# Patient Record
Sex: Female | Born: 1965 | Race: White | Hispanic: No | Smoking: Former smoker
Health system: Southern US, Community
[De-identification: ages and names within clinical notes are randomized; demographics above are authoritative.]

## PROBLEM LIST (undated history)

## (undated) DIAGNOSIS — F32A Depression, unspecified: Secondary | ICD-10-CM

## (undated) DIAGNOSIS — M1990 Unspecified osteoarthritis, unspecified site: Secondary | ICD-10-CM

## (undated) DIAGNOSIS — T4145XA Adverse effect of unspecified anesthetic, initial encounter: Secondary | ICD-10-CM

## (undated) DIAGNOSIS — I10 Essential (primary) hypertension: Secondary | ICD-10-CM

## (undated) DIAGNOSIS — Q185 Microstomia: Secondary | ICD-10-CM

## (undated) DIAGNOSIS — F419 Anxiety disorder, unspecified: Secondary | ICD-10-CM

## (undated) DIAGNOSIS — R21 Rash and other nonspecific skin eruption: Secondary | ICD-10-CM

## (undated) DIAGNOSIS — J3489 Other specified disorders of nose and nasal sinuses: Secondary | ICD-10-CM

## (undated) DIAGNOSIS — F909 Attention-deficit hyperactivity disorder, unspecified type: Secondary | ICD-10-CM

## (undated) DIAGNOSIS — Z8679 Personal history of other diseases of the circulatory system: Secondary | ICD-10-CM

## (undated) DIAGNOSIS — G2581 Restless legs syndrome: Secondary | ICD-10-CM

## (undated) DIAGNOSIS — T8859XA Other complications of anesthesia, initial encounter: Secondary | ICD-10-CM

## (undated) DIAGNOSIS — J320 Chronic maxillary sinusitis: Secondary | ICD-10-CM

## (undated) DIAGNOSIS — J322 Chronic ethmoidal sinusitis: Secondary | ICD-10-CM

## (undated) DIAGNOSIS — H548 Legal blindness, as defined in USA: Secondary | ICD-10-CM

## (undated) DIAGNOSIS — K5900 Constipation, unspecified: Secondary | ICD-10-CM

## (undated) DIAGNOSIS — K219 Gastro-esophageal reflux disease without esophagitis: Secondary | ICD-10-CM

## (undated) DIAGNOSIS — J343 Hypertrophy of nasal turbinates: Secondary | ICD-10-CM

## (undated) DIAGNOSIS — J342 Deviated nasal septum: Secondary | ICD-10-CM

## (undated) DIAGNOSIS — F329 Major depressive disorder, single episode, unspecified: Secondary | ICD-10-CM

## (undated) HISTORY — PX: INTRAOCULAR LENS INSERTION: SHX110

## (undated) HISTORY — PX: ABLATION ON ENDOMETRIOSIS: SHX5787

## (undated) HISTORY — PX: CATARACT EXTRACTION, BILATERAL: SHX1313

## (undated) HISTORY — PX: PARS PLANA VITRECTOMY: SHX2166

## (undated) HISTORY — PX: EYE SURGERY: SHX253

## (undated) HISTORY — DX: Essential (primary) hypertension: I10

## (undated) HISTORY — PX: TUBAL LIGATION: SHX77

## (undated) HISTORY — DX: Anxiety disorder, unspecified: F41.9

## (undated) HISTORY — DX: Gastro-esophageal reflux disease without esophagitis: K21.9

## (undated) HISTORY — DX: Attention-deficit hyperactivity disorder, unspecified type: F90.9

## (undated) HISTORY — PX: MEMBRANE PEEL: SHX5967

## (undated) HISTORY — PX: PLANTAR FASCIA RELEASE: SHX2239

---

## 2002-07-06 ENCOUNTER — Encounter: Payer: Self-pay | Admitting: Family Medicine

## 2002-07-06 ENCOUNTER — Ambulatory Visit (HOSPITAL_COMMUNITY): Admission: RE | Admit: 2002-07-06 | Discharge: 2002-07-06 | Payer: Self-pay | Admitting: Family Medicine

## 2003-01-20 ENCOUNTER — Encounter (HOSPITAL_COMMUNITY): Admission: RE | Admit: 2003-01-20 | Discharge: 2003-02-19 | Payer: Self-pay | Admitting: Cardiology

## 2003-12-26 ENCOUNTER — Ambulatory Visit (HOSPITAL_COMMUNITY): Admission: RE | Admit: 2003-12-26 | Discharge: 2003-12-26 | Payer: Self-pay | Admitting: Family Medicine

## 2004-01-31 ENCOUNTER — Ambulatory Visit (HOSPITAL_COMMUNITY): Admission: RE | Admit: 2004-01-31 | Discharge: 2004-01-31 | Payer: Self-pay | Admitting: Orthopedic Surgery

## 2004-03-22 ENCOUNTER — Encounter (HOSPITAL_COMMUNITY): Admission: RE | Admit: 2004-03-22 | Discharge: 2004-04-21 | Payer: Self-pay | Admitting: Neurosurgery

## 2004-07-05 ENCOUNTER — Inpatient Hospital Stay (HOSPITAL_COMMUNITY): Admission: RE | Admit: 2004-07-05 | Discharge: 2004-07-09 | Payer: Self-pay | Admitting: Neurosurgery

## 2004-07-05 HISTORY — PX: LUMBAR LAMINECTOMY: SHX95

## 2004-07-05 HISTORY — PX: LUMBAR FUSION: SHX111

## 2004-10-31 ENCOUNTER — Encounter (HOSPITAL_COMMUNITY): Admission: RE | Admit: 2004-10-31 | Discharge: 2004-11-30 | Payer: Self-pay | Admitting: Neurosurgery

## 2006-06-25 ENCOUNTER — Ambulatory Visit (HOSPITAL_COMMUNITY): Admission: RE | Admit: 2006-06-25 | Discharge: 2006-06-25 | Payer: Self-pay | Admitting: Family Medicine

## 2006-11-21 ENCOUNTER — Ambulatory Visit (HOSPITAL_COMMUNITY): Admission: RE | Admit: 2006-11-21 | Discharge: 2006-11-21 | Payer: Self-pay | Admitting: Family Medicine

## 2006-11-23 ENCOUNTER — Emergency Department (HOSPITAL_COMMUNITY): Admission: EM | Admit: 2006-11-23 | Discharge: 2006-11-23 | Payer: Self-pay | Admitting: Emergency Medicine

## 2007-04-01 ENCOUNTER — Ambulatory Visit (HOSPITAL_COMMUNITY): Admission: RE | Admit: 2007-04-01 | Discharge: 2007-04-01 | Payer: Self-pay | Admitting: Family Medicine

## 2007-04-07 ENCOUNTER — Ambulatory Visit (HOSPITAL_COMMUNITY): Admission: RE | Admit: 2007-04-07 | Discharge: 2007-04-07 | Payer: Self-pay | Admitting: Family Medicine

## 2007-04-13 ENCOUNTER — Ambulatory Visit: Payer: Self-pay | Admitting: Orthopedic Surgery

## 2007-09-23 ENCOUNTER — Ambulatory Visit (HOSPITAL_COMMUNITY): Admission: RE | Admit: 2007-09-23 | Discharge: 2007-09-23 | Payer: Self-pay | Admitting: Family Medicine

## 2007-10-27 ENCOUNTER — Ambulatory Visit (HOSPITAL_COMMUNITY): Admission: RE | Admit: 2007-10-27 | Discharge: 2007-10-27 | Payer: Self-pay | Admitting: Family Medicine

## 2008-08-25 ENCOUNTER — Emergency Department (HOSPITAL_COMMUNITY): Admission: EM | Admit: 2008-08-25 | Discharge: 2008-08-25 | Payer: Self-pay | Admitting: Emergency Medicine

## 2008-08-31 ENCOUNTER — Ambulatory Visit (HOSPITAL_COMMUNITY): Admission: RE | Admit: 2008-08-31 | Discharge: 2008-08-31 | Payer: Self-pay | Admitting: Family Medicine

## 2008-10-03 ENCOUNTER — Ambulatory Visit: Payer: Self-pay | Admitting: Gynecology

## 2008-10-03 ENCOUNTER — Other Ambulatory Visit: Admission: RE | Admit: 2008-10-03 | Discharge: 2008-10-03 | Payer: Self-pay | Admitting: Gynecology

## 2008-10-03 ENCOUNTER — Encounter: Payer: Self-pay | Admitting: Gynecology

## 2008-10-10 ENCOUNTER — Ambulatory Visit: Payer: Self-pay | Admitting: Gynecology

## 2008-10-10 ENCOUNTER — Encounter: Payer: Self-pay | Admitting: Gynecology

## 2008-11-10 ENCOUNTER — Ambulatory Visit: Payer: Self-pay | Admitting: Gynecology

## 2008-11-11 ENCOUNTER — Ambulatory Visit: Payer: Self-pay | Admitting: Gynecology

## 2008-12-30 ENCOUNTER — Ambulatory Visit: Payer: Self-pay | Admitting: Gynecology

## 2009-12-14 ENCOUNTER — Ambulatory Visit (HOSPITAL_COMMUNITY): Admission: RE | Admit: 2009-12-14 | Discharge: 2009-12-14 | Payer: Self-pay | Admitting: Family Medicine

## 2011-01-16 ENCOUNTER — Emergency Department (HOSPITAL_COMMUNITY)
Admission: EM | Admit: 2011-01-16 | Discharge: 2011-01-16 | Disposition: A | Discharge status: Home / Self Care | Payer: BC Managed Care – PPO | Attending: Emergency Medicine | Admitting: Emergency Medicine

## 2011-01-16 DIAGNOSIS — H169 Unspecified keratitis: Secondary | ICD-10-CM | POA: Insufficient documentation

## 2011-01-16 DIAGNOSIS — I10 Essential (primary) hypertension: Secondary | ICD-10-CM | POA: Insufficient documentation

## 2011-01-16 DIAGNOSIS — H5710 Ocular pain, unspecified eye: Secondary | ICD-10-CM | POA: Insufficient documentation

## 2011-03-15 NOTE — H&P (Signed)
NAME:  Sara Barnett, Sara Barnett                       ACCOUNT NO.:  [id number]   MEDICAL RECORD NO.:  [id number]                   PATIENT TYPE:  INP   LOCATION:  3006                                 FACILITY:  [facility]   PHYSICIAN:  Clydene Fake, M.D.               DATE OF BIRTH:  18-Feb-1966   DATE OF ADMISSION:  07/05/2004  DATE OF DISCHARGE:                                HISTORY & PHYSICAL   CHIEF COMPLAINT:  Back and right leg pain.   HISTORY:  The patient is a 45 year old right handed woman who has been  having back and right leg pain off and on for sometime but over the last few  months has been constant and more severe with pain radiating down behind the  knee towards the calf.  No numbness or weakness.  Activity makes this worse.  It is a lot worse in the morning.  Sitting makes things worse.  X-rays and  MRI were done showing grade 1-2 spondylolisthesis at L5-S1 that does move  between flexion and extension with some central stenosis due to biforaminal  narrowing, worse on the right side.  The patient was admitted for  decompression and fusion of the lumbar spine.   PAST MEDICAL HISTORY:  Significant for hypertension, no previous operations.   ALLERGIES:  No known drug allergies.   MEDICATIONS:  Diovan p.r.n.   SOCIAL HISTORY:  She smokes a pack a day, quit a couple of months ago, but  has an occasional cigarette.  Alcohol use socially.   REVIEW OF SYMPTOMS:  Otherwise, negative.   FAMILY HISTORY:  Noncontributory.   PHYSICAL EXAMINATION:  HEENT:  Within normal limits.  NECK:  Supple.  LUNGS:  Clear.  HEART:  Regular rate and rhythm.  ABDOMEN:  Soft, nontender.  EXTREMITIES:  Intact.  BACK AND NEUROLOGICAL:  Negative straight leg raising.  Strength intact.  Sensation intact.  Gait normal.  Reflexes symmetric.   ASSESSMENT AND PLAN:  Patient with unstable spondylolisthesis with back and  leg pain, patient is brought in for decompression and fusion of L5-S1.                                           Clydene Fake, M.D.   JRH/MEDQ  D:  07/05/2004  T:  07/05/2004  Job:  [id number]

## 2011-03-15 NOTE — Op Note (Signed)
Sara Barnett, Sara Barnett                       ACCOUNT NO.:  [id number]   MEDICAL RECORD NO.:  [id number]                   PATIENT TYPE:  INP   LOCATION:  2873                                 FACILITY:  [facility]   PHYSICIAN:  Clydene Fake, M.D.               DATE OF BIRTH:  1966/04/27   DATE OF PROCEDURE:  07/05/2004  DATE OF DISCHARGE:                                 OPERATIVE REPORT   PREOPERATIVE DIAGNOSES:  Unstable spondylolisthesis at L5-S1 with lateral  recess stenosis and a pars defect.   POSTOPERATIVE DIAGNOSES:  Unstable spondylolisthesis at L5-S1 with lateral  recess stenosis and a pars defect.   PROCEDURE:  Gill decompressive laminectomy at L5-S1 with posterior lumbar  interbody fusion at L5-S1 with branding and interbody cages at L5-S1,  nonsegmented pedicle screw fixation with medium screws at L5-S1, posterior  lateral fusion at L5-S1, autograft at same incision, allograft, Symphony.   SURGEON:  Clydene Fake, M.D.   ASSISTANT:  Payton Doughty, M.D.   ANESTHESIA:  General endotracheal tube anesthesia.   ESTIMATED BLOOD LOSS:  Was 200 mL.   BLOOD REPLACED:  None.   DRAINS:  None.   COMPLICATIONS:  None.   INDICATIONS FOR PROCEDURE:  The patient is a 45 year old woman who has been  having low back and right leg pain.  Over the last few months it has been  more constant.  MRI and x-rays done showing unstable spondylolisthesis at L5-  S1 and lateral recess stenosis, worse on the right.  She is brought in for a  decompression and fusion.   DESCRIPTION OF PROCEDURE:  The patient is brought to the operating room and  general anesthesia was induced.  The patient was placed in the prone  position on the Wilson frame with all pressure points padded.  The patient  was prepped and draped in a sterile fashion.  The __________ was injected  with 20 mL of 1% Lidocaine with epinephrine.  An incision was then made in  the midline lower lumbar sacral spine.  The incision  was taken down to the  fascia.  Hemostasis was obtained with the Bovie cauterization.  The fascia  was incised over the L5 spinous process.  Immediately we could tell the  spinous process was loose to the lateral pars defect.  A subperiosteal  dissection was done bilaterally, at the S1, L5 and L4 spinous processes and  lamina, up to the facets and the L5 transverse process and lateral sacrum  were exposed.  A self-retaining retractor was placed and fluoroscopy was  used to confirm our positioning.  A laminectomy and fasciectomy and pars  resection were then performed and all the bone removed.  It was chopped up  into small pieces, after which it was cleaned and saved for later use in  fusion.  Again, bilateral fasciectomizes were performed and the bilateral  pars were decompressed.  There was significant spondylitic  change beneath  the pars compression of the L5 roots, and this was all removed.  We  decompressed all the way up to the L5 pedicle, following the nerve root out  to make sure the L5 was decompressed along with the central canal and the S1  roots.  Hemostasis in the epidural spaces was obtained with bipolar  cauterization.  The disk space was explored and the disk space was incised  with a #15 blade and a diskectomy performed with pituitary rongeurs.  We  distracted the interspace up to 9 mm.  This reduced some of the  spondylolisthesis, and we started preparing the interspace for an interbody  fusion.  The various scrapers and broaches were used to prepare the  interspace, removing the cartilagineus end plate, and used curets to curet  out the interspace.  Our  holding distractor __________ until we packed  autograft bone that was mixed with the Symphony system for the platelet-rich  plasma, and we packed that into the interbody space.  We then tapped a 9 x 9  Browning cage into position.  The distractor was removed from the  ipsilateral side and the interbody space packed with  bone and then another 9  x 9 Browning cage tapped into place there.  Both were countersunk a couple  of millimeters.  Again explored the nerve root.  We had a good decompression  and better alignment of the spine.  The transverse process of the L5 facet  and the lateral sacrum were then decorticated with a high-speed drill.  _________ using the transverse process at the facet junction, and visual  inspection of the pedicle both at L5 and S1.  The pedicle was decorticated  with the high-speed drill.  A pedicle probe was placed on the pedicle using  fluoroscopy as a guide, and probed the pedicle with a small ball probe, and  entered the bony circumference, tapped the pedicle again, and checked the  whole anterior bony circumference around it, and then placed a pedicle  screw.  This was done bilaterally at L5 and S1.  The 45 mm screws were used  at L5, 40 mm screws in the right-sided S1 and a 3.5 mm screw used at the  left-sided S1.  Grafts were placed in the screw heads.  Locking nuts were  placed and tightened down at L5 and there was noted some slight compression  over the interspace.  Tightened down the S1 screw bilaterally.  Apical  fluoroscopic images were obtained and found to be in good position of the  cages and pedicle screws and rods, and good improved alignment of the spine.  Irrigation was used.  The nerve roots was again explored.  Gelfoam was  placed over the nerve roots to make sure that no bone graft would fall on  them and compress them and then the rest of the autograft and the allograft  bone that was placed at the Symphony system was then packed into the  posterolateral gutters for a posterolateral fusion at L5-S1 bilaterally.  The retractors were removed.  The fascia was closed with over and over  Vicryl interrupted sutures.  The subcutaneous tissue was closed with #0 and  #2-0 and #3-0 Vicryl interrupted sutures.  The skin was closed with Benzoin and Steri-Strips.  A  dressing was placed.  The patient was placed back into  the supine position.  The patient was sent to the recovery room in stable condition.  Clydene Fake, M.D.    JRH/MEDQ  D:  07/05/2004  T:  07/05/2004  Job:  [id number]

## 2011-03-15 NOTE — Discharge Summary (Signed)
Sara Barnett, SOW NO.:  [id number]   MEDICAL RECORD NO.:  [id number]          PATIENT TYPE:  INP   LOCATION:  3006                         FACILITY:  [facility]   PHYSICIAN:  Clydene Fake, M.D.  DATE OF BIRTH:  1966-06-21   DATE OF ADMISSION:  07/05/2004  DATE OF DISCHARGE:  07/09/2004                                 DISCHARGE SUMMARY   DIAGNOSES:  Unstable spondylolisthesis L5-S1, recess stenosis, pars defect.   DISCHARGE DIAGNOSES:  Unstable spondylolisthesis L5-S1, recess stenosis,  pars defect.   PROCEDURE:  Gill decompression L5-S1 with posterior lumbar interbody fusion  L5-S1 with interbody Brantigan cages and nonsegmented screw fixation with  Expedient  system at L5-S1 with posterior lumbar interbody fusion L5-S1 with  autograft allograft and Symphony.   REASON FOR ADMISSION:  The patient is a 45 year old woman who starting  having back and right leg pain for some time, over the last few months with  more severe pain.  Subsequently MRI was done showing __________ L5-S1 that  does move between flexion and extension causing stenosis and bi-foraminal  narrowing.  Patient brought in for decompression and fusion.   HOSPITAL COURSE:  The patient was admitted on the day of surgery and  underwent procedure above without complications.  Postoperatively, the  patient was transferred to recovery room and then to the floor where she was  doing well and started ambulating.  Due to some spasm in her legs and due to  some nausousness the first day or so, but she has started to increase her  activity on September 10.  PCA was discontinued, and she was started on p.o.  pain medications, continued to increase ambulation and treated spasm with  frequent Valium.  As things improved and she was ambulating well, incisions  clean, dry, and intact, starting to eat better, not nauseous, ambulating  well, the apt was discharged home on July 09, 2004, in stable   condition.   DISCHARGE MEDICATIONS:  Same as before hospitalization plus Percocet,  Valium, and Flexeril p.r.n. pain.   FOLLOW UP:  In my office in three to four weeks.   ACTIVITY:  No strenuous activity, brace when up.   DIET:  As tolerated.       JRH/MEDQ  D:  07/26/2004  T:  07/26/2004  Job:  [id number]

## 2011-07-29 LAB — DIFFERENTIAL
Monocytes Relative: 5
Neutrophils Relative %: 67

## 2011-07-29 LAB — ACETAMINOPHEN LEVEL: Acetaminophen (Tylenol), Serum: <10 — ABNORMAL LOW

## 2011-07-29 LAB — URINALYSIS, ROUTINE W REFLEX MICROSCOPIC
Glucose, UA: NEGATIVE
Ketones, ur: NEGATIVE
Leukocytes, UA: NEGATIVE
Nitrite: NEGATIVE
Specific Gravity, Urine: 1.025
Urobilinogen, UA: 0.2
pH: 6

## 2011-07-29 LAB — CBC
Hemoglobin: 14.6
MCV: 89.7
WBC: 5.9

## 2011-07-29 LAB — RAPID URINE DRUG SCREEN, HOSP PERFORMED
Cocaine: NOT DETECTED
Opiates: NOT DETECTED
Tetrahydrocannabinol: NOT DETECTED

## 2011-07-29 LAB — BASIC METABOLIC PANEL
Chloride: 106
GFR calc Af Amer: >60
Glucose, Bld: 101 — ABNORMAL HIGH

## 2011-07-29 LAB — URINE MICROSCOPIC-ADD ON

## 2011-07-29 LAB — SALICYLATE LEVEL: Salicylate Lvl: <4

## 2011-08-13 DIAGNOSIS — Z8719 Personal history of other diseases of the digestive system: Secondary | ICD-10-CM | POA: Insufficient documentation

## 2011-08-13 DIAGNOSIS — Z8669 Personal history of other diseases of the nervous system and sense organs: Secondary | ICD-10-CM | POA: Insufficient documentation

## 2011-08-13 DIAGNOSIS — I10 Essential (primary) hypertension: Secondary | ICD-10-CM | POA: Insufficient documentation

## 2011-09-26 DIAGNOSIS — H4050X0 Glaucoma secondary to other eye disorders, unspecified eye, stage unspecified: Secondary | ICD-10-CM | POA: Insufficient documentation

## 2011-09-30 DIAGNOSIS — H35359 Cystoid macular degeneration, unspecified eye: Secondary | ICD-10-CM | POA: Insufficient documentation

## 2011-10-05 ENCOUNTER — Ambulatory Visit (HOSPITAL_COMMUNITY)
Admission: RE | Admit: 2011-10-05 | Discharge: 2011-10-05 | Disposition: A | Discharge status: Home / Self Care | Payer: BC Managed Care – PPO | Source: Ambulatory Visit | Attending: Family Medicine | Admitting: Family Medicine

## 2011-10-05 ENCOUNTER — Other Ambulatory Visit (HOSPITAL_COMMUNITY): Payer: Self-pay | Admitting: Family Medicine

## 2011-10-05 DIAGNOSIS — R05 Cough: Secondary | ICD-10-CM | POA: Insufficient documentation

## 2011-10-05 DIAGNOSIS — R0602 Shortness of breath: Secondary | ICD-10-CM

## 2011-10-05 DIAGNOSIS — R635 Abnormal weight gain: Secondary | ICD-10-CM | POA: Insufficient documentation

## 2011-10-05 DIAGNOSIS — R059 Cough, unspecified: Secondary | ICD-10-CM | POA: Insufficient documentation

## 2011-11-13 DIAGNOSIS — H31401 Unspecified choroidal detachment, right eye: Secondary | ICD-10-CM | POA: Insufficient documentation

## 2011-12-18 ENCOUNTER — Emergency Department (HOSPITAL_COMMUNITY): Payer: Managed Care, Other (non HMO)

## 2011-12-18 ENCOUNTER — Other Ambulatory Visit: Payer: Self-pay

## 2011-12-18 ENCOUNTER — Emergency Department (HOSPITAL_COMMUNITY)
Admission: EM | Admit: 2011-12-18 | Discharge: 2011-12-18 | Disposition: A | Discharge status: Home / Self Care | Payer: Managed Care, Other (non HMO) | Attending: Emergency Medicine | Admitting: Emergency Medicine

## 2011-12-18 ENCOUNTER — Encounter (HOSPITAL_COMMUNITY): Payer: Self-pay | Admitting: *Deleted

## 2011-12-18 DIAGNOSIS — H4311 Vitreous hemorrhage, right eye: Secondary | ICD-10-CM

## 2011-12-18 DIAGNOSIS — H4310 Vitreous hemorrhage, unspecified eye: Secondary | ICD-10-CM | POA: Insufficient documentation

## 2011-12-18 DIAGNOSIS — R6884 Jaw pain: Secondary | ICD-10-CM | POA: Insufficient documentation

## 2011-12-18 DIAGNOSIS — R110 Nausea: Secondary | ICD-10-CM | POA: Insufficient documentation

## 2011-12-18 DIAGNOSIS — I10 Essential (primary) hypertension: Secondary | ICD-10-CM | POA: Insufficient documentation

## 2011-12-18 DIAGNOSIS — R079 Chest pain, unspecified: Secondary | ICD-10-CM | POA: Insufficient documentation

## 2011-12-18 HISTORY — DX: Essential (primary) hypertension: I10

## 2011-12-18 LAB — CBC
HCT: 46.2 % — ABNORMAL HIGH (ref 36.0–46.0)
MCH: 31 pg (ref 26.0–34.0)
MCHC: 34.4 g/dL (ref 30.0–36.0)
MCV: 90.1 fL (ref 78.0–100.0)
RDW: 13.1 % (ref 11.5–15.5)
WBC: 17.2 10*3/uL — ABNORMAL HIGH (ref 4.0–10.5)

## 2011-12-18 LAB — BASIC METABOLIC PANEL
BUN: 13 mg/dL (ref 6–23)
Calcium: 10.2 mg/dL (ref 8.4–10.5)
Chloride: 99 mEq/L (ref 96–112)
Creatinine, Ser: 0.72 mg/dL (ref 0.50–1.10)
GFR calc Af Amer: >90 mL/min (ref >90)

## 2011-12-18 MED ORDER — MORPHINE SULFATE 2 MG/ML IJ SOLN
2.0000 mg | Freq: Once | INTRAMUSCULAR | Status: AC
  Administered 2011-12-18: 2 mg via INTRAVENOUS
  Filled 2011-12-18: qty 1

## 2011-12-18 MED ORDER — ONDANSETRON HCL 4 MG/2ML IJ SOLN
4.0000 mg | Freq: Once | INTRAMUSCULAR | Status: AC
  Administered 2011-12-18: 4 mg via INTRAVENOUS
  Filled 2011-12-18: qty 2

## 2011-12-18 MED ORDER — FENTANYL CITRATE 0.05 MG/ML IJ SOLN
50.0000 ug | Freq: Once | INTRAMUSCULAR | Status: AC
  Administered 2011-12-18: 100 ug via INTRAVENOUS
  Filled 2011-12-18: qty 2

## 2011-12-18 MED ORDER — HYDROMORPHONE HCL PF 1 MG/ML IJ SOLN
1.0000 mg | Freq: Once | INTRAMUSCULAR | Status: AC
  Administered 2011-12-18: 1 mg via INTRAVENOUS
  Filled 2011-12-18: qty 1

## 2011-12-18 MED ORDER — HYDROMORPHONE HCL 2 MG PO TABS: 2.0000 mg | ORAL_TABLET | ORAL | Status: AC | PRN

## 2011-12-18 NOTE — ED Notes (Signed)
Pt states CP began this morning, radiating to right jaw. Pain has "eased" but never went away. Pain currently rated at 2.

## 2011-12-18 NOTE — Discharge Instructions (Signed)
Take dilaudid pills as needed for next two days until surgery. Please discuss this with your surgeon and anesthesiologist in case they want to change this medication. Please STOP taking percocet while you are dilaudid.

## 2011-12-18 NOTE — ED Notes (Signed)
Denies Chest pain, only c/o right eye pain 10/10

## 2011-12-18 NOTE — ED Provider Notes (Signed)
History     CSN: [id number]  Arrival date & time 12/18/11  1337   First MD Initiated Contact with Patient 12/18/11 1424      Chief Complaint  Patient presents with  . Chest Pain    (Consider location/radiation/quality/duration/timing/severity/associated sxs/prior treatment) HPI  Patient is a 46 year old woman with recent vitreal hemorrhage in her right eye who presents with central chest pain that began about 10:30 am as well as right upper jaw pain when she was sitting in her recliner. She reports that she took a tums and the pain improved and then returned about 12:30 in the afternoon with much greater severity. She reports nausea at this time but denies shortness of breath or dizziness. She denies pain related to food intake since she had eaten about 2 hours prior to each episode.  She denies any recent exertion since she has been told to avoid all lifting because of her eye bleed. She has severe right eye pain and she is to be taken to surgery on Friday. She was supposed to go last week but was sick with an URI with productive cough, fever and sweats. Her cough has improved and her sputum has cleared since last week. She was taking prednisone and tramadol/percocet daily for her eye pain but she did not take these today due to nausea.  Past Medical History  Diagnosis Date  . Hypertension   . Detached retina     Past Surgical History  Procedure Date  . Eye surgery   . Back surgery   . Foot surgery   . Cesarean section     No family history on file.  History  Substance Use Topics  . Smoking status: Former Games developer  . Smokeless tobacco: Not on file  . Alcohol Use: No    OB History    Grav Para Term Preterm Abortions TAB SAB Ect Mult Living                  Review of Systems  Allergies  Doxycycline; Statins; and Other  Home Medications  No current outpatient prescriptions on file.  BP 143/108  Pulse 83  Temp(Src) 98.3 F (36.8 C) (Oral)  Resp 19  SpO2 93%   LMP 12/11/2011  Physical Exam  Cardiovascular:        Date: 12/18/2011  Rate: 82  Rhythm: normal sinus rhythm  QRS Axis: normal  Intervals: normal  ST/T Wave abnormalities: normal  Conduction Disutrbances: none  Narrative Interpretation:   Old EKG Reviewed: No significant changes noted      General: obese woman resting in bed with sunglasses on in darkened room HEENT: PERRL, EOMI, no scleral icterus, right eye notably injected, pain with movements of right eye. Gums and teeth are normal appearing with no tenderness to palpation. Cardiac: RRR, no rubs, murmurs or gallops Pulm: clear to auscultation bilaterally, moving normal volumes of air Abd: soft, nontender, nondistended, BS present Ext: warm and well perfused, no pedal edema Neuro: alert and oriented X3, cranial nerves II-XII grossly intact  ED Course  Procedures (including critical care time)  Labs Reviewed  CBC - Abnormal; Notable for the following:    WBC 17.2 (*)    RBC 5.13 (*)    Hemoglobin 15.9 (*)    HCT 46.2 (*)    All other components within normal limits  POCT I-STAT TROPONIN I  BASIC METABOLIC PANEL   Dg Chest Portable 1 View  12/18/2011  *RADIOLOGY REPORT*  Clinical Data: 47 year old female  with chest pain.  PORTABLE CHEST - 1 VIEW  Comparison: 10/05/2011 and earlier.  Findings: AP portable upright view at 1353 hours.  Slightly lower lung volumes. Normal cardiac size and mediastinal contours. Visualized tracheal air column is within normal limits.  Allowing for portable technique, the lungs are clear.  No pneumothorax.  IMPRESSION: No acute cardiopulmonary abnormality.  Original Report Authenticated By: Harley Hallmark, M.D.     No diagnosis found.    MDM  Patient seems to have ongoing eye pain related to vitreal hemorrhage. Will give her nausea medication and her home pain medications and plan to discharge to home with eye surgery on Friday. Troponin negative and normal EKG. White count elevated but may  be related to prednisone use and recent URI.        Margorie John, MD 12/18/11 [phone]

## 2011-12-19 NOTE — ED Provider Notes (Signed)
I saw and evaluated the patient, reviewed the resident's note and I agree with the findings and plan.   .Face to face Exam:  General:  Awake HEENT:  Atraumatic Resp:  Normal effort Abd:  Nondistended Neuro:No focal weakness Lymph: No adenopathy   Nelia Shi, MD 12/19/11 2306

## 2012-04-24 ENCOUNTER — Other Ambulatory Visit (HOSPITAL_COMMUNITY): Payer: Self-pay | Admitting: Family Medicine

## 2012-04-24 ENCOUNTER — Ambulatory Visit (HOSPITAL_COMMUNITY)
Admission: RE | Admit: 2012-04-24 | Discharge: 2012-04-24 | Disposition: A | Discharge status: Home / Self Care | Payer: Managed Care, Other (non HMO) | Source: Ambulatory Visit | Attending: Family Medicine | Admitting: Family Medicine

## 2012-04-24 DIAGNOSIS — R0602 Shortness of breath: Secondary | ICD-10-CM | POA: Insufficient documentation

## 2013-01-28 ENCOUNTER — Encounter (HOSPITAL_COMMUNITY): Payer: Self-pay | Admitting: *Deleted

## 2013-01-28 ENCOUNTER — Emergency Department (HOSPITAL_COMMUNITY)
Admission: EM | Admit: 2013-01-28 | Discharge: 2013-01-28 | Disposition: A | Discharge status: Home / Self Care | Payer: Managed Care, Other (non HMO) | Attending: Emergency Medicine | Admitting: Emergency Medicine

## 2013-01-28 DIAGNOSIS — R5381 Other malaise: Secondary | ICD-10-CM | POA: Insufficient documentation

## 2013-01-28 DIAGNOSIS — Z8669 Personal history of other diseases of the nervous system and sense organs: Secondary | ICD-10-CM | POA: Insufficient documentation

## 2013-01-28 DIAGNOSIS — R079 Chest pain, unspecified: Secondary | ICD-10-CM | POA: Insufficient documentation

## 2013-01-28 DIAGNOSIS — Z79899 Other long term (current) drug therapy: Secondary | ICD-10-CM | POA: Insufficient documentation

## 2013-01-28 DIAGNOSIS — I10 Essential (primary) hypertension: Secondary | ICD-10-CM | POA: Insufficient documentation

## 2013-01-28 DIAGNOSIS — J3489 Other specified disorders of nose and nasal sinuses: Secondary | ICD-10-CM | POA: Insufficient documentation

## 2013-01-28 DIAGNOSIS — K122 Cellulitis and abscess of mouth: Secondary | ICD-10-CM

## 2013-01-28 DIAGNOSIS — Z87891 Personal history of nicotine dependence: Secondary | ICD-10-CM | POA: Insufficient documentation

## 2013-01-28 MED ORDER — METHYLPREDNISOLONE SODIUM SUCC 125 MG IJ SOLR
125.0000 mg | Freq: Once | INTRAMUSCULAR | Status: AC
  Administered 2013-01-28: 125 mg via INTRAMUSCULAR
  Filled 2013-01-28: qty 2

## 2013-01-28 NOTE — ED Notes (Signed)
Pt feels she has swelling in throat, Seen by Dr Phillips Odor 4/2 for rash on rt leg.  Started on augmentin and mupirocin ointment.  Speaks in whisper at times , then seems to speak normally within a few seconds.  Alert,

## 2013-01-28 NOTE — ED Provider Notes (Signed)
History     CSN: [id number]  Arrival date & time 01/28/13  1245   First MD Initiated Contact with Patient 01/28/13 1306      Chief Complaint  Patient presents with  . Sore Throat    (Consider location/radiation/quality/duration/timing/severity/associated sxs/prior treatment) HPI Comments: Sara Barnett is a 47 y.o. Female oresenting with a swelling sensation in the back of her throat with a mild sensation of dry irritation without frank pain.  She does have frequent problems with sinus pain and pressure and has had clear nasal discharge with nasal congestion,  But denies post nasal drip. Her symptoms started when she woke yesterday.  She has had increased fatigue,  But denies fevers, chills, cough, shortness of breath or difficulty eating or swallowing. She was seen by her pcp yesterday, treated for a right lower extremity skin infection with augmentin and bactroban and this symptom is improved.  She has taken no medicines for her throat irritation.     The history is provided by the patient.    Past Medical History  Diagnosis Date  . Hypertension   . Detached retina     Past Surgical History  Procedure Laterality Date  . Eye surgery    . Back surgery    . Foot surgery    . Cesarean section    . Uterine ablation      History reviewed. No pertinent family history.  History  Substance Use Topics  . Smoking status: Former Games developer  . Smokeless tobacco: Not on file  . Alcohol Use: No    OB History   Grav Para Term Preterm Abortions TAB SAB Ect Mult Living                  Review of Systems  Constitutional: Positive for fatigue. Negative for fever and chills.  HENT: Positive for sore throat and rhinorrhea. Negative for congestion, drooling, mouth sores, trouble swallowing, neck pain, voice change, postnasal drip and sinus pressure.   Eyes: Negative.   Respiratory: Negative for cough, chest tightness, shortness of breath, wheezing and stridor.   Cardiovascular:  Negative for chest pain.  Gastrointestinal: Negative for nausea and abdominal pain.  Genitourinary: Negative.   Musculoskeletal: Negative for joint swelling and arthralgias.  Skin: Positive for color change. Negative for wound.  Neurological: Negative for dizziness, weakness, light-headedness, numbness and headaches.  Psychiatric/Behavioral: Negative.     Allergies  Doxycycline; Statins; and Other  Home Medications   Current Outpatient Rx  Name  Route  Sig  Dispense  Refill  . albuterol (PROVENTIL HFA;VENTOLIN HFA) 108 (90 BASE) MCG/ACT inhaler   Inhalation   Inhale 2 puffs into the lungs every 6 (six) hours as needed for shortness of breath.          Marland Kitchen amLODipine-benazepril (LOTREL) 10-40 MG per capsule   Oral   Take 1 capsule by mouth daily.         . beclomethasone (QVAR) 80 MCG/ACT inhaler   Inhalation   Inhale 1 puff into the lungs as needed (shortness of breath).          . Dexlansoprazole (DEXILANT) 30 MG capsule   Oral   Take 30 mg by mouth daily.         . DULoxetine (CYMBALTA) 60 MG capsule   Oral   Take 60 mg by mouth daily.         Marland Kitchen moxifloxacin (VIGAMOX) 0.5 % ophthalmic solution   Right Eye   Place 1 drop into  the right eye 2 (two) times daily.         . prednisoLONE acetate (PRED FORTE) 1 % ophthalmic suspension   Right Eye   Place 1 drop into the right eye 4 (four) times daily.           BP 126/95  Pulse 76  Temp(Src) 97.7 F (36.5 C) (Oral)  Resp 18  Ht 5\' 3"  (1.6 m)  Wt 179 lb (81.194 kg)  BMI 31.72 kg/m2  SpO2 98%  Physical Exam  Constitutional: She is oriented to person, place, and time. She appears well-developed and well-nourished.  HENT:  Head: Normocephalic and atraumatic.  Right Ear: Tympanic membrane and ear canal normal.  Left Ear: Tympanic membrane and ear canal normal.  Nose: Rhinorrhea present. No mucosal edema.  Mouth/Throat: Uvula is midline, oropharynx is clear and moist and mucous membranes are normal.  Edematous present. No oropharyngeal exudate, posterior oropharyngeal edema, posterior oropharyngeal erythema or tonsillar abscesses.  Tip of uvula is mildly edematous and erythematous.    Eyes: Conjunctivae are normal.  Cardiovascular: Normal rate and normal heart sounds.   Pulmonary/Chest: Effort normal. No respiratory distress. She has no wheezes. She has no rales.  Abdominal: Soft. There is no tenderness.  Musculoskeletal: Normal range of motion.  Neurological: She is alert and oriented to person, place, and time.  Skin: Skin is warm and dry. No rash noted. There is erythema.  Faint erythema at medial right ankle (patient showed me a picture of it from ytd, and greatly improved today).  Psychiatric: She has a normal mood and affect.    ED Course  Procedures (including critical care time)  Labs Reviewed  RAPID STREP SCREEN   No results found.   1. Uvulitis       MDM  Pt encouraged to continue taking her augmentin.  She was given an IM injection of solumedrol for symptomatic relief. Encouraged f/u with pcp for worsened sx.  Patients labs and/or radiological studies were viewed and considered during the medical decision making and disposition process.          Burgess Amor, PA-C 01/28/13 1501  Burgess Amor, PA-C 01/28/13 1502

## 2013-01-28 NOTE — ED Provider Notes (Signed)
Medical screening examination/treatment/procedure(s) were performed by non-physician practitioner and as supervising physician I was immediately available for consultation/collaboration.   Obdulia Steier, MD 01/28/13 1600 

## 2013-01-28 NOTE — ED Notes (Addendum)
Pain center of chest, feels hot then cold.  Seen here today and dx with uvulitis,  When asked about pain, she says "I'm just cold , cold , cold"  Says that after she was d/c , she  Had a "bad headache , vomiited and had chest pain"   Says her feet and hands have been swollen. And were itching.

## 2013-01-28 NOTE — ED Notes (Signed)
Pt called x 1 from waiting room to go back to room - no answer.

## 2013-02-04 ENCOUNTER — Ambulatory Visit (INDEPENDENT_AMBULATORY_CARE_PROVIDER_SITE_OTHER): Payer: Managed Care, Other (non HMO) | Admitting: Otolaryngology

## 2013-02-04 DIAGNOSIS — J310 Chronic rhinitis: Secondary | ICD-10-CM

## 2013-02-04 DIAGNOSIS — J342 Deviated nasal septum: Secondary | ICD-10-CM

## 2013-02-04 DIAGNOSIS — J343 Hypertrophy of nasal turbinates: Secondary | ICD-10-CM

## 2013-02-04 DIAGNOSIS — H93299 Other abnormal auditory perceptions, unspecified ear: Secondary | ICD-10-CM

## 2013-03-18 ENCOUNTER — Ambulatory Visit (INDEPENDENT_AMBULATORY_CARE_PROVIDER_SITE_OTHER): Payer: Managed Care, Other (non HMO) | Admitting: Otolaryngology

## 2013-03-18 DIAGNOSIS — J310 Chronic rhinitis: Secondary | ICD-10-CM

## 2013-03-18 DIAGNOSIS — H6980 Other specified disorders of Eustachian tube, unspecified ear: Secondary | ICD-10-CM

## 2013-04-21 DIAGNOSIS — Z889 Allergy status to unspecified drugs, medicaments and biological substances status: Secondary | ICD-10-CM | POA: Insufficient documentation

## 2013-11-22 ENCOUNTER — Ambulatory Visit (HOSPITAL_COMMUNITY)
Admission: RE | Admit: 2013-11-22 | Discharge: 2013-11-22 | Disposition: A | Discharge status: Home / Self Care | Payer: BC Managed Care – PPO | Source: Ambulatory Visit | Attending: Family Medicine | Admitting: Family Medicine

## 2013-11-22 ENCOUNTER — Other Ambulatory Visit (HOSPITAL_COMMUNITY): Payer: Self-pay | Admitting: Family Medicine

## 2013-11-22 DIAGNOSIS — S6980XA Other specified injuries of unspecified wrist, hand and finger(s), initial encounter: Secondary | ICD-10-CM | POA: Insufficient documentation

## 2013-11-22 DIAGNOSIS — M79609 Pain in unspecified limb: Secondary | ICD-10-CM

## 2013-11-22 DIAGNOSIS — X500XXA Overexertion from strenuous movement or load, initial encounter: Secondary | ICD-10-CM | POA: Insufficient documentation

## 2013-11-22 DIAGNOSIS — S6990XA Unspecified injury of unspecified wrist, hand and finger(s), initial encounter: Principal | ICD-10-CM | POA: Insufficient documentation

## 2013-11-23 ENCOUNTER — Other Ambulatory Visit (HOSPITAL_COMMUNITY): Payer: Self-pay | Admitting: Medical

## 2014-05-05 ENCOUNTER — Emergency Department (HOSPITAL_COMMUNITY)
Admission: EM | Admit: 2014-05-05 | Discharge: 2014-05-06 | Disposition: A | Discharge status: Home / Self Care | Payer: BC Managed Care – PPO | Attending: Emergency Medicine | Admitting: Emergency Medicine

## 2014-05-05 ENCOUNTER — Encounter (HOSPITAL_COMMUNITY): Payer: Self-pay | Admitting: Emergency Medicine

## 2014-05-05 DIAGNOSIS — F3289 Other specified depressive episodes: Secondary | ICD-10-CM | POA: Insufficient documentation

## 2014-05-05 DIAGNOSIS — Z87891 Personal history of nicotine dependence: Secondary | ICD-10-CM | POA: Insufficient documentation

## 2014-05-05 DIAGNOSIS — I10 Essential (primary) hypertension: Secondary | ICD-10-CM | POA: Insufficient documentation

## 2014-05-05 DIAGNOSIS — J45909 Unspecified asthma, uncomplicated: Secondary | ICD-10-CM | POA: Insufficient documentation

## 2014-05-05 DIAGNOSIS — F32A Depression, unspecified: Secondary | ICD-10-CM

## 2014-05-05 DIAGNOSIS — Z792 Long term (current) use of antibiotics: Secondary | ICD-10-CM | POA: Insufficient documentation

## 2014-05-05 DIAGNOSIS — Z79899 Other long term (current) drug therapy: Secondary | ICD-10-CM | POA: Insufficient documentation

## 2014-05-05 DIAGNOSIS — Z8669 Personal history of other diseases of the nervous system and sense organs: Secondary | ICD-10-CM | POA: Insufficient documentation

## 2014-05-05 DIAGNOSIS — F329 Major depressive disorder, single episode, unspecified: Secondary | ICD-10-CM | POA: Insufficient documentation

## 2014-05-05 DIAGNOSIS — R45851 Suicidal ideations: Secondary | ICD-10-CM | POA: Insufficient documentation

## 2014-05-05 HISTORY — DX: Major depressive disorder, single episode, unspecified: F32.9

## 2014-05-05 HISTORY — DX: Depression, unspecified: F32.A

## 2014-05-05 LAB — BASIC METABOLIC PANEL
ANION GAP: 13 (ref 5–15)
BUN: 12 mg/dL (ref 6–23)
CHLORIDE: 101 meq/L (ref 96–112)
CO2: 27 mEq/L (ref 19–32)
Calcium: 9.5 mg/dL (ref 8.4–10.5)
Creatinine, Ser: 0.68 mg/dL (ref 0.50–1.10)
Glucose, Bld: 94 mg/dL (ref 70–99)
POTASSIUM: 4.6 meq/L (ref 3.7–5.3)
SODIUM: 141 meq/L (ref 137–147)

## 2014-05-05 LAB — CBC WITH DIFFERENTIAL/PLATELET
BASOS PCT: 1 % (ref 0–1)
Basophils Absolute: 0.1 10*3/uL (ref 0.0–0.1)
EOS ABS: 0.3 10*3/uL (ref 0.0–0.7)
Eosinophils Relative: 4 % (ref 0–5)
HCT: 45 % (ref 36.0–46.0)
HEMOGLOBIN: 15.7 g/dL — AB (ref 12.0–15.0)
Lymphocytes Relative: 26 % (ref 12–46)
Lymphs Abs: 1.7 10*3/uL (ref 0.7–4.0)
MCH: 31 pg (ref 26.0–34.0)
MCHC: 34.9 g/dL (ref 30.0–36.0)
MCV: 88.8 fL (ref 78.0–100.0)
MONOS PCT: 5 % (ref 3–12)
Monocytes Absolute: 0.3 10*3/uL (ref 0.1–1.0)
NEUTROS ABS: 4.3 10*3/uL (ref 1.7–7.7)
NEUTROS PCT: 64 % (ref 43–77)
PLATELETS: 289 10*3/uL (ref 150–400)
RBC: 5.07 MIL/uL (ref 3.87–5.11)
RDW: 12.9 % (ref 11.5–15.5)
WBC: 6.7 10*3/uL (ref 4.0–10.5)

## 2014-05-05 LAB — RAPID URINE DRUG SCREEN, HOSP PERFORMED
Amphetamines: NOT DETECTED
Barbiturates: NOT DETECTED
Benzodiazepines: NOT DETECTED
COCAINE: NOT DETECTED
OPIATES: NOT DETECTED
Tetrahydrocannabinol: NOT DETECTED

## 2014-05-05 LAB — ETHANOL

## 2014-05-05 MED ORDER — TIOTROPIUM BROMIDE MONOHYDRATE 18 MCG IN CAPS
18.0000 ug | ORAL_CAPSULE | Freq: Every day | RESPIRATORY_TRACT | Status: DC
  Administered 2014-05-06: 18 ug via RESPIRATORY_TRACT
  Filled 2014-05-05 (×2): qty 5

## 2014-05-05 MED ORDER — TIMOLOL MALEATE 0.5 % OP SOLN
1.0000 [drp] | Freq: Two times a day (BID) | OPHTHALMIC | Status: DC
  Administered 2014-05-06: 1 [drp] via OPHTHALMIC
  Filled 2014-05-05 (×2): qty 5

## 2014-05-05 MED ORDER — PREDNISOLONE ACETATE 1 % OP SUSP
1.0000 [drp] | Freq: Four times a day (QID) | OPHTHALMIC | Status: DC
  Administered 2014-05-06: 1 [drp] via OPHTHALMIC
  Filled 2014-05-05 (×2): qty 1

## 2014-05-05 MED ORDER — AMLODIPINE BESYLATE 5 MG PO TABS
10.0000 mg | ORAL_TABLET | Freq: Every day | ORAL | Status: DC
  Administered 2014-05-06: 10 mg via ORAL
  Filled 2014-05-05: qty 2

## 2014-05-05 MED ORDER — PANTOPRAZOLE SODIUM 40 MG PO TBEC
40.0000 mg | DELAYED_RELEASE_TABLET | Freq: Every day | ORAL | Status: DC
  Administered 2014-05-06 (×2): 40 mg via ORAL
  Filled 2014-05-05 (×2): qty 1

## 2014-05-05 MED ORDER — DORZOLAMIDE HCL-TIMOLOL MAL 2-0.5 % OP SOLN: 1.0000 [drp] | Freq: Two times a day (BID) | OPHTHALMIC | Status: DC

## 2014-05-05 MED ORDER — BENAZEPRIL HCL 10 MG PO TABS
40.0000 mg | ORAL_TABLET | Freq: Every day | ORAL | Status: DC
  Administered 2014-05-06: 40 mg via ORAL
  Filled 2014-05-05 (×3): qty 4
  Filled 2014-05-05 (×2): qty 2

## 2014-05-05 MED ORDER — ALBUTEROL SULFATE HFA 108 (90 BASE) MCG/ACT IN AERS: 2.0000 | INHALATION_SPRAY | Freq: Four times a day (QID) | RESPIRATORY_TRACT | Status: DC | PRN

## 2014-05-05 MED ORDER — DULOXETINE HCL 60 MG PO CPEP
60.0000 mg | ORAL_CAPSULE | Freq: Every day | ORAL | Status: DC
  Administered 2014-05-06: 60 mg via ORAL
  Filled 2014-05-05 (×4): qty 1

## 2014-05-05 MED ORDER — AMLODIPINE BESY-BENAZEPRIL HCL 10-40 MG PO CAPS: 1.0000 | ORAL_CAPSULE | Freq: Every day | ORAL | Status: DC

## 2014-05-05 MED ORDER — DORZOLAMIDE HCL 2 % OP SOLN
1.0000 [drp] | Freq: Two times a day (BID) | OPHTHALMIC | Status: DC
  Filled 2014-05-05 (×2): qty 10

## 2014-05-05 NOTE — ED Provider Notes (Signed)
CSN: [id number]     Arrival date & time 05/05/14  1412 History  This chart was scribed for Maudry Diego, MD by Elby Beck, ED Scribe. This patient was seen in room [facility] and the patient's care was started at 2:53 PM.    Chief Complaint  Patient presents with  . Depression    Patient is a 48 y.o. female presenting with altered mental status. The history is provided by the patient. No language interpreter was used.  Altered Mental Status Presenting symptoms comment:  Depression and suicidal ideations without a specific plan Severity:  Moderate Most recent episode:  Today Episode history:  Unable to specify Duration:  1 day Timing:  Unable to specify Progression:  Unchanged Associated symptoms: no abdominal pain, no hallucinations, no headaches, no rash and no seizures     HPI Comments: Sara Barnett is a 48 y.o. female with a history of depression who presents to the Emergency Department complaining of depression with SI onset today. She states that she became depresses after she found out today how little her disability check was going to be. She states that she is on disability for reasons related to vision. She states that she has had associated suicidal ideations, but she denies having a specific plan for killing herself. She denies any prior history of suicide attempts. She denies any other symptoms.    Past Medical History  Diagnosis Date  . Hypertension   . Detached retina   . Vision loss, bilateral   . Depression   . Asthma    Past Surgical History  Procedure Laterality Date  . Eye surgery    . Back surgery    . Foot surgery    . Cesarean section    . Uterine ablation     History reviewed. No pertinent family history. History  Substance Use Topics  . Smoking status: Former Research scientist (life sciences)  . Smokeless tobacco: Not on file  . Alcohol Use: No   OB History   Grav Para Term Preterm Abortions TAB SAB Ect Mult Living                 Review of Systems   Constitutional: Negative for appetite change and fatigue.  HENT: Negative for congestion, ear discharge and sinus pressure.   Eyes: Negative for discharge.  Respiratory: Negative for cough.   Cardiovascular: Negative for chest pain.  Gastrointestinal: Negative for abdominal pain and diarrhea.  Genitourinary: Negative for frequency and hematuria.  Musculoskeletal: Negative for back pain.  Skin: Negative for rash.  Neurological: Negative for seizures and headaches.  Psychiatric/Behavioral: Negative for hallucinations.     Allergies  Doxycycline; Statins; and Other  Home Medications   Prior to Admission medications   Medication Sig Start Date End Date Taking? Authorizing Provider  albuterol (PROVENTIL HFA;VENTOLIN HFA) 108 (90 BASE) MCG/ACT inhaler Inhale 2 puffs into the lungs every 6 (six) hours as needed for shortness of breath.     Historical Provider, MD  amLODipine-benazepril (LOTREL) 10-40 MG per capsule Take 1 capsule by mouth daily.    Historical Provider, MD  beclomethasone (QVAR) 80 MCG/ACT inhaler Inhale 1 puff into the lungs as needed (shortness of breath).     Historical Provider, MD  Dexlansoprazole (DEXILANT) 30 MG capsule Take 30 mg by mouth daily.    Historical Provider, MD  DULoxetine (CYMBALTA) 60 MG capsule Take 60 mg by mouth daily.    Historical Provider, MD  moxifloxacin (VIGAMOX) 0.5 % ophthalmic solution Place 1 drop into  the right eye 2 (two) times daily.    Historical Provider, MD  prednisoLONE acetate (PRED FORTE) 1 % ophthalmic suspension Place 1 drop into the right eye 4 (four) times daily.    Historical Provider, MD   Triage Vitals: BP 141/108  Pulse 71  Temp(Src) 98.5 F (36.9 C) (Oral)  Resp 18  Ht 5\' 2"  (1.575 m)  Wt 200 lb (90.719 kg)  BMI 36.57 kg/m2  SpO2 99%  Physical Exam  Constitutional: She is oriented to person, place, and time. She appears well-developed.  HENT:  Head: Normocephalic.  Eyes: Conjunctivae and EOM are normal. No  scleral icterus.  Neck: Neck supple. No thyromegaly present.  Cardiovascular: Normal rate and regular rhythm.  Exam reveals no gallop and no friction rub.   No murmur heard. Pulmonary/Chest: No stridor. She has no wheezes. She has no rales. She exhibits no tenderness.  Abdominal: She exhibits no distension. There is no tenderness. There is no rebound.  Musculoskeletal: Normal range of motion. She exhibits no edema.  Lymphadenopathy:    She has no cervical adenopathy.  Neurological: She is oriented to person, place, and time. She exhibits normal muscle tone. Coordination normal.  Skin: No rash noted. No erythema.  Psychiatric: She has a normal mood and affect. Her behavior is normal.    ED Course  Procedures (including critical care time)  DIAGNOSTIC STUDIES: Oxygen Saturation is 99% on RA, normal by my interpretation.    COORDINATION OF CARE: 2:56 PM- Pt advised of plan for treatment and pt agrees.  Labs Review Labs Reviewed - No data to display  Imaging Review No results found.   EKG Interpretation None      MDM   Final diagnoses:  None    Depression The chart was scribed for me under my direct supervision.  I personally performed the history, physical, and medical decision making and all procedures in the evaluation of this patient.Maudry Diego, MD 05/05/14 2103

## 2014-05-05 NOTE — ED Notes (Signed)
Security here to wand pt.

## 2014-05-05 NOTE — ED Notes (Signed)
Pt alert, oriented and cooperative.  Denies being suicidal.  Pt states "I am upset and am dealing with a lot, but I'm not going to kill myself."

## 2014-05-05 NOTE — ED Notes (Signed)
Pt awaiting on Telepsych assessment.

## 2014-05-05 NOTE — ED Notes (Signed)
Belongings locked up in locker .

## 2014-05-05 NOTE — ED Notes (Signed)
Pt reports she became depressed today after finding out how much her disability check would be for each month. Pt stated "a person would be better off dead than not have money to live off of". Pt having SI but no plan or means to carry out. PMH of depression.

## 2014-05-06 MED ORDER — ZOLPIDEM TARTRATE 5 MG PO TABS
5.0000 mg | ORAL_TABLET | Freq: Every evening | ORAL | Status: DC | PRN
  Administered 2014-05-06: 5 mg via ORAL

## 2014-05-06 MED ORDER — ZOLPIDEM TARTRATE 5 MG PO TABS
ORAL_TABLET | ORAL | Status: AC
  Filled 2014-05-06: qty 1

## 2014-05-06 NOTE — BH Assessment (Addendum)
Writer discussed clinicals with Waylan Boga, NP and discharge home was recommended. She also recommended outpatient follow up with a therapist. Writer informed patient's nurse-Rebecca of patient's disposition. Also, discussed disposition with EDP-Dr. Lacinda Axon.   [place] at [facility] Address: [address] [place], [place] [address]  [phone] Appointment: Tuesday, May 24, 2014 @ 2:45pm [facility])

## 2014-05-06 NOTE — ED Provider Notes (Signed)
No suicidal or homicidal ideation. No psychosis. We'll discharge. Discussed with behavioral health  Nat Christen, MD 05/06/14 1030

## 2014-05-06 NOTE — BH Assessment (Signed)
Tele Assessment Note   Sara Barnett is an 48 y.o. female that presents to [facility] for a mental health evaluation. Patient sts that she loss her vision approx. 30 yrs ago. She is unable to work and has applied for disability recently. Sts that her disability was approved and when she called the employment security office to inquire about the amount she would receive each month she became upset. Sts they told her she would receive $1600/month. Patient asked the person that she was speaking to on the phone, "Has anyone ever committed suicide after finding out how much they would receive each month for disability". Patient was told by the person on the phone, "Actually mam that's a good amount of money". This made patient increasingly upset. The person she was speaking to call the police and told them what she was suicidal and made comments about ending her life. Patient then received a call from a police officer telling her that she must go to the ER for an evaluation or she would be forced under IVC. She became concerned about caring for her 2 children (76 and 36 yrs old). She also reports that her daughter was accepted to a special school and she worries that she will be unable to afford the tuition fees.  Patient is depressed, sad, and worried that she would be unable to financially support herself and her children. Patient also began to think about how her blindness has impacted her freedom to drive and enjoy other "normal pleasures".   During today's tele assessment patient denies that she is suicidal. She reports that she is able to contract for safety. She denies hx of suicide attempts, gestures, and self mutilating behaviors. She denies HI and AVH's. Patient denies drug use but does report drinking 1 glass of wine per day. She has no current outpatient mental health providers. She also has a no hx of inpatient hospitalizations.  Axis I: Depressive Disorder NOS Axis II: Deferred Axis III:  Past  Medical History  Diagnosis Date  . Hypertension   . Detached retina   . Vision loss, bilateral   . Depression   . Asthma    Axis IV: other psychosocial or environmental problems, problems related to social environment, problems with access to health care services and problems with primary support group Axis V: 31-40 impairment in reality testing  Past Medical History:  Past Medical History  Diagnosis Date  . Hypertension   . Detached retina   . Vision loss, bilateral   . Depression   . Asthma     Past Surgical History  Procedure Laterality Date  . Eye surgery    . Back surgery    . Foot surgery    . Cesarean section    . Uterine ablation      Family History: History reviewed. No pertinent family history.  Social History:  reports that she has quit smoking. She does not have any smokeless tobacco history on file. She reports that she does not drink alcohol or use illicit drugs.  Additional Social History:  Alcohol / Drug Use Pain Medications: SEE MAR Prescriptions: SEE MAR Over the Counter: SEE MAR History of alcohol / drug use?: Yes Substance #1 Name of Substance 1: Alcohol (Wine) 1 - Age of First Use: teens 1 - Amount (size/oz): 1 glass of wine 1 - Frequency: daily (@ night) 1 - Duration: on-going  1 - Last Use / Amount: "last night" 05/05/2014  CIWA: CIWA-Ar BP: 123/85 mmHg Pulse Rate:  66 COWS:    Allergies:  Allergies  Allergen Reactions  . Doxycycline Shortness Of Breath  . Statins Swelling and Other (See Comments)    ALL OVER BODY PAIN  . Other     "Some kind of steroid"    Home Medications:  (Not in a hospital admission)  OB/GYN Status:  No LMP recorded. Patient has had an ablation.  General Assessment Data Location of Assessment: [facility] Is this a Tele or Face-to-Face Assessment?: Tele Assessment Is this an Initial Assessment or a Re-assessment for this encounter?: Initial Assessment Living Arrangements: Other (Comment);Children (pt lives in  home with 2 children ) Can pt return to current living arrangement?: Yes Admission Status: Voluntary Is patient capable of signing voluntary admission?: Yes Transfer from: [facility] Referral Source: Self/Family/Friend     [place] Living Arrangements: Other (Comment);Children (pt lives in home with 2 children ) Name of Psychiatrist:  (No psychiatrist ) Name of Therapist:  (No therapist)  Education Status Is patient currently in school?: No  Risk to self Suicidal Ideation: No-Not Currently/Within Last 6 Months Suicidal Intent: No-Not Currently/Within Last 6 Months Is patient at risk for suicide?: No Suicidal Plan?: No Access to Means: Yes Specify Access to Suicidal Means:  (pt has access to sharp objects) What has been your use of drugs/alcohol within the last 12 months?:  (patient reports daily alcohol use (wine)) Previous Attempts/Gestures: No How many times?: 0 Other Self Harm Risks:  (none reported ) Triggers for Past Attempts: Other (Comment) (no previous attempts or gestures ) Intentional Self Injurious Behavior: None Family Suicide History: No Recent stressful life event(s): Other (Comment);Financial Problems (pt upset about the amount she will receive for her disabilit) Persecutory voices/beliefs?: No Depression: Yes Depression Symptoms: Feeling angry/irritable;Feeling worthless/self pity;Loss of interest in usual pleasures;Guilt;Fatigue;Tearfulness;Isolating;Insomnia;Despondent Substance abuse history and/or treatment for substance abuse?: No Suicide prevention information given to non-admitted patients: Not applicable  Risk to Others Homicidal Ideation: No Thoughts of Harm to Others: No Current Homicidal Intent: No Current Homicidal Plan: No Access to Homicidal Means: No Identified Victim:  (n/a) History of harm to others?: No Assessment of Violence: None Noted Violent Behavior Description:  (patient calm and cooperative) Does patient have  access to weapons?: No Criminal Charges Pending?: No Does patient have a court date: No  Psychosis Hallucinations: None noted Delusions: None noted  Mental Status Report Appear/Hygiene: Other (Comment);Unremarkable Eye Contact: Other (Comment) (pt is blind) Motor Activity: Freedom of movement Speech: Logical/coherent Level of Consciousness: Alert Mood: Depressed Affect: Appropriate to circumstance Anxiety Level: Severe Thought Processes: Coherent;Relevant Judgement: Unimpaired Orientation: Person;Place;Time;Situation Obsessive Compulsive Thoughts/Behaviors: None  Cognitive Functioning Concentration: Decreased Memory: Recent Intact;Remote Intact IQ: Average Insight: Good Impulse Control: Good Appetite: Fair Weight Loss:  (none reported ) Weight Gain:  (none reported ) Sleep: No Change Total Hours of Sleep:  (varies ) Vegetative Symptoms: None  ADLScreening [facility] Assessment Services) Patient's cognitive ability adequate to safely complete daily activities?: Yes Patient able to express need for assistance with ADLs?: Yes Independently performs ADLs?: Yes (appropriate for developmental age)  Prior Inpatient Therapy Prior Inpatient Therapy: No Prior Therapy Dates:  (n/a) Prior Therapy Facilty/Provider(s):  (n/a) Reason for Treatment:  (n/a)  Prior Outpatient Therapy Prior Outpatient Therapy: No Prior Therapy Dates:  (n/a) Prior Therapy Facilty/Provider(s):  (n/a) Reason for Treatment:  (n/a)  ADL Screening (condition at time of admission) Patient's cognitive ability adequate to safely complete daily activities?: Yes Is the patient deaf or have difficulty hearing?: No Does  the patient have difficulty seeing, even when wearing glasses/contacts?: No Does the patient have difficulty concentrating, remembering, or making decisions?: Yes Patient able to express need for assistance with ADLs?: Yes Does the patient have difficulty dressing or bathing?: No Independently  performs ADLs?: Yes (appropriate for developmental age) Does the patient have difficulty walking or climbing stairs?: No Weakness of Legs: None Weakness of Arms/Hands: None  Home Assistive Devices/Equipment Home Assistive Devices/Equipment: None    Abuse/Neglect Assessment (Assessment to be complete while patient is alone) Physical Abuse: Denies Verbal Abuse: Denies Sexual Abuse: Denies Exploitation of patient/patient's resources: Denies Self-Neglect: Denies Values / Beliefs Cultural Requests During Hospitalization: None Spiritual Requests During Hospitalization: None     Nutrition Screen- [facility] Patient's home diet: Regular  Additional Information 1:1 In Past 12 Months?: Yes CIRT Risk: No Elopement Risk: No Does patient have medical clearance?: Yes     Disposition:  Disposition Initial Assessment Completed for this Encounter: Yes Disposition of Patient: Other dispositions (Pending recommendations from a provider)  Waldon Merl [facility] 05/06/2014 8:42 AM

## 2014-05-06 NOTE — ED Notes (Signed)
Spoke with Marlou Porch who relayed that pt would be discharged and sent as outpatient to [facility]. Health. [place] transferred to Dr. Lacinda Axon for notification of plan.

## 2014-05-06 NOTE — Discharge Instructions (Signed)
Follow-up with community mental health resources °

## 2014-05-06 NOTE — ED Notes (Signed)
telepsych completed 

## 2014-05-24 ENCOUNTER — Ambulatory Visit (INDEPENDENT_AMBULATORY_CARE_PROVIDER_SITE_OTHER): Payer: BC Managed Care – PPO | Admitting: Psychiatry

## 2014-05-24 ENCOUNTER — Encounter (HOSPITAL_COMMUNITY): Payer: Self-pay | Admitting: Psychiatry

## 2014-05-24 DIAGNOSIS — F3289 Other specified depressive episodes: Secondary | ICD-10-CM

## 2014-05-24 DIAGNOSIS — F329 Major depressive disorder, single episode, unspecified: Secondary | ICD-10-CM
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
## 2014-05-24 NOTE — Patient Instructions (Signed)
Discussed orally 

## 2014-06-10 ENCOUNTER — Ambulatory Visit (INDEPENDENT_AMBULATORY_CARE_PROVIDER_SITE_OTHER): Payer: BC Managed Care – PPO | Admitting: Psychiatry

## 2014-06-10 DIAGNOSIS — F329 Major depressive disorder, single episode, unspecified: Secondary | ICD-10-CM

## 2014-06-10 DIAGNOSIS — F3289 Other specified depressive episodes: Secondary | ICD-10-CM

## 2014-06-13 NOTE — Patient Instructions (Signed)
Discussed orally 

## 2014-06-13 NOTE — Progress Notes (Signed)
   THERAPIST PROGRESS NOTE  Session Time:  Friday 06/10/2014 2:10 PM - 2:55 PM  Participation Level: Active  Behavioral Response: CasualAlertDepressed  Type of Therapy: Individual Therapy  Treatment Goals addressed: Establish therapeutic alliance, improve ability to manage stress  Interventions: Supportive  Summary: Sara Barnett is a 48 y.o. female who was referred for follow up treatment upon discharge from [facility] ER due to patient experiencing symptoms of depression. Per patient's report, she recently was informed via a telephone conversation with disability representative the amount of her monthly benefit as patient has been approved for disability due to being legally blind. During the conversation, patient asked the representative if anyone had ever committed suicide over this as her monthly benefit amount is significantly lower than the amount of money she was making when working at [employer] where she was employed for 10 years. She said he then asked her if she would rather die and she responded she would rather die than live like this per her report. She says sheriff and a DSS worker came to her house shortly after phone call and told her she could go to the hospital voluntarily to be assessed or or she could be forced to go. Patient reports being assessed via telepsychiatry and then being discharged with instructions to follow up with this practice. Patient states she was not thinking about killing herself but was overwhelmed and disappointed. 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 She has had multiple surgeries on eye but remains legally blind. She says she was fired from her job at [employer] 1 1/2 years ago as due to being unable to call in to work when she had her last surgery but patient thinks she was really fired due to problems with her eye and changed functioning at work. Patient reports grief  and loss issues related to loss of sight , loss of job, and her decreased functioning. She also reports financial stress as she is a single parent with two daughters, ages 79 and 21.  Patient reports increased anxiety, irritability,  sleep difficulty, and continued depressed mood since last session. She has become more withdrawn and states not wanting anyone to call or visit. She shares more information today regarding her relationship with her family. She reports positive relationship with daughters, her father, and her brother. However, she reports difficult relationship with mother who is very critical of patient and has a pattern of making discouraging remarks to patient per her report. Patient reports poor self-esteem    Suicidal/Homicidal: No  Therapist Response: Therapist works with patient to process feelings, identify ways to improve self-care, and practice relaxation techniques using diaphragmatic breathing. Therapist also discusses referral to psychiatrist Dr. Harrington Challenger for medication evaluation.  Plan: Return again in 2 weeks. Patient agrees to schedule appointment with psychiatrist Dr. Harrington Challenger for medication evaluation.  Diagnosis: Axis I: Depressive Disorder NOS    Axis II: No diagnosis    Dahna Hattabaugh, LCSW 06/13/2014

## 2014-07-01 ENCOUNTER — Ambulatory Visit (INDEPENDENT_AMBULATORY_CARE_PROVIDER_SITE_OTHER): Payer: BC Managed Care – PPO | Admitting: Psychiatry

## 2014-07-01 DIAGNOSIS — F3289 Other specified depressive episodes: Secondary | ICD-10-CM

## 2014-07-01 DIAGNOSIS — F329 Major depressive disorder, single episode, unspecified: Secondary | ICD-10-CM

## 2014-07-01 NOTE — Patient Instructions (Signed)
Discussed orally 

## 2014-07-01 NOTE — Progress Notes (Addendum)
   THERAPIST PROGRESS NOTE  Session Time:  Friday 07/01/2014 2:00 PM - 2:55 PM  Participation Level: Active  Behavioral Response: CasualAlertAnxious  Type of Therapy: Individual Therapy  Treatment Goals addressed: improve ability to manage stress and anxiety  Interventions: CBT and Supportive  Summary: Sara Barnett is a 48 y.o. female who was referred for follow up treatment upon discharge from [facility] ER due to patient experiencing symptoms of depression. Per patient's report, she recently was informed via a telephone conversation with disability representative the amount of her monthly benefit as patient has been approved for disability due to being legally blind. During the conversation, patient asked the representative if anyone had ever committed suicide over this as her monthly benefit amount is significantly lower than the amount of money she was making when working at [employer] where she was employed for 10 years. She said he then asked her if she would rather die and she responded she would rather die than live like this per her report. She says sheriff and a DSS worker came to her house shortly after phone call and told her she could go to the hospital voluntarily to be assessed or or she could be forced to go. Patient reports being assessed via telepsychiatry and then being discharged with instructions to follow up with this practice. Patient states she was not thinking about killing herself but was overwhelmed and disappointed. 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 She has had multiple surgeries on eye but remains legally blind. She says she was fired from her job at [employer] 1 1/2 years ago as due to being unable to call in to work when she had her last surgery but patient thinks she was really fired due to problems with her eye and changed functioning at work. Patient reports grief and loss issues  related to loss of sight , loss of job, and her decreased functioning. She also reports financial stress as she is a single parent with two daughters, ages 73 and 68.   Patient states having ups and downs since last session but mainly ups. She has increased involvement in activity and has been walking on a school track field several days . She also recently attended an event at the gym where she used to go and was happy to see her workout group. She plans to resume going on a regular basis.  She states feeling much better. She reports having one to 2 down days. Patient reports continued poor self-acceptance and negative thinking patterns often assuming the worst. Patient also reports periods of nervousness and expresses willingness to see psychiatrist for medication evaluation.   Suicidal/Homicidal: No  Therapist Response:  Therapist works with patient to process feelings, begin to discuss goals, reinforce patient's involvement in activity, explore thought patterns and effects on mood and behavior, explore ways to identify positive aspects of her life, discuss referral to psychiatrist for medication evauluation  Plan: Return again in 1-2 weeks. Patient agrees to schedule appointment to see psychiatrist.  Diagnosis: Axis I: Depressive Disorder NOS    Axis II: No diagnosis    Boyd Buffalo, LCSW 07/01/2014

## 2014-07-14 ENCOUNTER — Ambulatory Visit (INDEPENDENT_AMBULATORY_CARE_PROVIDER_SITE_OTHER): Payer: BC Managed Care – PPO | Admitting: Psychiatry

## 2014-07-14 DIAGNOSIS — F329 Major depressive disorder, single episode, unspecified: Secondary | ICD-10-CM

## 2014-07-14 DIAGNOSIS — F3289 Other specified depressive episodes: Secondary | ICD-10-CM

## 2014-07-14 NOTE — Progress Notes (Signed)
   THERAPIST PROGRESS NOTE  Session Time: Thursday 07/14/2014 9:05 AM - 10:05 AM  Participation Level: Active  Behavioral Response: CasualAlertAnxious and Depressed  Type of Therapy: Individual Therapy  Treatment Goals addressed:  Improve ability to manage stress and decrease anxiety and excessive worry      Increase self-acceptance      Improve ability to initiate social interaction and increase social involvement      Improve assertiveness skills and ability to set/maintain boundaries  Interventions: CBT and Supportive  Summary: Sara Barnett is a 48 y.o. female who was referred for follow up treatment upon discharge from [facility] ER due to patient experiencing symptoms of depression. Per patient's report, she recently was informed via a telephone conversation with disability representative the amount of her monthly benefit as patient has been approved for disability due to being legally blind. During the conversation, patient asked the representative if anyone had ever committed suicide over this as her monthly benefit amount is significantly lower than the amount of money she was making when working at [employer] where she was employed for 10 years. She said he then asked her if she would rather die and she responded she would rather die than live like this per her report. She says sheriff and a DSS worker came to her house shortly after phone call and told her she could go to the hospital voluntarily to be assessed or or she could be forced to go. Patient reports being assessed via telepsychiatry and then being discharged with instructions to follow up with this practice. Patient states she was not thinking about killing herself but was overwhelmed and disappointed. 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 She has had multiple surgeries on eye but remains legally blind. She says she was fired from her job at  [employer] 1 1/2 years ago as due to being unable to call in to work when she had her last surgery but patient thinks she was really fired due to problems with her eye and changed functioning at work. Patient reports grief and loss issues related to loss of sight , loss of job, and her decreased functioning. She also reports financial stress as she is a single parent with two daughters, ages 10 and 52.  Patient reports continued anxiety and sometimes becoming angry very easily. She expresses frustration with mother and other family members who often come to patient's home without calling or respecting her privacy. Patient admits difficulty being assertive especially in the relationship with her mother who seems to have negative comments about patient's parenting per patient's report. Patient shares more information regarding relationship with mother during childhood, patient's interaction with classmates, and the effects on patient in childhood as well as her current functioning. Patient expresses desire to become more sociable but reports trust issues. Patient has continued to exercise by walking. She also has been listening to classical music which has helped to improve mood.  Suicidal/Homicidal: No  Therapist Response: Therapist works with patient to develop treatment plan, process feelings, review relaxation techniques.  Plan: Return again in 1 weeks. Patient agrees to practice focused breathing and complete anxiety log and bring to next session.  Diagnosis: Axis I: Depressive Disorder Nos    Axis II: Deferred    BYNUM,PEGGY, LCSW 07/14/2014

## 2014-07-14 NOTE — Patient Instructions (Signed)
Discussed orally 

## 2014-07-20 ENCOUNTER — Encounter (HOSPITAL_COMMUNITY): Payer: Self-pay | Admitting: Psychiatry

## 2014-07-20 ENCOUNTER — Ambulatory Visit (INDEPENDENT_AMBULATORY_CARE_PROVIDER_SITE_OTHER): Payer: BC Managed Care – PPO | Admitting: Psychiatry

## 2014-07-20 VITALS — BP 133/97 | HR 96 | Ht 62.0 in | Wt 192.6 lb

## 2014-07-20 DIAGNOSIS — F321 Major depressive disorder, single episode, moderate: Secondary | ICD-10-CM

## 2014-07-20 DIAGNOSIS — F329 Major depressive disorder, single episode, unspecified: Secondary | ICD-10-CM | POA: Insufficient documentation

## 2014-07-20 DIAGNOSIS — F3289 Other specified depressive episodes: Secondary | ICD-10-CM

## 2014-07-20 DIAGNOSIS — F411 Generalized anxiety disorder: Secondary | ICD-10-CM

## 2014-07-20 MED ORDER — CLONAZEPAM 0.5 MG PO TABS: ORAL_TABLET | ORAL | Status: DC

## 2014-07-20 MED ORDER — DULOXETINE HCL 60 MG PO CPEP: 60.0000 mg | ORAL_CAPSULE | Freq: Every day | ORAL | Status: DC
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
## 2014-07-22 ENCOUNTER — Ambulatory Visit (INDEPENDENT_AMBULATORY_CARE_PROVIDER_SITE_OTHER): Payer: BC Managed Care – PPO | Admitting: Psychiatry

## 2014-07-22 DIAGNOSIS — F321 Major depressive disorder, single episode, moderate: Secondary | ICD-10-CM

## 2014-07-22 NOTE — Patient Instructions (Signed)
Discussed orally 

## 2014-07-22 NOTE — Progress Notes (Signed)
   THERAPIST PROGRESS NOTE  Session Time: Friday 07/22/2014 2:00 PM - 2:50 PM  Participation Level: Active  Behavioral Response: Well GroomedAlertAnxious/ less depressed  Type of Therapy: Individual Therapy  Treatment Goals addressed:  Improve ability to manage stress and decrease anxiety and excessive worry       Improve assertiveness skills and ability to set/maintain boundaries  Interventions: CBT and Supportive  Summary: Sara Barnett is a 48 y.o. female who presents was referred for follow up treatment upon discharge from [facility] ER due to patient experiencing symptoms of depression. Per patient's report, she recently was informed via a telephone conversation with disability representative the amount of her monthly benefit as patient has been approved for disability due to being legally blind. During the conversation, patient asked the representative if anyone had ever committed suicide over this as her monthly benefit amount is significantly lower than the amount of money she was making when working at [employer] where she was employed for 10 years. She said he then asked her if she would rather die and she responded she would rather die than live like this per her report. She says sheriff and a DSS worker came to her house shortly after phone call and told her she could go to the hospital voluntarily to be assessed or or she could be forced to go. Patient reports being assessed via telepsychiatry and then being discharged with instructions to follow up with this practice. Patient states she was not thinking about killing herself but was overwhelmed and disappointed. 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 She has had multiple surgeries on eye but remains legally blind. She says she was fired from her job at [employer] 1 1/2 years ago as due to being unable to call in to work when she had her last surgery but  patient thinks she was really fired due to problems with her eye and changed functioning at work. Patient reports grief and loss issues related to loss of sight , loss of job, and her decreased functioning. She also reports financial stress as she is a single parent with two daughters, ages 17 and 55.   Patient states doing pretty good since last session. She reports a couple of incidents of increased anxiety and anger. She continues to express frustration and resentment regarding mother's behavior including sharing patient's personal business. Patient continues to have difficulty being assertive with mother.      Suicidal/Homicidal: No  Therapist Response: Therapist works with patient to process feelings, identify triggers of anxiety/anger, identify thought pattern and effects on mood and behavior, discuss assertive behavior versus aggressive behavior, identify relaxation techniques.  Plan: Return again in 2 weeks.  Diagnosis: Axis I: Major Depression, single episode    Axis II: No diagnosis    BYNUM,PEGGY, LCSW 07/22/2014

## 2014-08-03 ENCOUNTER — Ambulatory Visit (INDEPENDENT_AMBULATORY_CARE_PROVIDER_SITE_OTHER): Payer: BC Managed Care – PPO | Admitting: Psychiatry

## 2014-08-03 DIAGNOSIS — F321 Major depressive disorder, single episode, moderate: Secondary | ICD-10-CM

## 2014-08-03 NOTE — Patient Instructions (Signed)
Discussed orally 

## 2014-08-03 NOTE — Progress Notes (Signed)
   THERAPIST PROGRESS NOTE  Session Time: Wednesday 08/03/2014 2:10 PM - 3:05 PM  Participation Level: Active  Behavioral Response: CasualAlertAngry and Anxious  Type of Therapy: Individual Therapy   Treatment Goals addressed: Improve assertiveness skills and ability to set/maintain boundaries  Interventions: CBT and Supportive  Summary: Sara Barnett is a 48 y.o. female who was referred for follow up treatment upon discharge from [facility] ER due to patient experiencing symptoms of depression. Per patient's report, she recently was informed via a telephone conversation with disability representative the amount of her monthly benefit as patient has been approved for disability due to being legally blind. During the conversation, patient asked the representative if anyone had ever committed suicide over this as her monthly benefit amount is significantly lower than the amount of money she was making when working at [employer] where she was employed for 10 years. She said he then asked her if she would rather die and she responded she would rather die than live like this per her report. She says sheriff and a DSS worker came to her house shortly after phone call and told her she could go to the hospital voluntarily to be assessed or or she could be forced to go. Patient reports being assessed via telepsychiatry and then being discharged with instructions to follow up with this practice. Patient states she was not thinking about killing herself but was overwhelmed and disappointed. 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 She has had multiple surgeries on eye but remains legally blind. She says she was fired from her job at [employer] 1 1/2 years ago as due to being unable to call in to work when she had her last surgery but patient thinks she was really fired due to problems with her eye and changed functioning at work.  Patient reports grief and loss issues related to loss of sight , loss of job, and her decreased functioning. She also reports financial stress as she is a single parent with two daughters, ages 55 and 79  Patient reports continued stress, frustration, and anxiety particularly related to interaction with her mother since last session. She reports having conflict with mother this morning and trying to be assertive. However, she admits continued difficulty with setting boundaries with mother. She fears she will feel guilty if something bad happens to mother if she tells mother no. Patient has thought pattern of not wanting to hurt anyone's feelings. She cites other relationships including those with friends where she has difficulty setting boundares.   Suicidal/Homicidal: No  Therapist Response: Therapist works with patient to process feelings, identify connection between thoughts, mood, and behavior, identify thoughts that block and thoughts that help effective assertion.  Plan: Return again in 2 weeks.  Diagnosis: Axis I: MDD, single episode    Axis II: No diagnosis    BYNUM,PEGGY, LCSW 08/03/2014

## 2014-08-08 ENCOUNTER — Telehealth (HOSPITAL_COMMUNITY): Payer: Self-pay | Admitting: *Deleted

## 2014-08-09 NOTE — Telephone Encounter (Signed)
noted 

## 2014-08-16 ENCOUNTER — Ambulatory Visit (HOSPITAL_COMMUNITY): Payer: Self-pay | Admitting: Psychiatry

## 2014-08-17 ENCOUNTER — Telehealth (HOSPITAL_COMMUNITY): Payer: Self-pay | Admitting: *Deleted

## 2014-08-17 ENCOUNTER — Ambulatory Visit (HOSPITAL_COMMUNITY): Payer: Self-pay | Admitting: Psychiatry

## 2014-08-31 ENCOUNTER — Ambulatory Visit (HOSPITAL_COMMUNITY): Payer: Self-pay | Admitting: Psychiatry

## 2014-12-02 ENCOUNTER — Other Ambulatory Visit (HOSPITAL_COMMUNITY): Payer: Self-pay | Admitting: Psychiatry

## 2014-12-21 ENCOUNTER — Telehealth (HOSPITAL_COMMUNITY): Payer: Self-pay | Admitting: *Deleted

## 2014-12-21 ENCOUNTER — Other Ambulatory Visit (HOSPITAL_COMMUNITY): Payer: Self-pay | Admitting: Psychiatry

## 2014-12-21 MED ORDER — DULOXETINE HCL 60 MG PO CPEP: 60.0000 mg | ORAL_CAPSULE | Freq: Every day | ORAL | Status: DC

## 2014-12-21 NOTE — Telephone Encounter (Signed)
30 day supply sent in. Tell her she will need to make it to appointment

## 2014-12-21 NOTE — Telephone Encounter (Signed)
Pt pharmacy requesting pt Cymbalta. Pt last appt was 07-20-14. Pt medication was last filled 07-20-14 with 30 tablets 2 refills. Called pt to make appt and she is scheduled to come in 12-23-14. Per pt, she will try to make it to this f/u appt.

## 2014-12-23 ENCOUNTER — Encounter (HOSPITAL_COMMUNITY): Payer: Self-pay | Admitting: Psychiatry

## 2014-12-23 ENCOUNTER — Ambulatory Visit (INDEPENDENT_AMBULATORY_CARE_PROVIDER_SITE_OTHER): Payer: BLUE CROSS/BLUE SHIELD | Admitting: Psychiatry

## 2014-12-23 VITALS — BP 128/76 | HR 84 | Ht 62.0 in | Wt 193.0 lb

## 2014-12-23 DIAGNOSIS — F411 Generalized anxiety disorder: Secondary | ICD-10-CM

## 2014-12-23 DIAGNOSIS — F321 Major depressive disorder, single episode, moderate: Secondary | ICD-10-CM

## 2014-12-23 MED ORDER — DULOXETINE HCL 60 MG PO CPEP: 60.0000 mg | ORAL_CAPSULE | Freq: Every day | ORAL | Status: DC

## 2014-12-23 MED ORDER — CLONAZEPAM 0.5 MG PO TABS: ORAL_TABLET | ORAL | Status: DC

## 2014-12-23 NOTE — Telephone Encounter (Signed)
Pt set up appt for f/u.
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
## 2015-01-12 ENCOUNTER — Ambulatory Visit (INDEPENDENT_AMBULATORY_CARE_PROVIDER_SITE_OTHER): Payer: BLUE CROSS/BLUE SHIELD | Admitting: Psychiatry

## 2015-01-12 ENCOUNTER — Telehealth (HOSPITAL_COMMUNITY): Payer: Self-pay | Admitting: *Deleted

## 2015-01-12 DIAGNOSIS — F321 Major depressive disorder, single episode, moderate: Secondary | ICD-10-CM | POA: Diagnosis not present

## 2015-01-12 NOTE — Patient Instructions (Signed)
Discussed orally 
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
## 2015-01-12 NOTE — Telephone Encounter (Signed)
Pt came into office to pick up her script that she left when she had an appointment wih Dr. Harrington Challenger. Pt D/L number is [id number]. Pt agreed with script.

## 2015-02-01 ENCOUNTER — Ambulatory Visit (INDEPENDENT_AMBULATORY_CARE_PROVIDER_SITE_OTHER): Payer: BLUE CROSS/BLUE SHIELD | Admitting: Psychiatry

## 2015-02-01 DIAGNOSIS — F321 Major depressive disorder, single episode, moderate: Secondary | ICD-10-CM | POA: Diagnosis not present

## 2015-02-01 NOTE — Progress Notes (Signed)
THERAPIST PROGRESS NOTE  Session Time: Wednesday 02/01/2015 2:10 PM - 3:05 PM  Participation Level: Active  Behavioral Response: CasualAlert/ Angry/Anxious  Type of Therapy: Individual Therapy   Treatment Goals addressed: Increase self-acceptance  Interventions: CBT and Supportive  Summary: Sara Barnett is a 49 y.o. female who was referred for follow up treatment upon discharge from [facility] ER due to patient experiencing symptoms of depression. Per patient's report, she recently was informed via a telephone conversation with disability representative the amount of her monthly benefit as patient has been approved for disability due to being legally blind. During the conversation, patient asked the representative if anyone had ever committed suicide over this as her monthly benefit amount is significantly lower than the amount of money she was making when working at [employer] where she was employed for 10 years. She said he then asked her if she would rather die and she responded she would rather die than live like this per her report. She says sheriff and a DSS worker came to her house shortly after phone call and told her she could go to the hospital voluntarily to be assessed or or she could be forced to go. Patient reports being assessed via telepsychiatry and then being discharged with instructions to follow up with this practice. Patient states she was not thinking about killing herself but was overwhelmed and disappointed. 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 She has had multiple surgeries on eye but remains legally blind. She says she was fired from her job at [employer] 1 1/2 years ago as due to being unable to call in to work when she had her last surgery but patient thinks she was really fired due to problems with her eye and changed functioning at work. Patient reports grief and loss issues  related to loss of sight , loss of job, and her decreased functioning. She also reports financial stress as she is a single parent with two daughters, ages 11 and 87  Patient  reports stress related to conversation with father just prior to her appointment today. Per patient's report, her father informed her that her mother is going to want to know where her father took patient for her appointment. Patient's mother is not aware patient is attending therapy. Patient fears father will tell mother about appointment and mother will share this information with other people. She also reports anxiety and stress related to recent contact from man she was dating. She expresses ambivalent feelings about the relationship as she values commitment but is not clear about his values. She has continued self care efforts regarding nutrition and exercise. Patient is pleased she has remained involved in activities such as doing activities with her children, attending church, and going out with her friends. However, she remains sad and frustrated regarding her vision. She has unresolved grief and loss issues regarding related to her changed functioning. She also is having adjustment issues related to aging as she is unable to things she did when she was younger. She states sometimes feeling as though she is running out of time as she has not done some of the things she thought she would have done by this time in her life.   Suicidal/Homicidal: No  Therapist Response: Therapist works with patient to process feelings, encourage patient to maintain self-care, began to identify grief and loss issues related to loss of vision and aging, discuss  boundary issues in the relationship with her mother and her friend  Plan: Return again in 2 weeks.  Diagnosis: Axis I: MDD, single episode    Axis II: No diagnosis    Lekesha Claw, LCSW 02/01/2015

## 2015-02-01 NOTE — Patient Instructions (Signed)
Discussed orally 

## 2015-02-15 ENCOUNTER — Ambulatory Visit (INDEPENDENT_AMBULATORY_CARE_PROVIDER_SITE_OTHER): Payer: BLUE CROSS/BLUE SHIELD | Admitting: Psychiatry

## 2015-02-15 DIAGNOSIS — F321 Major depressive disorder, single episode, moderate: Secondary | ICD-10-CM | POA: Diagnosis not present

## 2015-02-15 NOTE — Progress Notes (Signed)
      THERAPIST PROGRESS NOTE  Session Time: Wednesday 02/15/2015 2:15 PM - 3:10 PM  Participation Level: Active  Behavioral Response: CasualAlert/ depressed  Type of Therapy: Individual Therapy   Treatment Goals addressed: Increase self-acceptance  Interventions: CBT and Supportive  Summary: Sara Barnett is a 49 y.o. female who was referred for follow up treatment upon discharge from [facility] ER due to patient experiencing symptoms of depression. Per patient's report, she recently was informed via a telephone conversation with disability representative the amount of her monthly benefit as patient has been approved for disability due to being legally blind. During the conversation, patient asked the representative if anyone had ever committed suicide over this as her monthly benefit amount is significantly lower than the amount of money she was making when working at [employer] where she was employed for 10 years. She said he then asked her if she would rather die and she responded she would rather die than live like this per her report. She says sheriff and a DSS worker came to her house shortly after phone call and told her she could go to the hospital voluntarily to be assessed or or she could be forced to go. Patient reports being assessed via telepsychiatry and then being discharged with instructions to follow up with this practice. Patient states she was not thinking about killing herself but was overwhelmed and disappointed. 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 She has had multiple surgeries on eye but remains legally blind. She says she was fired from her job at [employer] 1 1/2 years ago as due to being unable to call in to work when she had her last surgery but patient thinks she was really fired due to problems with her eye and changed functioning at work. Patient reports grief and loss issues  related to loss of sight , loss of job, and her decreased functioning. She also reports financial stress as she is a single parent with two daughters, ages 82 and 43  Patient  reports increased fatigue and poor motivation in the last 3 days. She began to notice these symptoms after starting a new pain medication prescribed by her doctor. Patient plans to contact Dr. this afternoon. Patient reports stress related to financial issues. She is on a fixed income and reports difficulty covering expenses. She is particularly stressed today as she is unable to purchase a gift for her daughter whose birthday is next week. She also expresses sadness about being unable to provide her daughters with items and activities like she was able to provide weekend she worked before losing vision. She reports stress related to a recent incident involving her cousin and her mother. This triggered thoughts and memories of mother negatively comparing patient to her cousin.    Suicidal/Homicidal: No  Therapist Response: Therapist works with patient to process feelings, continued to discuss grief and loss issues related to loss of vision, began to identify patient's thought patterns and effects on mood and behavior, identify other ways in which patient could celebrate her daughter's birthday, began to develop treatment plan  Plan: Return again in 2 weeks.  Diagnosis: Axis I: MDD, single episode    Axis II: No diagnosis    Soha Thorup, LCSW 02/15/2015

## 2015-02-15 NOTE — Patient Instructions (Signed)
Discussed orally 

## 2015-03-03 ENCOUNTER — Ambulatory Visit (INDEPENDENT_AMBULATORY_CARE_PROVIDER_SITE_OTHER): Payer: BLUE CROSS/BLUE SHIELD | Admitting: Psychiatry

## 2015-03-03 DIAGNOSIS — F321 Major depressive disorder, single episode, moderate: Secondary | ICD-10-CM | POA: Diagnosis not present

## 2015-03-03 NOTE — Patient Instructions (Signed)
Discussed orally 

## 2015-03-03 NOTE — Progress Notes (Signed)
       THERAPIST PROGRESS NOTE  Session Time: Friday 03/03/2015 3:10 PM - 3:55 PM  Participation Level: Active  Behavioral Response: CasualAlert/ depressed  Type of Therapy: Individual Therapy   Treatment Goals addressed: Increase self-acceptance  Interventions: CBT and Supportive  Summary: Sara Barnett is a 49 y.o. female who was referred for follow up treatment upon discharge from [facility] due to patient experiencing symptoms of depression. Per patient's report, she recently was informed via a telephone conversation with disability representative the amount of her monthly benefit as patient has been approved for disability due to being legally blind. During the conversation, patient asked the representative if anyone had ever committed suicide over this as her monthly benefit amount is significantly lower than the amount of money she was making when working at [employer] where she was employed for 10 years. She said he then asked her if she would rather die and she responded she would rather die than live like this per her report. She says sheriff and a DSS worker came to her house shortly after phone call and told her she could go to the hospital voluntarily to be assessed or or she could be forced to go. Patient reports being assessed via telepsychiatry and then being discharged with instructions to follow up with this practice. Patient states she was not thinking about killing herself but was overwhelmed and disappointed. 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 She has had multiple surgeries on eye but remains legally blind. She says she was fired from her job at [employer] 1 1/2 years ago as due to being unable to call in to work when she had her last surgery but patient thinks she was really fired due to problems with her eye and changed functioning at work. Patient reports grief and loss issues related  to loss of sight , loss of job, and her decreased functioning. She also reports financial stress as she is a single parent with two daughters, ages 51 and 59  Patient has been less depressed since last session but reports being disappointed yesterday when doctors at [facility] informed her there is nothing else than can be done to repair her eye at this time. She reports accepting that her eye is tired but states she still has hope and faith that something may be done eventually to repair her eye. She expresses continued grief and loss issues related to vision loss and her changed functioning. She expresses frustration she can't drive and will not be able to assist daughters as they learn to drive. She reports her aunt by marriage died earlier this week. She expresses anxiety regarding attending family visitation as she is concerned she may not recognize family members she hadn't seen in a long time due to her vision impairment. She also reports not wanting to be around her mother during the visitation. She reports continued negative feelings about mother. She continues to set and maintain boundaries with mother.   Suicidal/Homicidal: No  Therapist Response: Therapist works with patient to process feelings, continued to discuss grief and loss issues related to loss of vision, identify alternative  ways she can show her support and respect to deceased aunt's familly  Plan: Return again in 2 weeks.  Diagnosis: Axis I: MDD, single episode    Axis II: No diagnosis    BYNUM,PEGGY, LCSW 03/03/2015

## 2015-03-23 ENCOUNTER — Encounter (HOSPITAL_COMMUNITY): Payer: Self-pay | Admitting: Psychiatry

## 2015-03-23 ENCOUNTER — Ambulatory Visit (INDEPENDENT_AMBULATORY_CARE_PROVIDER_SITE_OTHER): Payer: BLUE CROSS/BLUE SHIELD | Admitting: Psychiatry

## 2015-03-23 VITALS — BP 131/87 | HR 83 | Ht 62.0 in | Wt 192.2 lb

## 2015-03-23 DIAGNOSIS — F411 Generalized anxiety disorder: Secondary | ICD-10-CM

## 2015-03-23 DIAGNOSIS — F329 Major depressive disorder, single episode, unspecified: Secondary | ICD-10-CM | POA: Diagnosis not present

## 2015-03-23 DIAGNOSIS — F321 Major depressive disorder, single episode, moderate: Secondary | ICD-10-CM

## 2015-03-23 MED ORDER — CLONAZEPAM 0.5 MG PO TABS: ORAL_TABLET | ORAL | Status: DC

## 2015-03-23 MED ORDER — DULOXETINE HCL 60 MG PO CPEP: 60.0000 mg | ORAL_CAPSULE | Freq: Two times a day (BID) | ORAL | Status: DC
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
## 2015-03-29 ENCOUNTER — Ambulatory Visit (HOSPITAL_COMMUNITY): Payer: Self-pay | Admitting: Psychiatry

## 2015-04-20 ENCOUNTER — Telehealth (HOSPITAL_COMMUNITY): Payer: Self-pay | Admitting: *Deleted

## 2015-04-21 HISTORY — PX: PARS PLANA VITRECTOMY W/ SCLERAL BUCKLE: SHX2171

## 2015-04-24 ENCOUNTER — Telehealth (HOSPITAL_COMMUNITY): Payer: Self-pay | Admitting: *Deleted

## 2015-05-23 ENCOUNTER — Ambulatory Visit (HOSPITAL_COMMUNITY): Payer: Self-pay | Admitting: Psychiatry

## 2015-06-22 DIAGNOSIS — H15012 Anterior scleritis, left eye: Secondary | ICD-10-CM | POA: Insufficient documentation

## 2015-06-27 ENCOUNTER — Ambulatory Visit (HOSPITAL_COMMUNITY)
Admission: RE | Admit: 2015-06-27 | Discharge: 2015-06-27 | Disposition: A | Discharge status: Home / Self Care | Payer: BLUE CROSS/BLUE SHIELD | Source: Ambulatory Visit | Attending: Physician Assistant | Admitting: Physician Assistant

## 2015-06-27 ENCOUNTER — Other Ambulatory Visit (HOSPITAL_COMMUNITY): Payer: Self-pay | Admitting: Physician Assistant

## 2015-06-27 DIAGNOSIS — Z87891 Personal history of nicotine dependence: Secondary | ICD-10-CM | POA: Diagnosis not present

## 2015-06-27 DIAGNOSIS — I10 Essential (primary) hypertension: Secondary | ICD-10-CM | POA: Diagnosis not present

## 2015-06-27 DIAGNOSIS — H15092 Other scleritis, left eye: Secondary | ICD-10-CM

## 2015-06-27 DIAGNOSIS — J45909 Unspecified asthma, uncomplicated: Secondary | ICD-10-CM | POA: Diagnosis not present

## 2015-06-27 DIAGNOSIS — R062 Wheezing: Secondary | ICD-10-CM | POA: Insufficient documentation

## 2015-07-06 ENCOUNTER — Other Ambulatory Visit (HOSPITAL_COMMUNITY): Payer: Self-pay | Admitting: Psychiatry

## 2015-07-10 ENCOUNTER — Telehealth (HOSPITAL_COMMUNITY): Payer: Self-pay | Admitting: *Deleted

## 2015-07-10 NOTE — Telephone Encounter (Signed)
noted 

## 2015-07-10 NOTE — Telephone Encounter (Signed)
8:43 07-10-15, pt called stating that her PCP is filing her scripts that Dr. Harrington Challenger prescribes. Pt called stating she is no longer need Dr. Harrington Challenger assistance anymore

## 2015-07-12 ENCOUNTER — Telehealth (HOSPITAL_COMMUNITY): Payer: Self-pay | Admitting: *Deleted

## 2015-07-12 NOTE — Telephone Encounter (Signed)
Pt pharmacy [facility] in [place] sent office refills request for pt Cymbalta. Called pt pharmacy and spoke with Juanda Crumble the pharmacist and informed him that pt call office and informed this office that she will no longer be getting her Medications that Dr. Harrington Challenger fills from this office. Informed Juanda Crumble that pt would like for pt informed office that she will be giving it to her PCP to fill. Juanda Crumble showed understanding.

## 2015-08-02 ENCOUNTER — Ambulatory Visit (HOSPITAL_COMMUNITY): Payer: Self-pay | Admitting: Psychiatry

## 2015-08-25 ENCOUNTER — Other Ambulatory Visit (HOSPITAL_COMMUNITY): Payer: Self-pay | Admitting: Family Medicine

## 2015-08-25 ENCOUNTER — Ambulatory Visit (HOSPITAL_COMMUNITY)
Admission: RE | Admit: 2015-08-25 | Discharge: 2015-08-25 | Disposition: A | Discharge status: Home / Self Care | Payer: PPO | Source: Ambulatory Visit | Attending: Family Medicine | Admitting: Family Medicine

## 2015-08-25 DIAGNOSIS — M79641 Pain in right hand: Secondary | ICD-10-CM | POA: Insufficient documentation

## 2015-11-09 DIAGNOSIS — J320 Chronic maxillary sinusitis: Secondary | ICD-10-CM | POA: Diagnosis not present

## 2015-11-09 DIAGNOSIS — Z8669 Personal history of other diseases of the nervous system and sense organs: Secondary | ICD-10-CM | POA: Diagnosis not present

## 2015-11-09 DIAGNOSIS — H33021 Retinal detachment with multiple breaks, right eye: Secondary | ICD-10-CM | POA: Diagnosis not present

## 2015-11-09 DIAGNOSIS — H3521 Other non-diabetic proliferative retinopathy, right eye: Secondary | ICD-10-CM | POA: Diagnosis not present

## 2015-11-09 DIAGNOSIS — H44791 Retained (old) intraocular foreign body, nonmagnetic, in other or multiple sites, right eye: Secondary | ICD-10-CM | POA: Diagnosis not present

## 2015-12-13 ENCOUNTER — Other Ambulatory Visit: Payer: Self-pay | Admitting: Adult Health

## 2015-12-13 ENCOUNTER — Ambulatory Visit (INDEPENDENT_AMBULATORY_CARE_PROVIDER_SITE_OTHER): Payer: PPO | Admitting: Adult Health

## 2015-12-13 ENCOUNTER — Other Ambulatory Visit (HOSPITAL_COMMUNITY)
Admission: RE | Admit: 2015-12-13 | Discharge: 2015-12-13 | Disposition: A | Discharge status: Home / Self Care | Payer: PPO | Source: Ambulatory Visit | Attending: Adult Health | Admitting: Adult Health

## 2015-12-13 ENCOUNTER — Encounter: Payer: Self-pay | Admitting: Adult Health

## 2015-12-13 VITALS — BP 120/80 | HR 74 | Ht 61.5 in | Wt 190.5 lb

## 2015-12-13 DIAGNOSIS — Z1212 Encounter for screening for malignant neoplasm of rectum: Secondary | ICD-10-CM | POA: Diagnosis not present

## 2015-12-13 DIAGNOSIS — N644 Mastodynia: Secondary | ICD-10-CM | POA: Insufficient documentation

## 2015-12-13 DIAGNOSIS — N951 Menopausal and female climacteric states: Secondary | ICD-10-CM | POA: Diagnosis not present

## 2015-12-13 DIAGNOSIS — Z113 Encounter for screening for infections with a predominantly sexual mode of transmission: Secondary | ICD-10-CM | POA: Diagnosis not present

## 2015-12-13 DIAGNOSIS — Z1151 Encounter for screening for human papillomavirus (HPV): Secondary | ICD-10-CM | POA: Insufficient documentation

## 2015-12-13 DIAGNOSIS — Z1231 Encounter for screening mammogram for malignant neoplasm of breast: Secondary | ICD-10-CM

## 2015-12-13 DIAGNOSIS — Z124 Encounter for screening for malignant neoplasm of cervix: Secondary | ICD-10-CM | POA: Diagnosis not present

## 2015-12-13 DIAGNOSIS — R5383 Other fatigue: Secondary | ICD-10-CM | POA: Diagnosis not present

## 2015-12-13 DIAGNOSIS — Z01419 Encounter for gynecological examination (general) (routine) without abnormal findings: Secondary | ICD-10-CM

## 2015-12-13 DIAGNOSIS — R232 Flushing: Secondary | ICD-10-CM | POA: Insufficient documentation

## 2015-12-13 LAB — HEMOCCULT GUIAC POC 1CARD (OFFICE): Fecal Occult Blood, POC: NEGATIVE

## 2015-12-13 NOTE — Progress Notes (Addendum)
Patient ID: Sara Barnett, female   DOB: 06-10-1966, 50 y.o.   MRN: [id number] History of Present Illness: Nikko is a 50 year old white female, divorced, G2P2, in for a well woman gyn exam and pap.She has detached retinas with vision loss and is seen at [facility].She complains of fatigue and hot flashes and still has period after ablation, and is having bilateral breast tenderness, has not had mammogram in years.She is disabled due to vision loss. 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  PCP is [place].   Current Medications, Allergies, Past Medical History, Past Surgical History, Family History and Social History were reviewed in [employer] record.     Review of Systems: Patient denies any headaches, hearing loss, shortness of breath, chest pain, abdominal pain, problems with bowel movements, urination, or intercourse.(not had sex in 2 years) No joint pain or mood swings.See HPI for positives.    Physical Exam:BP 120/80 mmHg  Pulse 74  Ht 5' 1.5" (1.562 m)  Wt 190 lb 8 oz (86.41 kg)  BMI 35.42 kg/m2  LMP 12/03/2015 (Approximate) General:  Well developed, well nourished, no acute distress Skin:  Warm and dry Neck:  Midline trachea, normal thyroid, good ROM, no lymphadenopathy Lungs; Clear to auscultation bilaterally Breast:  No dominant palpable mass, retraction, or nipple discharge, has bilateral regular irregularities, and tenderness Cardiovascular: Regular rate and rhythm Abdomen:  Soft, non tender, no hepatosplenomegaly Pelvic:  External genitalia is normal in appearance, no lesions.  The vagina is normal in appearance. Urethra has no lesions or masses. The cervix is smooth, pap with HPV and GC/CHL performed.  Uterus is felt to be normal size, shape, and contour.  No adnexal masses, has bilateral tenderness noted  with exam, but she had none before palpation.Bladder is non tender, no masses felt. Rectal: Good sphincter tone, no polyps, or hemorrhoids felt.  Hemoccult negative. Extremities/musculoskeletal:  No swelling or varicosities noted, no clubbing or cyanosis Psych:  No mood changes, alert and cooperative,seems happy Discussed some complaints could be menopause related, will check labs, she says has had TSH checked and was normal. She wants Cymbalta decreased, told to discuss with PCP.  Impression:  Well woman gyn exam and pap  Breast tenderness Fatigue  Hot flashes   Plan: Check CBC,CMP,FSH, and B12 Get mammogram 2/20 at 4:30 at [facility] Physical in 1 year, pap in 3 if normal Follow up with PCP about meds  Review handout on menopause

## 2015-12-13 NOTE — Patient Instructions (Addendum)
Physical in 1 year, pap in 3 years if normal Mammogram now and yearly  2/20 at 4:30 pm at [facility]  Menopause Menopause is the normal time of life when menstrual periods stop completely. Menopause is complete when you have missed 12 consecutive menstrual periods. It usually occurs between the ages of 74 years and 77 years. Very rarely does a woman develop menopause before the age of 1 years. At menopause, your ovaries stop producing the female hormones estrogen and progesterone. This can cause undesirable symptoms and also affect your health. Sometimes the symptoms may occur 4-5 years before the menopause begins. There is no relationship between menopause and:  Oral contraceptives.  Number of children you had.  Race.  The age your menstrual periods started (menarche). Heavy smokers and very thin women may develop menopause earlier in life. CAUSES  The ovaries stop producing the female hormones estrogen and progesterone.  Other causes include:  Surgery to remove both ovaries.  The ovaries stop functioning for no known reason.  Tumors of the pituitary gland in the brain.  Medical disease that affects the ovaries and hormone production.  Radiation treatment to the abdomen or pelvis.  Chemotherapy that affects the ovaries. SYMPTOMS   Hot flashes.  Night sweats.  Decrease in sex drive.  Vaginal dryness and thinning of the vagina causing painful intercourse.  Dryness of the skin and developing wrinkles.  Headaches.  Tiredness.  Irritability.  Memory problems.  Weight gain.  Bladder infections.  Hair growth of the face and chest.  Infertility. More serious symptoms include:  Loss of bone (osteoporosis) causing breaks (fractures).  Depression.  Hardening and narrowing of the arteries (atherosclerosis) causing heart attacks and strokes. DIAGNOSIS   When the menstrual periods have stopped for 12 straight months.  Physical exam.  Hormone studies of the  blood. TREATMENT  There are many treatment choices and nearly as many questions about them. The decisions to treat or not to treat menopausal changes is an individual choice made with your health care provider. Your health care provider can discuss the treatments with you. Together, you can decide which treatment will work best for you. Your treatment choices may include:   Hormone therapy (estrogen and progesterone).  Non-hormonal medicines.  Treating the individual symptoms with medicine (for example antidepressants for depression).  Herbal medicines that may help specific symptoms.  Counseling by a psychiatrist or psychologist.  Group therapy.  Lifestyle changes including:  Eating healthy.  Regular exercise.  Limiting caffeine and alcohol.  Stress management and meditation.  No treatment. HOME CARE INSTRUCTIONS   Take the medicine your health care provider gives you as directed.  Get plenty of sleep and rest.  Exercise regularly.  Eat a diet that contains calcium (good for the bones) and soy products (acts like estrogen hormone).  Avoid alcoholic beverages.  Do not smoke.  If you have hot flashes, dress in layers.  Take supplements, calcium, and vitamin D to strengthen bones.  You can use over-the-counter lubricants or moisturizers for vaginal dryness.  Group therapy is sometimes very helpful.  Acupuncture may be helpful in some cases. SEEK MEDICAL CARE IF:   You are not sure you are in menopause.  You are having menopausal symptoms and need advice and treatment.  You are still having menstrual periods after age 40 years.  You have pain with intercourse.  Menopause is complete (no menstrual period for 12 months) and you develop vaginal bleeding.  You need a referral to a specialist (gynecologist, psychiatrist,  or psychologist) for treatment. SEEK IMMEDIATE MEDICAL CARE IF:   You have severe depression.  You have excessive vaginal bleeding.  You  fell and think you have a broken bone.  You have pain when you urinate.  You develop leg or chest pain.  You have a fast pounding heart beat (palpitations).  You have severe headaches.  You develop vision problems.  You feel a lump in your breast.  You have abdominal pain or severe indigestion.   This information is not intended to replace advice given to you by your health care provider. Make sure you discuss any questions you have with your health care provider.   Document Released: 01/04/2004 Document Revised: 06/16/2013 Document Reviewed: 05/13/2013 Elsevier Interactive Patient Education [employer].

## 2015-12-14 ENCOUNTER — Telehealth: Payer: Self-pay | Admitting: Adult Health

## 2015-12-14 LAB — CBC
HEMATOCRIT: 45.6 % (ref 34.0–46.6)
Hemoglobin: 15.4 g/dL (ref 11.1–15.9)
MCH: 30 pg (ref 26.6–33.0)
MCHC: 33.8 g/dL (ref 31.5–35.7)
MCV: 89 fL (ref 79–97)
Platelets: 325 10*3/uL (ref 150–379)
RBC: 5.14 x10E6/uL (ref 3.77–5.28)
RDW: 13.7 % (ref 12.3–15.4)
WBC: 5.1 10*3/uL (ref 3.4–10.8)

## 2015-12-14 LAB — COMPREHENSIVE METABOLIC PANEL
ALBUMIN: 4.3 g/dL (ref 3.5–5.5)
ALT: 26 IU/L (ref 0–32)
AST: 21 IU/L (ref 0–40)
Albumin/Globulin Ratio: 1.5 (ref 1.1–2.5)
Alkaline Phosphatase: 77 IU/L (ref 39–117)
BUN/Creatinine Ratio: 14 (ref 9–23)
BUN: 10 mg/dL (ref 6–24)
Bilirubin Total: 0.5 mg/dL (ref 0.0–1.2)
CO2: 24 mmol/L (ref 18–29)
CREATININE: 0.72 mg/dL (ref 0.57–1.00)
Calcium: 9.7 mg/dL (ref 8.7–10.2)
Chloride: 98 mmol/L (ref 96–106)
GFR, EST AFRICAN AMERICAN: 115 mL/min/{1.73_m2} (ref >59)
GFR, EST NON AFRICAN AMERICAN: 100 mL/min/{1.73_m2} (ref >59)
GLOBULIN, TOTAL: 2.8 g/dL (ref 1.5–4.5)
Glucose: 91 mg/dL (ref 65–99)
Potassium: 4.5 mmol/L (ref 3.5–5.2)
SODIUM: 138 mmol/L (ref 134–144)
Total Protein: 7.1 g/dL (ref 6.0–8.5)

## 2015-12-14 LAB — FOLLICLE STIMULATING HORMONE: FSH: 6.2 m[IU]/mL

## 2015-12-14 LAB — VITAMIN B12: VITAMIN B 12: 806 pg/mL (ref 211–946)

## 2015-12-14 NOTE — Telephone Encounter (Signed)
Pt aware of labs normal. 

## 2015-12-15 LAB — CYTOLOGY - PAP

## 2015-12-18 ENCOUNTER — Ambulatory Visit (HOSPITAL_COMMUNITY): Payer: Self-pay

## 2015-12-25 ENCOUNTER — Ambulatory Visit (HOSPITAL_COMMUNITY)
Admission: RE | Admit: 2015-12-25 | Discharge: 2015-12-25 | Disposition: A | Discharge status: Home / Self Care | Payer: PPO | Source: Ambulatory Visit | Attending: Adult Health | Admitting: Adult Health

## 2015-12-25 DIAGNOSIS — R928 Other abnormal and inconclusive findings on diagnostic imaging of breast: Secondary | ICD-10-CM | POA: Diagnosis not present

## 2015-12-25 DIAGNOSIS — Z1231 Encounter for screening mammogram for malignant neoplasm of breast: Secondary | ICD-10-CM

## 2015-12-29 DIAGNOSIS — F329 Major depressive disorder, single episode, unspecified: Secondary | ICD-10-CM | POA: Diagnosis not present

## 2015-12-29 DIAGNOSIS — I10 Essential (primary) hypertension: Secondary | ICD-10-CM | POA: Diagnosis not present

## 2015-12-29 DIAGNOSIS — Z6833 Body mass index (BMI) 33.0-33.9, adult: Secondary | ICD-10-CM | POA: Diagnosis not present

## 2015-12-29 DIAGNOSIS — Z1389 Encounter for screening for other disorder: Secondary | ICD-10-CM | POA: Diagnosis not present

## 2015-12-29 DIAGNOSIS — Z0001 Encounter for general adult medical examination with abnormal findings: Secondary | ICD-10-CM | POA: Diagnosis not present

## 2016-01-02 ENCOUNTER — Other Ambulatory Visit: Payer: Self-pay | Admitting: Adult Health

## 2016-01-02 DIAGNOSIS — R928 Other abnormal and inconclusive findings on diagnostic imaging of breast: Secondary | ICD-10-CM

## 2016-01-18 ENCOUNTER — Other Ambulatory Visit: Payer: Self-pay | Admitting: Adult Health

## 2016-01-18 DIAGNOSIS — R928 Other abnormal and inconclusive findings on diagnostic imaging of breast: Secondary | ICD-10-CM

## 2016-01-22 DIAGNOSIS — R0602 Shortness of breath: Secondary | ICD-10-CM | POA: Diagnosis not present

## 2016-01-22 DIAGNOSIS — Z6834 Body mass index (BMI) 34.0-34.9, adult: Secondary | ICD-10-CM | POA: Diagnosis not present

## 2016-01-22 DIAGNOSIS — J449 Chronic obstructive pulmonary disease, unspecified: Secondary | ICD-10-CM | POA: Diagnosis not present

## 2016-01-23 ENCOUNTER — Other Ambulatory Visit: Payer: Self-pay | Admitting: Adult Health

## 2016-01-23 ENCOUNTER — Ambulatory Visit (HOSPITAL_COMMUNITY)
Admission: RE | Admit: 2016-01-23 | Discharge: 2016-01-23 | Disposition: A | Discharge status: Home / Self Care | Payer: PPO | Source: Ambulatory Visit | Attending: Adult Health | Admitting: Adult Health

## 2016-01-23 DIAGNOSIS — N63 Unspecified lump in breast: Secondary | ICD-10-CM | POA: Insufficient documentation

## 2016-01-23 DIAGNOSIS — R928 Other abnormal and inconclusive findings on diagnostic imaging of breast: Secondary | ICD-10-CM | POA: Diagnosis not present

## 2016-01-30 ENCOUNTER — Ambulatory Visit (HOSPITAL_COMMUNITY)
Admission: RE | Admit: 2016-01-30 | Discharge: 2016-01-30 | Disposition: A | Discharge status: Home / Self Care | Payer: PPO | Source: Ambulatory Visit | Attending: Adult Health | Admitting: Adult Health

## 2016-01-30 ENCOUNTER — Ambulatory Visit (HOSPITAL_COMMUNITY): Payer: PPO

## 2016-01-30 ENCOUNTER — Inpatient Hospital Stay (HOSPITAL_COMMUNITY)
Admission: RE | Admit: 2016-01-30 | Discharge: 2016-01-30 | Disposition: A | Discharge status: Home / Self Care | Payer: Self-pay | Source: Ambulatory Visit | Attending: Adult Health | Admitting: Adult Health

## 2016-01-30 ENCOUNTER — Other Ambulatory Visit: Payer: Self-pay | Admitting: Adult Health

## 2016-01-30 DIAGNOSIS — D241 Benign neoplasm of right breast: Secondary | ICD-10-CM | POA: Insufficient documentation

## 2016-01-30 DIAGNOSIS — N6310 Unspecified lump in the right breast, unspecified quadrant: Secondary | ICD-10-CM

## 2016-01-30 DIAGNOSIS — N63 Unspecified lump in breast: Secondary | ICD-10-CM | POA: Diagnosis not present

## 2016-01-30 DIAGNOSIS — R928 Other abnormal and inconclusive findings on diagnostic imaging of breast: Secondary | ICD-10-CM

## 2016-01-30 MED ORDER — LIDOCAINE-EPINEPHRINE (PF) 1 %-1:200000 IJ SOLN
INTRAMUSCULAR | Status: AC
  Filled 2016-01-30: qty 10

## 2016-01-30 MED ORDER — LIDOCAINE HCL (PF) 1 % IJ SOLN
INTRAMUSCULAR | Status: AC
  Filled 2016-01-30: qty 10

## 2016-01-30 NOTE — Discharge Instructions (Signed)
Breast Biopsy, Care After °These instructions give you information on caring for yourself after your procedure. Your doctor may also give you more specific instructions. Call your doctor if you have any problems or questions after your procedure. °HOME CARE °· Only take medicine as told by your doctor. °· Do not take aspirin. °· Keep your sutures (stitches) dry when bathing. °· Protect the biopsy area. Do not let the area get bumped. °· Avoid activities that could pull the biopsy site open until your doctor approves. This includes: °¨ Stretching. °¨ Reaching. °¨ Exercise. °¨ Sports. °¨ Lifting more than 3lb. °· Continue your normal diet. °· Wear a good support bra for as long as told by your doctor. °· Change any bandages (dressings) as told by your doctor. °· Do not drink alcohol while taking pain medicine. °· Keep all doctor visits as told. Ask when your test results will be ready. Make sure you get your test results. °GET HELP RIGHT AWAY IF:  °· You have a fever. °· You have more bleeding (more than a small spot) from the biopsy site. °· You have trouble breathing. °· You have yellowish-white fluid (pus) coming from the biopsy site. °· You have redness, puffiness (swelling), or more pain in the biopsy site. °· You have a bad smell coming from the biopsy site. °· Your biopsy site opens after sutures, staples, or sticky strips have been removed. °· You have a rash. °· You need stronger medicine. °MAKE SURE YOU: °· Understand these instructions. °· Will watch your condition. °· Will get help right away if you are not doing well or get worse. °  °This information is not intended to replace advice given to you by your health care provider. Make sure you discuss any questions you have with your health care provider. °  °Document Released: 08/10/2009 Document Revised: 11/04/2014 Document Reviewed: 11/24/2011 °Elsevier Interactive Patient Education ©2016 Elsevier Inc. ° °

## 2016-02-08 DIAGNOSIS — Z8669 Personal history of other diseases of the nervous system and sense organs: Secondary | ICD-10-CM | POA: Diagnosis not present

## 2016-02-08 DIAGNOSIS — H33021 Retinal detachment with multiple breaks, right eye: Secondary | ICD-10-CM | POA: Diagnosis not present

## 2016-04-26 DIAGNOSIS — F329 Major depressive disorder, single episode, unspecified: Secondary | ICD-10-CM | POA: Diagnosis not present

## 2016-04-26 DIAGNOSIS — Z1389 Encounter for screening for other disorder: Secondary | ICD-10-CM | POA: Diagnosis not present

## 2016-04-26 DIAGNOSIS — J449 Chronic obstructive pulmonary disease, unspecified: Secondary | ICD-10-CM | POA: Diagnosis not present

## 2016-04-26 DIAGNOSIS — Z6834 Body mass index (BMI) 34.0-34.9, adult: Secondary | ICD-10-CM | POA: Diagnosis not present

## 2016-04-26 DIAGNOSIS — J4520 Mild intermittent asthma, uncomplicated: Secondary | ICD-10-CM | POA: Diagnosis not present

## 2016-05-09 DIAGNOSIS — Z8669 Personal history of other diseases of the nervous system and sense organs: Secondary | ICD-10-CM | POA: Diagnosis not present

## 2016-05-09 DIAGNOSIS — H3521 Other non-diabetic proliferative retinopathy, right eye: Secondary | ICD-10-CM | POA: Diagnosis not present

## 2016-05-09 DIAGNOSIS — H33021 Retinal detachment with multiple breaks, right eye: Secondary | ICD-10-CM | POA: Diagnosis not present

## 2016-05-17 DIAGNOSIS — E669 Obesity, unspecified: Secondary | ICD-10-CM | POA: Diagnosis not present

## 2016-05-17 DIAGNOSIS — Z8669 Personal history of other diseases of the nervous system and sense organs: Secondary | ICD-10-CM | POA: Diagnosis not present

## 2016-05-17 DIAGNOSIS — Z87891 Personal history of nicotine dependence: Secondary | ICD-10-CM | POA: Diagnosis not present

## 2016-05-17 DIAGNOSIS — Z79899 Other long term (current) drug therapy: Secondary | ICD-10-CM | POA: Diagnosis not present

## 2016-05-17 DIAGNOSIS — H2141 Pupillary membranes, right eye: Secondary | ICD-10-CM | POA: Diagnosis not present

## 2016-05-17 DIAGNOSIS — Z5181 Encounter for therapeutic drug level monitoring: Secondary | ICD-10-CM | POA: Diagnosis not present

## 2016-05-17 DIAGNOSIS — H548 Legal blindness, as defined in USA: Secondary | ICD-10-CM | POA: Diagnosis not present

## 2016-05-17 DIAGNOSIS — T85398A Other mechanical complication of other ocular prosthetic devices, implants and grafts, initial encounter: Secondary | ICD-10-CM | POA: Diagnosis not present

## 2016-05-17 DIAGNOSIS — I10 Essential (primary) hypertension: Secondary | ICD-10-CM | POA: Diagnosis not present

## 2016-05-17 DIAGNOSIS — F329 Major depressive disorder, single episode, unspecified: Secondary | ICD-10-CM | POA: Diagnosis not present

## 2016-05-17 DIAGNOSIS — H3321 Serous retinal detachment, right eye: Secondary | ICD-10-CM | POA: Diagnosis not present

## 2016-08-06 DIAGNOSIS — K219 Gastro-esophageal reflux disease without esophagitis: Secondary | ICD-10-CM | POA: Diagnosis not present

## 2016-08-06 DIAGNOSIS — Z6833 Body mass index (BMI) 33.0-33.9, adult: Secondary | ICD-10-CM | POA: Diagnosis not present

## 2016-08-06 DIAGNOSIS — I10 Essential (primary) hypertension: Secondary | ICD-10-CM | POA: Diagnosis not present

## 2016-08-06 DIAGNOSIS — F331 Major depressive disorder, recurrent, moderate: Secondary | ICD-10-CM | POA: Diagnosis not present

## 2016-08-06 DIAGNOSIS — F419 Anxiety disorder, unspecified: Secondary | ICD-10-CM | POA: Diagnosis not present

## 2016-08-06 DIAGNOSIS — R635 Abnormal weight gain: Secondary | ICD-10-CM | POA: Diagnosis not present

## 2016-08-15 DIAGNOSIS — Z8669 Personal history of other diseases of the nervous system and sense organs: Secondary | ICD-10-CM | POA: Diagnosis not present

## 2016-08-26 DIAGNOSIS — Z0000 Encounter for general adult medical examination without abnormal findings: Secondary | ICD-10-CM | POA: Diagnosis not present

## 2016-08-26 DIAGNOSIS — I10 Essential (primary) hypertension: Secondary | ICD-10-CM | POA: Diagnosis not present

## 2016-08-28 DIAGNOSIS — K219 Gastro-esophageal reflux disease without esophagitis: Secondary | ICD-10-CM | POA: Diagnosis not present

## 2016-08-28 DIAGNOSIS — R635 Abnormal weight gain: Secondary | ICD-10-CM | POA: Diagnosis not present

## 2016-08-28 DIAGNOSIS — R7301 Impaired fasting glucose: Secondary | ICD-10-CM | POA: Diagnosis not present

## 2016-08-28 DIAGNOSIS — E782 Mixed hyperlipidemia: Secondary | ICD-10-CM | POA: Diagnosis not present

## 2016-08-28 DIAGNOSIS — F419 Anxiety disorder, unspecified: Secondary | ICD-10-CM | POA: Diagnosis not present

## 2016-08-28 DIAGNOSIS — Z0001 Encounter for general adult medical examination with abnormal findings: Secondary | ICD-10-CM | POA: Diagnosis not present

## 2016-08-28 DIAGNOSIS — I10 Essential (primary) hypertension: Secondary | ICD-10-CM | POA: Diagnosis not present

## 2016-08-28 DIAGNOSIS — F331 Major depressive disorder, recurrent, moderate: Secondary | ICD-10-CM | POA: Diagnosis not present

## 2016-09-13 DIAGNOSIS — L281 Prurigo nodularis: Secondary | ICD-10-CM | POA: Diagnosis not present

## 2016-09-13 DIAGNOSIS — L2089 Other atopic dermatitis: Secondary | ICD-10-CM | POA: Diagnosis not present

## 2016-09-13 DIAGNOSIS — Z419 Encounter for procedure for purposes other than remedying health state, unspecified: Secondary | ICD-10-CM | POA: Diagnosis not present

## 2016-09-13 DIAGNOSIS — D1801 Hemangioma of skin and subcutaneous tissue: Secondary | ICD-10-CM | POA: Diagnosis not present

## 2016-09-13 DIAGNOSIS — L814 Other melanin hyperpigmentation: Secondary | ICD-10-CM | POA: Diagnosis not present

## 2016-10-02 DIAGNOSIS — Z6832 Body mass index (BMI) 32.0-32.9, adult: Secondary | ICD-10-CM | POA: Diagnosis not present

## 2016-10-02 DIAGNOSIS — R635 Abnormal weight gain: Secondary | ICD-10-CM | POA: Diagnosis not present

## 2016-11-07 DIAGNOSIS — Z8669 Personal history of other diseases of the nervous system and sense organs: Secondary | ICD-10-CM | POA: Diagnosis not present

## 2016-11-07 DIAGNOSIS — H33021 Retinal detachment with multiple breaks, right eye: Secondary | ICD-10-CM | POA: Diagnosis not present

## 2016-11-07 DIAGNOSIS — H44431 Hypotony of eye due to other ocular disorders, right eye: Secondary | ICD-10-CM | POA: Diagnosis not present

## 2016-12-09 DIAGNOSIS — M170 Bilateral primary osteoarthritis of knee: Secondary | ICD-10-CM | POA: Diagnosis not present

## 2016-12-25 DIAGNOSIS — M791 Myalgia: Secondary | ICD-10-CM | POA: Diagnosis not present

## 2016-12-25 DIAGNOSIS — M545 Low back pain: Secondary | ICD-10-CM | POA: Diagnosis not present

## 2016-12-25 DIAGNOSIS — M7062 Trochanteric bursitis, left hip: Secondary | ICD-10-CM | POA: Diagnosis not present

## 2016-12-25 DIAGNOSIS — M47816 Spondylosis without myelopathy or radiculopathy, lumbar region: Secondary | ICD-10-CM | POA: Diagnosis not present

## 2016-12-30 DIAGNOSIS — M1712 Unilateral primary osteoarthritis, left knee: Secondary | ICD-10-CM | POA: Diagnosis not present

## 2017-01-01 DIAGNOSIS — Z6831 Body mass index (BMI) 31.0-31.9, adult: Secondary | ICD-10-CM | POA: Diagnosis not present

## 2017-01-01 DIAGNOSIS — K5900 Constipation, unspecified: Secondary | ICD-10-CM | POA: Diagnosis not present

## 2017-01-01 DIAGNOSIS — Z713 Dietary counseling and surveillance: Secondary | ICD-10-CM | POA: Diagnosis not present

## 2017-01-01 DIAGNOSIS — R635 Abnormal weight gain: Secondary | ICD-10-CM | POA: Diagnosis not present

## 2017-01-06 ENCOUNTER — Encounter: Payer: Self-pay | Admitting: Internal Medicine

## 2017-01-07 DIAGNOSIS — M25562 Pain in left knee: Secondary | ICD-10-CM | POA: Diagnosis not present

## 2017-01-07 DIAGNOSIS — M1712 Unilateral primary osteoarthritis, left knee: Secondary | ICD-10-CM | POA: Diagnosis not present

## 2017-01-07 DIAGNOSIS — G8929 Other chronic pain: Secondary | ICD-10-CM | POA: Diagnosis not present

## 2017-01-14 DIAGNOSIS — M1712 Unilateral primary osteoarthritis, left knee: Secondary | ICD-10-CM | POA: Diagnosis not present

## 2017-01-29 ENCOUNTER — Ambulatory Visit (INDEPENDENT_AMBULATORY_CARE_PROVIDER_SITE_OTHER): Payer: PPO | Admitting: Gastroenterology

## 2017-01-29 ENCOUNTER — Encounter: Payer: Self-pay | Admitting: Gastroenterology

## 2017-01-29 DIAGNOSIS — K5900 Constipation, unspecified: Secondary | ICD-10-CM | POA: Diagnosis not present

## 2017-01-29 NOTE — Progress Notes (Signed)
cc'ed to pcp °

## 2017-01-29 NOTE — Progress Notes (Signed)
Primary Care Physician:  Wende Neighbors, MD Primary Gastroenterologist:  Dr. Gala Romney   Chief Complaint  Patient presents with  . Constipation  . Hemorrhoids    HPI:   Sara Barnett is a 51 y.o. female presenting today at the request of her Pablo Lawrence, NP, secondary to constipation. Attached labs from October 2017 reviewed, with normal LFTs Hgb 15.3. I do not see a TSH attached.   Has been dealing with constipation her entire adult life. States multiple family members suffer from constipation. No prior colonoscopy. No rectal bleeding. Purposeful weight loss with phentermine. Prior regimens include: Miralax, fiber. Constipation is to the point that she will try a cleanse and have no output. Magnesium citrate did nothing for her. Will have to use enemas at times. Feels like she doesn't have stimulation to go sometimes. Feels bloated, gassy. Will look like she is pregnant. Feels like her rectum gets swollen at times with hemorrhoids but no bleeding. Declining rectal exam. No pain today.   Past Medical History:  Diagnosis Date  . Asthma   . Breast tenderness 12/13/2015  . Depression   . Detached retina    right and left eye at different times.   . Fatigue 12/13/2015  . GERD (gastroesophageal reflux disease)   . Hot flashes 12/13/2015  . Hypertension   . Vision loss, bilateral     Past Surgical History:  Procedure Laterality Date  . BACK SURGERY    . cataract surgery     at [age] year old, present at birth  . CESAREAN SECTION    . EYE SURGERY    . FOOT SURGERY    . INTRAOCULAR LENS INSERTION Bilateral   . multiple right eye surgeries    . PLANTAR FASCIA RELEASE    . SPINAL FUSION    . TUBAL LIGATION    . uterine ablation      Current Outpatient Prescriptions  Medication Sig Dispense Refill  . amLODipine-benazepril (LOTREL) 10-40 MG per capsule Take 1 capsule by mouth daily.    . Cholecalciferol (VITAMIN D PO) Take 1 tablet by mouth daily.    . DULoxetine (CYMBALTA) 60 MG  capsule Take 1 capsule (60 mg total) by mouth 2 (two) times daily. (Patient taking differently: Take 60 mg by mouth daily. ) 60 capsule 2  . fluocinonide cream (LIDEX) 1.10 % Apply 1 application topically daily.    Marland Kitchen FLUTICASONE PROPIONATE, NASAL, NA Place 2 sprays into the nose as needed.     . lansoprazole (PREVACID) 30 MG capsule Take 30 mg by mouth daily at 12 noon.    . methocarbamol (ROBAXIN) 500 MG tablet Take 500 mg by mouth 3 (three) times daily as needed.    . Multiple Vitamin (MULTIVITAMIN) tablet Take 1 tablet by mouth daily.    Marland Kitchen OVER THE COUNTER MEDICATION Vitamin A by mouth daily    . OVER THE COUNTER MEDICATION Lutein blue daily    . OVER THE COUNTER MEDICATION Vitamin for hair daily    . phentermine 37.5 MG capsule Take 37.5 mg by mouth daily.    . prednisoLONE acetate (PRED FORTE) 1 % ophthalmic suspension Place into the right eye daily. 3-6 drops in right eye daily    . Tiotropium Bromide Monohydrate (SPIRIVA HANDIHALER IN) Inhale into the lungs as needed.      No current facility-administered medications for this visit.     Allergies as of 01/29/2017 - Review Complete 01/29/2017  Allergen Reaction Noted  . Doxycycline Shortness  Of Breath 12/18/2011  . Methylprednisolone Anaphylaxis 12/13/2015  . Statins Swelling and Other (See Comments) 12/18/2011  . Hydrocodone-acetaminophen Itching 12/13/2015  . Other  12/18/2011    Family History  Problem Relation Age of Onset  . Depression Mother   . Cancer Father     prostate  . Hypertension Father   . Hearing loss Father   . Vision loss Daughter     cataracts  . Stroke Maternal Grandmother   . Other Paternal Grandmother     lung issues  . Vision loss Daughter     cataracts  . Colon cancer Neg Hx   . Colon polyps Neg Hx     Social History   Social History  . Marital status: Divorced    Spouse name: N/A  . Number of children: N/A  . Years of education: N/A   Occupational History  . Not on file.   Social  History Main Topics  . Smoking status: Former Smoker    Types: Cigarettes    Quit date: 05/24/2009  . Smokeless tobacco: Never Used  . Alcohol use Yes     Comment: glass of wine occ  . Drug use: No  . Sexual activity: Not Currently    Birth control/ protection: None, Surgical     Comment: tubal and ablation   Other Topics Concern  . Not on file   Social History Narrative  . No narrative on file    Review of Systems: Gen: see HPI  CV: Denies chest pain, heart palpitations, peripheral edema, syncope.  Resp: Denies shortness of breath at rest or with exertion. Denies wheezing or cough.  GI: see HPI  GU : Denies urinary burning, urinary frequency, urinary hesitancy MS: hip pain  Derm: Denies rash, itching, dry skin Psych: +depression Heme: Denies bruising, bleeding, and enlarged lymph nodes.  Physical Exam: BP 140/89   Pulse 92   Temp 97.5 F (36.4 C) (Oral)   Ht 5\' 3"  (1.6 m)   Wt 181 lb 3.2 oz (82.2 kg)   LMP 01/19/2017 (Approximate) Comment: irregular periods  BMI 32.10 kg/m  General:   Alert and oriented. Pleasant and cooperative. Well-nourished and well-developed.  Head:  Normocephalic and atraumatic. Eyes:  Without icterus, sclera clear and conjunctiva pink.  Ears:  Normal auditory acuity. Nose:  No deformity, discharge,  or lesions. Lungs:  Clear to auscultation bilaterally. No wheezes, rales, or rhonchi. No distress.  Heart:  S1, S2 present without murmurs appreciated.  Abdomen:  +BS, soft, non-tender and non-distended. No HSM noted. No guarding or rebound. No masses appreciated.  Rectal:  Declined Msk:  Symmetrical without gross deformities. Normal posture. Extremities:  Without edema. Neurologic:  Alert and  oriented x4;  grossly normal neurologically. Psych:  Alert and cooperative. Normal mood and affect.

## 2017-01-29 NOTE — Patient Instructions (Signed)
Start taking Linzess one capsule each morning, 30 minutes before breakfast. Let me know how this does in about a week!  I will see you in 6-8 weeks.

## 2017-01-29 NOTE — Assessment & Plan Note (Signed)
51 year old female with chronic constipation and no alarm features. No prior colonoscopy. Failed OTC laxative therapy, no prior prescriptive therapy. Will trial Linzess 290 mcg once daily. Samples provided. Obtain outside TSH level. Return in 6-8 weeks.

## 2017-02-06 DIAGNOSIS — Z8669 Personal history of other diseases of the nervous system and sense organs: Secondary | ICD-10-CM | POA: Diagnosis not present

## 2017-02-06 DIAGNOSIS — H33021 Retinal detachment with multiple breaks, right eye: Secondary | ICD-10-CM | POA: Diagnosis not present

## 2017-02-06 DIAGNOSIS — H3521 Other non-diabetic proliferative retinopathy, right eye: Secondary | ICD-10-CM | POA: Diagnosis not present

## 2017-02-06 DIAGNOSIS — H02051 Trichiasis without entropian right upper eyelid: Secondary | ICD-10-CM | POA: Diagnosis not present

## 2017-02-06 DIAGNOSIS — H44431 Hypotony of eye due to other ocular disorders, right eye: Secondary | ICD-10-CM | POA: Diagnosis not present

## 2017-02-10 ENCOUNTER — Telehealth: Payer: Self-pay | Admitting: Internal Medicine

## 2017-02-10 MED ORDER — LINACLOTIDE 290 MCG PO CAPS: 290.0000 ug | ORAL_CAPSULE | Freq: Every day | ORAL | 3 refills | Status: DC

## 2017-02-10 NOTE — Addendum Note (Signed)
Addended by: Annitta Needs on: 02/10/2017 04:56 PM   Modules accepted: Orders

## 2017-02-10 NOTE — Telephone Encounter (Signed)
Done

## 2017-02-10 NOTE — Telephone Encounter (Signed)
Routing to AB for rx.

## 2017-02-10 NOTE — Telephone Encounter (Signed)
Pt has tried her linzess samples and ran out yesterday. She needs a prescription called into [facility].

## 2017-03-06 ENCOUNTER — Telehealth: Payer: Self-pay

## 2017-03-06 NOTE — Telephone Encounter (Signed)
Pt called office. She has been taking Linzess 263mcg. She didn't take it today d/t it is making her feel bloated and gassy. Feels like she is going to have diarrhea at all times. When she goes to bathroom she has gas and small loose stool. She isn't having any solid stool. Stated the 212mcg is the only strength of Linzess she has taken. She saw a commercial on tv and felt like she should call the office. Routing to AB.

## 2017-03-07 NOTE — Telephone Encounter (Signed)
Let's give samples of Amitiza 24 mcg. Take this twice a day with food to avoid nausea.

## 2017-03-10 NOTE — Telephone Encounter (Signed)
LMOM to call.

## 2017-03-10 NOTE — Telephone Encounter (Signed)
#  12 samples at front for pick up.

## 2017-03-11 NOTE — Telephone Encounter (Signed)
PT is aware.

## 2017-03-14 DIAGNOSIS — R7301 Impaired fasting glucose: Secondary | ICD-10-CM | POA: Diagnosis not present

## 2017-03-14 DIAGNOSIS — R5383 Other fatigue: Secondary | ICD-10-CM | POA: Diagnosis not present

## 2017-03-14 DIAGNOSIS — E782 Mixed hyperlipidemia: Secondary | ICD-10-CM | POA: Diagnosis not present

## 2017-03-19 ENCOUNTER — Encounter: Payer: Self-pay | Admitting: Gastroenterology

## 2017-03-19 ENCOUNTER — Ambulatory Visit (INDEPENDENT_AMBULATORY_CARE_PROVIDER_SITE_OTHER): Payer: PPO | Admitting: Gastroenterology

## 2017-03-19 VITALS — BP 128/86 | HR 90 | Temp 96.5°F | Ht 63.0 in | Wt 177.6 lb

## 2017-03-19 DIAGNOSIS — K5900 Constipation, unspecified: Secondary | ICD-10-CM

## 2017-03-19 DIAGNOSIS — Z6831 Body mass index (BMI) 31.0-31.9, adult: Secondary | ICD-10-CM | POA: Diagnosis not present

## 2017-03-19 DIAGNOSIS — R7301 Impaired fasting glucose: Secondary | ICD-10-CM | POA: Diagnosis not present

## 2017-03-19 DIAGNOSIS — E6609 Other obesity due to excess calories: Secondary | ICD-10-CM | POA: Diagnosis not present

## 2017-03-19 DIAGNOSIS — K219 Gastro-esophageal reflux disease without esophagitis: Secondary | ICD-10-CM | POA: Diagnosis not present

## 2017-03-19 DIAGNOSIS — I10 Essential (primary) hypertension: Secondary | ICD-10-CM | POA: Diagnosis not present

## 2017-03-19 DIAGNOSIS — F5111 Primary hypersomnia: Secondary | ICD-10-CM | POA: Diagnosis not present

## 2017-03-19 DIAGNOSIS — F411 Generalized anxiety disorder: Secondary | ICD-10-CM | POA: Diagnosis not present

## 2017-03-19 DIAGNOSIS — E782 Mixed hyperlipidemia: Secondary | ICD-10-CM | POA: Diagnosis not present

## 2017-03-19 NOTE — Progress Notes (Signed)
Referring Provider: Celene Squibb, MD Primary Care Physician:  Celene Squibb, MD Primary GI: Dr. Gala Romney   Chief Complaint  Patient presents with  . Constipation    Linzess not helping  . Abdominal Pain    mid lower abd    HPI:   Sara Barnett is a 51 y.o. female presenting today with a history of constipation. Prescribed Linzess at last visit (290 mcg). However, this caused diarrhea and bloating. She was then recommended to trial Amitiza 24 mcg; however, this was not picked up as she wanted to wait until she was seen. Here for follow-up.   No rectal bleeding. Abdominal discomfort with constipation. Linzess worked at first, and she was excited, then it went to every other day. Then, she would go multiple days without having it. Has had to use suppository. Rectal bleeding. No TSH on file.   Past Medical History:  Diagnosis Date  . Asthma   . Breast tenderness 12/13/2015  . Depression   . Detached retina    right and left eye at different times.   . Fatigue 12/13/2015  . GERD (gastroesophageal reflux disease)   . Hot flashes 12/13/2015  . Hypertension   . Vision loss, bilateral     Past Surgical History:  Procedure Laterality Date  . BACK SURGERY    . cataract surgery     at [age] year old, present at birth  . CESAREAN SECTION    . EYE SURGERY    . FOOT SURGERY    . INTRAOCULAR LENS INSERTION Bilateral   . multiple right eye surgeries    . PLANTAR FASCIA RELEASE    . SPINAL FUSION    . TUBAL LIGATION    . uterine ablation      Current Outpatient Prescriptions  Medication Sig Dispense Refill  . amLODipine-benazepril (LOTREL) 10-40 MG per capsule Take 1 capsule by mouth daily.    . DULoxetine (CYMBALTA) 60 MG capsule Take 1 capsule (60 mg total) by mouth 2 (two) times daily. (Patient taking differently: Take 60 mg by mouth daily. ) 60 capsule 2  . fluocinonide cream (LIDEX) 4.13 % Apply 1 application topically daily. Doesn't use everyday    . FLUTICASONE PROPIONATE,  NASAL, NA Place 2 sprays into the nose as needed.     . lansoprazole (PREVACID) 30 MG capsule Take 30 mg by mouth daily at 12 noon.    . linaclotide (LINZESS) 290 MCG CAPS capsule Take 1 capsule (290 mcg total) by mouth daily before breakfast. 90 capsule 3  . methocarbamol (ROBAXIN) 500 MG tablet Take 500 mg by mouth 3 (three) times daily as needed.    Marland Kitchen OVER THE COUNTER MEDICATION Vitamin for hair daily    . phentermine 37.5 MG capsule Take 37.5 mg by mouth daily.    . prednisoLONE acetate (PRED FORTE) 1 % ophthalmic suspension Place into the right eye daily. 3-6 drops in right eye daily    . Tiotropium Bromide Monohydrate (SPIRIVA HANDIHALER IN) Inhale into the lungs as needed.     . Cholecalciferol (VITAMIN D PO) Take 1 tablet by mouth daily.    . Multiple Vitamin (MULTIVITAMIN) tablet Take 1 tablet by mouth daily.    Marland Kitchen OVER THE COUNTER MEDICATION Vitamin A by mouth daily    . OVER THE COUNTER MEDICATION Lutein blue daily     No current facility-administered medications for this visit.     Allergies as of 03/19/2017 - Review Complete 03/19/2017  Allergen Reaction  Noted  . Doxycycline Shortness Of Breath 12/18/2011  . Methylprednisolone Anaphylaxis 12/13/2015  . Statins Swelling and Other (See Comments) 12/18/2011  . Hydrocodone-acetaminophen Itching 12/13/2015  . Other  12/18/2011    Family History  Problem Relation Age of Onset  . Depression Mother   . Cancer Father        prostate  . Hypertension Father   . Hearing loss Father   . Vision loss Daughter        cataracts  . Stroke Maternal Grandmother   . Other Paternal Grandmother        lung issues  . Vision loss Daughter        cataracts  . Colon cancer Neg Hx   . Colon polyps Neg Hx     Social History   Social History  . Marital status: Divorced    Spouse name: N/A  . Number of children: N/A  . Years of education: N/A   Social History Main Topics  . Smoking status: Former Smoker    Types: Cigarettes    Quit  date: 05/24/2009  . Smokeless tobacco: Never Used  . Alcohol use Yes     Comment: glass of wine occ  . Drug use: No  . Sexual activity: Not Currently    Birth control/ protection: None, Surgical     Comment: tubal and ablation   Other Topics Concern  . None   Social History Narrative  . None    Review of Systems: As mentioned in HPI   Physical Exam: BP 128/86   Pulse 90   Temp (!) 96.5 F (35.8 C) (Oral)   Ht 5\' 3"  (1.6 m)   Wt 177 lb 9.6 oz (80.6 kg)   LMP 02/19/2017 (Approximate)   BMI 31.46 kg/m  General:   Alert and oriented. No distress noted. Pleasant and cooperative.  Head:  Normocephalic and atraumatic. Eyes:  Conjuctiva clear without scleral icterus. Abdomen:  +BS, soft, non-tender and non-distended. No rebound or guarding. No HSM or masses noted. Msk:  Symmetrical without gross deformities. Normal posture. Extremities:  Without edema. Neurologic:  Alert and  oriented x4;  grossly normal neurologically. Psych:  Alert and cooperative. Normal mood and affect.

## 2017-03-19 NOTE — Patient Instructions (Signed)
I am giving you a lab order to check your thyroid. Please take this to [facility] to see if this is already on file. If it is, we will need to get the results.   Please stop Linzess. Start taking the Amitiza one gelcap twice a day with food. Please let me know how it goes!  We will see you in 3 months.

## 2017-03-19 NOTE — Assessment & Plan Note (Signed)
51 year old female with less than ideal results to [place] 290. Appears to cause cramping and has lost effect, working initially but now without good results. Will trial Amitiza 24 mcg po BID. Have also ordered a TSH, as I do not see where this was done recently. Return in 3 months or sooner as needed.

## 2017-03-19 NOTE — Progress Notes (Signed)
cc'ed to pcp °

## 2017-03-20 DIAGNOSIS — Z23 Encounter for immunization: Secondary | ICD-10-CM | POA: Diagnosis not present

## 2017-03-20 DIAGNOSIS — S61402A Unspecified open wound of left hand, initial encounter: Secondary | ICD-10-CM | POA: Diagnosis not present

## 2017-03-24 DIAGNOSIS — W540XXA Bitten by dog, initial encounter: Secondary | ICD-10-CM | POA: Diagnosis not present

## 2017-03-24 DIAGNOSIS — S61412A Laceration without foreign body of left hand, initial encounter: Secondary | ICD-10-CM | POA: Diagnosis not present

## 2017-06-19 ENCOUNTER — Encounter: Payer: Self-pay | Admitting: Gastroenterology

## 2017-06-19 ENCOUNTER — Ambulatory Visit (INDEPENDENT_AMBULATORY_CARE_PROVIDER_SITE_OTHER): Payer: PPO | Admitting: Gastroenterology

## 2017-06-19 VITALS — BP 131/88 | HR 79 | Temp 98.0°F | Ht 63.0 in | Wt 174.0 lb

## 2017-06-19 DIAGNOSIS — K5900 Constipation, unspecified: Secondary | ICD-10-CM | POA: Diagnosis not present

## 2017-06-19 NOTE — Progress Notes (Signed)
cc'ed to pcp °

## 2017-06-19 NOTE — Assessment & Plan Note (Signed)
51 year old with chronic constipation, reportedly normal TSH, with Linzess 290 mcg daily that has worked the best for her (trialed Amitiza already). Add Miralax as needed on any given day if needed, as sometimes Linzess does not provide optimal results. No rectal bleeding. Return in Jan 2019 to discuss initial screening colonoscopy.

## 2017-06-19 NOTE — Progress Notes (Signed)
Referring Provider: Celene Squibb, MD Primary Care Physician:  Celene Squibb, MD Primary GI: Dr. Gala Romney   Chief Complaint  Patient presents with  . Constipation    HPI:   Sara Barnett is a 51 y.o. female presenting today with a history of constipation. Linzess 290 mcg without much improvement. Given Amitiza 24 mcg po BID to trial. TSH was ordered. I do not have these results.  Amitiza made her stomach feel hard. Linzess works some days and not others. Feels like she may not have stimulation sometimes to go. If takes with food, will have diarrhea. No rectal bleeding. As of note, in her last office visit it states rectal bleeding but this is not correct.   Past Medical History:  Diagnosis Date  . Asthma   . Breast tenderness 12/13/2015  . Depression   . Detached retina    right and left eye at different times.   . Fatigue 12/13/2015  . GERD (gastroesophageal reflux disease)   . Hot flashes 12/13/2015  . Hypertension   . Vision loss, bilateral     Past Surgical History:  Procedure Laterality Date  . BACK SURGERY    . cataract surgery     at [age] year old, present at birth  . CESAREAN SECTION    . EYE SURGERY    . FOOT SURGERY    . INTRAOCULAR LENS INSERTION Bilateral   . multiple right eye surgeries    . PLANTAR FASCIA RELEASE    . SPINAL FUSION    . TUBAL LIGATION    . uterine ablation      Current Outpatient Prescriptions  Medication Sig Dispense Refill  . amLODipine-benazepril (LOTREL) 10-40 MG per capsule Take 1 capsule by mouth daily.    . Cholecalciferol (VITAMIN D PO) Take 1 tablet by mouth daily.    . DULoxetine (CYMBALTA) 60 MG capsule Take 1 capsule (60 mg total) by mouth 2 (two) times daily. (Patient taking differently: Take 60 mg by mouth daily. ) 60 capsule 2  . fluocinonide cream (LIDEX) 3.55 % Apply 1 application topically daily. Doesn't use everyday    . FLUTICASONE PROPIONATE, NASAL, NA Place 2 sprays into the nose as needed.     . lansoprazole  (PREVACID) 30 MG capsule Take 30 mg by mouth daily at 12 noon.    . linaclotide (LINZESS) 290 MCG CAPS capsule Take 1 capsule (290 mcg total) by mouth daily before breakfast. 90 capsule 3  . methocarbamol (ROBAXIN) 500 MG tablet Take 500 mg by mouth 3 (three) times daily as needed.    . Multiple Vitamin (MULTIVITAMIN) tablet Take 1 tablet by mouth daily.    Marland Kitchen OVER THE COUNTER MEDICATION Vitamin A by mouth daily    . OVER THE COUNTER MEDICATION Lutein blue daily    . OVER THE COUNTER MEDICATION Vitamin for hair daily    . phentermine 37.5 MG capsule Take 37.5 mg by mouth daily.    . prednisoLONE acetate (PRED FORTE) 1 % ophthalmic suspension Place into the right eye daily. 3-6 drops in right eye daily    . Tiotropium Bromide Monohydrate (SPIRIVA HANDIHALER IN) Inhale into the lungs as needed.      No current facility-administered medications for this visit.     Allergies as of 06/19/2017 - Review Complete 06/19/2017  Allergen Reaction Noted  . Doxycycline Shortness Of Breath 12/18/2011  . Methylprednisolone Anaphylaxis 12/13/2015  . Statins Swelling and Other (See Comments) 12/18/2011  . Hydrocodone-acetaminophen Itching  12/13/2015  . Other  12/18/2011    Family History  Problem Relation Age of Onset  . Depression Mother   . Cancer Father        prostate  . Hypertension Father   . Hearing loss Father   . Vision loss Daughter        cataracts  . Stroke Maternal Grandmother   . Other Paternal Grandmother        lung issues  . Vision loss Daughter        cataracts  . Colon cancer Neg Hx   . Colon polyps Neg Hx     Social History   Social History  . Marital status: Divorced    Spouse name: N/A  . Number of children: N/A  . Years of education: N/A   Social History Main Topics  . Smoking status: Former Smoker    Types: Cigarettes    Quit date: 05/24/2009  . Smokeless tobacco: Never Used  . Alcohol use Yes     Comment: glass of wine occ  . Drug use: No  . Sexual  activity: Not Currently    Birth control/ protection: None, Surgical     Comment: tubal and ablation   Other Topics Concern  . None   Social History Narrative  . None    Review of Systems: As mentioned in HPI   Physical Exam: BP 131/88   Pulse 79   Temp 98 F (36.7 C) (Oral)   Ht 5\' 3"  (1.6 m)   Wt 174 lb (78.9 kg)   BMI 30.82 kg/m  General:   Alert and oriented. No distress noted. Pleasant and cooperative.  Head:  Normocephalic and atraumatic. Eyes:  Conjuctiva clear without scleral icterus. Mouth:  Oral mucosa pink and moist.  Abdomen:  +BS, soft, non-tender and non-distended. No rebound or guarding. No HSM or masses noted. Msk:  Symmetrical without gross deformities. Normal posture. Extremities:  Without edema. Neurologic:  Alert and  oriented x4 Psych:  Alert and cooperative. Normal mood and affect.

## 2017-06-19 NOTE — Patient Instructions (Signed)
Continue the Linzess once daily. Add Miralax as needed.  We will see you in January 2019.

## 2017-06-25 DIAGNOSIS — H02051 Trichiasis without entropian right upper eyelid: Secondary | ICD-10-CM | POA: Diagnosis not present

## 2017-07-01 DIAGNOSIS — Z6830 Body mass index (BMI) 30.0-30.9, adult: Secondary | ICD-10-CM | POA: Diagnosis not present

## 2017-07-01 DIAGNOSIS — G4710 Hypersomnia, unspecified: Secondary | ICD-10-CM | POA: Diagnosis not present

## 2017-07-01 DIAGNOSIS — F419 Anxiety disorder, unspecified: Secondary | ICD-10-CM | POA: Diagnosis not present

## 2017-07-01 DIAGNOSIS — F331 Major depressive disorder, recurrent, moderate: Secondary | ICD-10-CM | POA: Diagnosis not present

## 2017-07-01 DIAGNOSIS — K5900 Constipation, unspecified: Secondary | ICD-10-CM | POA: Diagnosis not present

## 2017-07-03 ENCOUNTER — Ambulatory Visit (INDEPENDENT_AMBULATORY_CARE_PROVIDER_SITE_OTHER): Payer: PPO | Admitting: Otolaryngology

## 2017-07-03 DIAGNOSIS — H6983 Other specified disorders of Eustachian tube, bilateral: Secondary | ICD-10-CM | POA: Diagnosis not present

## 2017-07-03 DIAGNOSIS — J343 Hypertrophy of nasal turbinates: Secondary | ICD-10-CM

## 2017-07-03 DIAGNOSIS — J342 Deviated nasal septum: Secondary | ICD-10-CM | POA: Diagnosis not present

## 2017-07-03 DIAGNOSIS — J310 Chronic rhinitis: Secondary | ICD-10-CM

## 2017-07-04 ENCOUNTER — Other Ambulatory Visit (INDEPENDENT_AMBULATORY_CARE_PROVIDER_SITE_OTHER): Payer: Self-pay | Admitting: Otolaryngology

## 2017-07-04 DIAGNOSIS — J0101 Acute recurrent maxillary sinusitis: Secondary | ICD-10-CM

## 2017-07-10 ENCOUNTER — Encounter: Payer: Self-pay | Admitting: Neurology

## 2017-07-10 ENCOUNTER — Institutional Professional Consult (permissible substitution): Payer: PPO | Admitting: Neurology

## 2017-07-10 ENCOUNTER — Ambulatory Visit (INDEPENDENT_AMBULATORY_CARE_PROVIDER_SITE_OTHER): Payer: PPO | Admitting: Neurology

## 2017-07-10 VITALS — BP 123/85 | HR 77 | Ht 62.0 in | Wt 172.0 lb

## 2017-07-10 DIAGNOSIS — R0683 Snoring: Secondary | ICD-10-CM

## 2017-07-10 DIAGNOSIS — R351 Nocturia: Secondary | ICD-10-CM | POA: Diagnosis not present

## 2017-07-10 DIAGNOSIS — E669 Obesity, unspecified: Secondary | ICD-10-CM

## 2017-07-10 DIAGNOSIS — G4719 Other hypersomnia: Secondary | ICD-10-CM

## 2017-07-10 NOTE — Patient Instructions (Signed)

## 2017-07-10 NOTE — Progress Notes (Signed)
Subjective:    Patient ID: Sara Barnett is a 51 y.o. female.  HPI     Star Age, MD, PhD [facility] [address], [address] [place], [place] [address]  Dear Dr. Nevada Crane,  I saw your patient, Sara Barnett, upon your kind request in my neurologic clinic today for initial consultation of her sleep disorder, in particular, concern for underlying obstructive sleep apnea. The patient is unaccompanied today. As you know, Sara Barnett is a 51 year old right-handed woman with an underlying medical history of hypertension, congenital cataracts, s/p surgeries as a child, lens implants in her 49s, detached retina, hyperlipidemia, impaired fasting glucose, anxiety, depression, asthma, reflux disease, Status post lower back surgery, and obesity, who reports snoring and excessive daytime somnolence. I reviewed your office note from 07/01/2017, which you kindly included. Her Epworth sleepiness score is 14 out of 24 today, fatigue score is 59 out of 63. She lives with her 2 daughters. She is on disability, quit smoking in 2009, drinks alcohol occasionally, caffeine in the form of energy drinks about 1 per day, coffee 2-3 cups, tea occasional.  She denies morning headaches, nocturia is about 1/night. She goes to bed between 10-11 PM and WT is around 7:30 PM. She does not watch TV in bed. She reports sleep talking and denies telltale Sx of RLS and no PLMs are reported.  She takes clonazepam, sometimes at night for sleep. She takes Robaxin prn.   Her Past Medical History Is Significant For: Past Medical History:  Diagnosis Date  . Anxiety   . Asthma   . Breast tenderness 12/13/2015  . Depression   . Detached retina    right and left eye at different times.   . Fatigue 12/13/2015  . GERD (gastroesophageal reflux disease)   . Hot flashes 12/13/2015  . Hypertension   . Mixed hyperlipidemia   . Vision loss, bilateral     Her Past Surgical History Is Significant  For: Past Surgical History:  Procedure Laterality Date  . BACK SURGERY    . cataract surgery     at [age] year old, present at birth  . CESAREAN SECTION    . EYE SURGERY    . FOOT SURGERY    . INTRAOCULAR LENS INSERTION Bilateral   . multiple right eye surgeries    . PLANTAR FASCIA RELEASE    . SPINAL FUSION    . TUBAL LIGATION    . uterine ablation      Her Family History Is Significant For: Family History  Problem Relation Age of Onset  . Depression Mother   . Cancer Father        prostate  . Hypertension Father   . Hearing loss Father   . Vision loss Daughter        cataracts  . Stroke Maternal Grandmother   . Other Paternal Grandmother        lung issues  . Vision loss Daughter        cataracts  . Colon cancer Neg Hx   . Colon polyps Neg Hx     Her Social History Is Significant For: Social History   Social History  . Marital status: Divorced    Spouse name: N/A  . Number of children: N/A  . Years of education: N/A   Social History Main Topics  . Smoking status: Former Smoker    Types: Cigarettes    Quit date: 05/24/2009  . Smokeless tobacco: Never Used  . Alcohol  use Yes     Comment: glass of wine occ  . Drug use: No  . Sexual activity: Not Currently    Birth control/ protection: None, Surgical     Comment: tubal and ablation   Other Topics Concern  . None   Social History Narrative  . None    Her Allergies Are:  Allergies  Allergen Reactions  . Doxycycline Shortness Of Breath  . Methylprednisolone Anaphylaxis    Had PEA arrest/anaphylaxis in operating room after getting methylpred and rocuronium.  Required phenylephrine drip immediately after the that last time she received methylpred.  She OR record from 05/13/2012.    . Statins Swelling and Other (See Comments)    ALL OVER BODY PAIN  . Hydrocodone-Acetaminophen Itching  . Other     "Some kind of steroid"  :   Her Current Medications Are:  Outpatient Encounter Prescriptions as of  07/10/2017  Medication Sig  . amLODipine-benazepril (LOTREL) 10-40 MG per capsule Take 1 capsule by mouth daily.  . clonazePAM (KLONOPIN) 0.5 MG tablet Take 0.5 mg by mouth 2 (two) times daily as needed for anxiety.  . DULoxetine (CYMBALTA) 60 MG capsule Take 60 mg by mouth daily.  Marland Kitchen FLUTICASONE PROPIONATE, NASAL, NA Place 2 sprays into the nose as needed.   . lansoprazole (PREVACID) 30 MG capsule Take 30 mg by mouth daily at 12 noon.  . linaclotide (LINZESS) 290 MCG CAPS capsule Take 1 capsule (290 mcg total) by mouth daily before breakfast.  . methocarbamol (ROBAXIN) 500 MG tablet Take 500 mg by mouth 3 (three) times daily as needed.  . Multiple Vitamin (MULTIVITAMIN) tablet Take 1 tablet by mouth daily.  Marland Kitchen OVER THE COUNTER MEDICATION Vitamin A by mouth daily  . OVER THE COUNTER MEDICATION Lutein blue daily  . OVER THE COUNTER MEDICATION Vitamin for hair daily  . phentermine 37.5 MG capsule Take 37.5 mg by mouth daily.  . prednisoLONE acetate (PRED FORTE) 1 % ophthalmic suspension Place into the right eye daily. 3-6 drops in right eye daily  . traMADol (ULTRAM) 50 MG tablet Take 50 mg by mouth every 6 (six) hours as needed.  . [DISCONTINUED] Cholecalciferol (VITAMIN D PO) Take 1 tablet by mouth daily.  . [DISCONTINUED] fluocinonide cream (LIDEX) 1.91 % Apply 1 application topically daily. Doesn't use everyday  . [DISCONTINUED] Tdap (ADACEL) 02-26-14.5 LF-MCG/0.5 injection Inject 0.5 mLs into the muscle once.  . [DISCONTINUED] Tiotropium Bromide Monohydrate (SPIRIVA HANDIHALER IN) Inhale into the lungs as needed.    No facility-administered encounter medications on file as of 07/10/2017.   :  Review of Systems:  Out of a complete 14 point review of systems, all are reviewed and negative with the exception of these symptoms as listed below: Review of Systems  Neurological:       Pt presents today to discuss her sleep. Pt has never had a sleep study but does endorse snoring. Pt experiences  involuntary movements before falling asleep.  Epworth Sleepiness Scale 0= would never doze 1= slight chance of dozing 2= moderate chance of dozing 3= high chance of dozing  Sitting and reading: 2 Watching TV: 2 Sitting inactive in a public place (ex. Theater or meeting): 1 As a passenger in a car for an hour without a break: 3 Lying down to rest in the afternoon: 3 Sitting and talking to someone: 1 Sitting quietly after lunch (no alcohol): 2 In a car, while stopped in traffic: 0 Total: 14     Objective:  Neurological  Exam  Physical Exam Physical Examination:   Vitals:   07/10/17 1500  BP: 123/85  Pulse: 77    General Examination: The patient is a very pleasant 51 y.o. female in no acute distress. She appears well-developed and well-nourished and well groomed.   HEENT: Normocephalic, atraumatic, irregular and unequal pupils, not clearly reactive.  Legally blind b/l. Extraocular tracking is good without limitation to gaze excursion or nystagmus noted. Normal smooth pursuit is noted. Hearing is grossly intact. Tympanic membranes are clear bilaterally. Face is symmetric with normal facial animation and normal facial sensation. Speech is clear with no dysarthria noted. There is no hypophonia. There is no lip, neck/head, jaw or voice tremor. Neck is supple with full range of passive and active motion. There are no carotid bruits on auscultation. Oropharynx exam reveals: moderate mouth dryness, adequate dental hygiene and moderate airway crowding, due to small airway, tonsils are in but small. Mallampati is class III. Tongue protrudes centrally and palate elevates symmetrically. Neck size is 14.25 inches. She has a Mild overbite.    Chest: Clear to auscultation without wheezing, rhonchi or crackles noted.  Heart: S1+S2+0, regular and normal without murmurs, rubs or gallops noted.   Abdomen: Soft, non-tender and non-distended with normal bowel sounds appreciated on  auscultation.  Extremities: There is no pitting edema in the distal lower extremities bilaterally. Pedal pulses are intact.  Skin: Warm and dry without trophic changes noted.  Musculoskeletal: exam reveals no obvious joint deformities, tenderness or joint swelling or erythema.   Neurologically:  Mental status: The patient is awake, alert and oriented in all 4 spheres. Her immediate and remote memory, attention, language skills and fund of knowledge are appropriate. There is no evidence of aphasia, agnosia, apraxia or anomia. Speech is clear with normal prosody and enunciation. Thought process is linear. Mood is normal and affect is normal.  Cranial nerves II - XII are as described above under HEENT exam. In addition: shoulder shrug is normal with equal shoulder height noted. Motor exam: Normal bulk, strength and tone is noted. There is no drift, tremor or rebound. Romberg is negative. Reflexes are 2+ throughout. Fine motor skills and coordination: intact with normal finger taps, normal hand movements, normal rapid alternating patting, normal foot taps and normal foot agility.  Cerebellar testing: No dysmetria or intention tremor on finger to nose testing. Heel to shin is unremarkable bilaterally. There is no truncal or gait ataxia.  Sensory exam: intact to light touch in the upper and lower extremities.  Gait, station and balance: She stands easily. No veering to one side is noted. No leaning to one side is noted. Posture is age-appropriate and stance is narrow based. Gait shows normal stride length and normal pace. No problems turning are noted. Tandem walk is unremarkable.   Assessment and Plan:  In summary, Sara Barnett is a very pleasant 51 y.o.-year old female with an underlying medical history of hypertension, congenital cataracts, s/p surgeries as a child, lens implants in her 59s, detached retina, hyperlipidemia, impaired fasting glucose, anxiety, depression, asthma, reflux disease,  Status post lower back surgery, and obesity, whose history and physical exam are concerning for concerning for obstructive sleep apnea (OSA). I had a long chat with the patient about my findings and the diagnosis of OSA, its prognosis and treatment options. We talked about medical treatments, surgical interventions and non-pharmacological approaches. I explained in particular the risks and ramifications of untreated moderate to severe OSA, especially with respect to developing cardiovascular disease down  the Road, including congestive heart failure, difficult to treat hypertension, cardiac arrhythmias, or stroke. Even type 2 diabetes has, in part, been linked to untreated OSA. Symptoms of untreated OSA include daytime sleepiness, memory problems, mood irritability and mood disorder such as depression and anxiety, lack of energy, as well as recurrent headaches, especially morning headaches. We talked about trying to maintain a healthy lifestyle in general, as well as the importance of weight control. I encouraged the patient to eat healthy, exercise daily and keep well hydrated, to keep a scheduled bedtime and wake time routine, to not skip any meals and eat healthy snacks in between meals. I advised the patient not to drive when feeling sleepy. I recommended the following at this time: sleep study with potential positive airway pressure titration. (We will score hypopneas at 4%).   I explained the sleep test procedure to the patient and also outlined possible surgical and non-surgical treatment options of OSA, including the use of a custom-made dental device (which would require a referral to a specialist dentist or oral surgeon), upper airway surgical options, such as pillar implants, radiofrequency surgery, tongue base surgery, and UPPP (which would involve a referral to an ENT surgeon). Rarely, jaw surgery such as mandibular advancement may be considered.  I also explained the CPAP treatment option to the  patient, who indicated that she would be willing to try CPAP if the need arises. I explained the importance of being compliant with PAP treatment, not only for insurance purposes but primarily to improve Her symptoms, and for the patient's long term health benefit, including to reduce Her cardiovascular risks. I answered all her questions today and the patient was in agreement. I would like to see her back after the sleep study is completed and encouraged her to call with any interim questions, concerns, problems or updates.   Thank you very much for allowing me to participate in the care of this nice patient. If I can be of any further assistance to you please do not hesitate to call me at [phone].  Sincerely,   Star Age, MD, PhD

## 2017-07-15 ENCOUNTER — Ambulatory Visit (HOSPITAL_COMMUNITY): Payer: PPO

## 2017-07-24 ENCOUNTER — Ambulatory Visit (INDEPENDENT_AMBULATORY_CARE_PROVIDER_SITE_OTHER): Payer: Self-pay | Admitting: Otolaryngology

## 2017-07-28 ENCOUNTER — Ambulatory Visit (HOSPITAL_COMMUNITY)
Admission: RE | Admit: 2017-07-28 | Discharge: 2017-07-28 | Disposition: A | Discharge status: Home / Self Care | Payer: PPO | Source: Ambulatory Visit | Attending: Otolaryngology | Admitting: Otolaryngology

## 2017-07-28 DIAGNOSIS — J329 Chronic sinusitis, unspecified: Secondary | ICD-10-CM | POA: Diagnosis not present

## 2017-07-28 DIAGNOSIS — J320 Chronic maxillary sinusitis: Secondary | ICD-10-CM

## 2017-07-28 DIAGNOSIS — J3489 Other specified disorders of nose and nasal sinuses: Secondary | ICD-10-CM

## 2017-07-28 DIAGNOSIS — J0101 Acute recurrent maxillary sinusitis: Secondary | ICD-10-CM | POA: Diagnosis not present

## 2017-07-28 DIAGNOSIS — J342 Deviated nasal septum: Secondary | ICD-10-CM

## 2017-07-28 DIAGNOSIS — J322 Chronic ethmoidal sinusitis: Secondary | ICD-10-CM

## 2017-07-28 HISTORY — DX: Chronic ethmoidal sinusitis: J32.2

## 2017-07-28 HISTORY — DX: Chronic maxillary sinusitis: J32.0

## 2017-07-28 HISTORY — DX: Other specified disorders of nose and nasal sinuses: J34.89

## 2017-07-28 HISTORY — DX: Deviated nasal septum: J34.2

## 2017-07-31 ENCOUNTER — Ambulatory Visit (INDEPENDENT_AMBULATORY_CARE_PROVIDER_SITE_OTHER): Payer: PPO | Admitting: Otolaryngology

## 2017-07-31 DIAGNOSIS — J320 Chronic maxillary sinusitis: Secondary | ICD-10-CM | POA: Diagnosis not present

## 2017-07-31 DIAGNOSIS — J343 Hypertrophy of nasal turbinates: Secondary | ICD-10-CM

## 2017-07-31 DIAGNOSIS — J342 Deviated nasal septum: Secondary | ICD-10-CM

## 2017-07-31 DIAGNOSIS — J322 Chronic ethmoidal sinusitis: Secondary | ICD-10-CM | POA: Diagnosis not present

## 2017-08-04 ENCOUNTER — Other Ambulatory Visit: Payer: Self-pay | Admitting: Neurology

## 2017-08-04 DIAGNOSIS — G4733 Obstructive sleep apnea (adult) (pediatric): Secondary | ICD-10-CM

## 2017-08-07 DIAGNOSIS — H33021 Retinal detachment with multiple breaks, right eye: Secondary | ICD-10-CM | POA: Diagnosis not present

## 2017-08-07 DIAGNOSIS — H3521 Other non-diabetic proliferative retinopathy, right eye: Secondary | ICD-10-CM | POA: Diagnosis not present

## 2017-08-07 DIAGNOSIS — Z8669 Personal history of other diseases of the nervous system and sense organs: Secondary | ICD-10-CM | POA: Diagnosis not present

## 2017-08-13 ENCOUNTER — Other Ambulatory Visit: Payer: Self-pay | Admitting: Otolaryngology

## 2017-08-18 ENCOUNTER — Encounter (HOSPITAL_BASED_OUTPATIENT_CLINIC_OR_DEPARTMENT_OTHER): Payer: Self-pay | Admitting: *Deleted

## 2017-08-18 NOTE — Pre-Procedure Instructions (Signed)
Surgery and anesthesia notes from 2014 surgery (anaphylactic event/cardiac arrest/PEA) reviewed by Dr. Seward Speck; pt. OK to come for surgery.

## 2017-08-25 ENCOUNTER — Ambulatory Visit (HOSPITAL_BASED_OUTPATIENT_CLINIC_OR_DEPARTMENT_OTHER): Payer: PPO | Admitting: Anesthesiology

## 2017-08-25 ENCOUNTER — Ambulatory Visit (HOSPITAL_BASED_OUTPATIENT_CLINIC_OR_DEPARTMENT_OTHER)
Admission: RE | Admit: 2017-08-25 | Discharge: 2017-08-25 | Disposition: A | Discharge status: Home / Self Care | Payer: PPO | Source: Ambulatory Visit | Attending: Otolaryngology | Admitting: Otolaryngology

## 2017-08-25 ENCOUNTER — Encounter (HOSPITAL_BASED_OUTPATIENT_CLINIC_OR_DEPARTMENT_OTHER)
Admission: RE | Disposition: A | Discharge status: Home / Self Care | Payer: Self-pay | Source: Ambulatory Visit | Attending: Otolaryngology

## 2017-08-25 ENCOUNTER — Encounter (HOSPITAL_BASED_OUTPATIENT_CLINIC_OR_DEPARTMENT_OTHER): Payer: Self-pay

## 2017-08-25 DIAGNOSIS — J342 Deviated nasal septum: Secondary | ICD-10-CM | POA: Insufficient documentation

## 2017-08-25 DIAGNOSIS — I739 Peripheral vascular disease, unspecified: Secondary | ICD-10-CM | POA: Diagnosis not present

## 2017-08-25 DIAGNOSIS — J329 Chronic sinusitis, unspecified: Secondary | ICD-10-CM | POA: Diagnosis not present

## 2017-08-25 DIAGNOSIS — Z87891 Personal history of nicotine dependence: Secondary | ICD-10-CM | POA: Diagnosis not present

## 2017-08-25 DIAGNOSIS — J322 Chronic ethmoidal sinusitis: Secondary | ICD-10-CM | POA: Diagnosis not present

## 2017-08-25 DIAGNOSIS — I10 Essential (primary) hypertension: Secondary | ICD-10-CM | POA: Insufficient documentation

## 2017-08-25 DIAGNOSIS — J343 Hypertrophy of nasal turbinates: Secondary | ICD-10-CM | POA: Diagnosis not present

## 2017-08-25 DIAGNOSIS — J320 Chronic maxillary sinusitis: Secondary | ICD-10-CM | POA: Insufficient documentation

## 2017-08-25 DIAGNOSIS — J338 Other polyp of sinus: Secondary | ICD-10-CM | POA: Diagnosis not present

## 2017-08-25 DIAGNOSIS — M1990 Unspecified osteoarthritis, unspecified site: Secondary | ICD-10-CM | POA: Insufficient documentation

## 2017-08-25 HISTORY — PX: SINUS ENDO W/FUSION: SHX777

## 2017-08-25 HISTORY — PX: MAXILLARY ANTROSTOMY: SHX2003

## 2017-08-25 HISTORY — DX: Personal history of other diseases of the circulatory system: Z86.79

## 2017-08-25 HISTORY — DX: Adverse effect of unspecified anesthetic, initial encounter: T41.45XA

## 2017-08-25 HISTORY — DX: Chronic ethmoidal sinusitis: J32.2

## 2017-08-25 HISTORY — DX: Microstomia: Q18.5

## 2017-08-25 HISTORY — PX: NASAL SEPTOPLASTY W/ TURBINOPLASTY: SHX2070

## 2017-08-25 HISTORY — DX: Other complications of anesthesia, initial encounter: T88.59XA

## 2017-08-25 HISTORY — PX: ETHMOIDECTOMY: SHX5197

## 2017-08-25 HISTORY — DX: Hypertrophy of nasal turbinates: J34.3

## 2017-08-25 HISTORY — DX: Constipation, unspecified: K59.00

## 2017-08-25 HISTORY — DX: Legal blindness, as defined in USA: H54.8

## 2017-08-25 HISTORY — PX: ENDOSCOPIC CONCHA BULLOSA RESECTION: SHX6395

## 2017-08-25 HISTORY — DX: Other specified disorders of nose and nasal sinuses: J34.89

## 2017-08-25 HISTORY — DX: Chronic maxillary sinusitis: J32.0

## 2017-08-25 HISTORY — DX: Rash and other nonspecific skin eruption: R21

## 2017-08-25 HISTORY — DX: Deviated nasal septum: J34.2

## 2017-08-25 HISTORY — DX: Unspecified osteoarthritis, unspecified site: M19.90

## 2017-08-25 SURGERY — SEPTOPLASTY, NOSE, WITH NASAL TURBINATE REDUCTION
Anesthesia: General | Site: Nose | Laterality: Bilateral

## 2017-08-25 MED ORDER — MEPERIDINE HCL 25 MG/ML IJ SOLN: 6.2500 mg | INTRAMUSCULAR | Status: DC | PRN

## 2017-08-25 MED ORDER — CEFAZOLIN SODIUM-DEXTROSE 2-3 GM-%(50ML) IV SOLR
INTRAVENOUS | Status: DC | PRN
  Administered 2017-08-25: 2 g via INTRAVENOUS

## 2017-08-25 MED ORDER — ONDANSETRON HCL 4 MG/2ML IJ SOLN
INTRAMUSCULAR | Status: AC
  Filled 2017-08-25: qty 2

## 2017-08-25 MED ORDER — OXYCODONE HCL 5 MG PO TABS
5.0000 mg | ORAL_TABLET | Freq: Once | ORAL | Status: AC
  Administered 2017-08-25: 5 mg via ORAL

## 2017-08-25 MED ORDER — HYDROMORPHONE HCL 1 MG/ML IJ SOLN
0.2500 mg | INTRAMUSCULAR | Status: DC | PRN
  Administered 2017-08-25: 0.5 mg via INTRAVENOUS

## 2017-08-25 MED ORDER — CLINDAMYCIN HCL 300 MG PO CAPS: 300.0000 mg | ORAL_CAPSULE | Freq: Three times a day (TID) | ORAL | 0 refills | Status: AC

## 2017-08-25 MED ORDER — OXYCODONE-ACETAMINOPHEN 5-325 MG PO TABS: 1.0000 | ORAL_TABLET | ORAL | 0 refills | Status: DC | PRN

## 2017-08-25 MED ORDER — OXYMETAZOLINE HCL 0.05 % NA SOLN
NASAL | Status: DC | PRN
  Administered 2017-08-25: 1 via TOPICAL

## 2017-08-25 MED ORDER — MIDAZOLAM HCL 2 MG/2ML IJ SOLN
1.0000 mg | INTRAMUSCULAR | Status: DC | PRN
  Administered 2017-08-25: 2 mg via INTRAVENOUS

## 2017-08-25 MED ORDER — LIDOCAINE-EPINEPHRINE 1 %-1:100000 IJ SOLN
INTRAMUSCULAR | Status: DC | PRN
  Administered 2017-08-25: 4.5 mL

## 2017-08-25 MED ORDER — CEFAZOLIN SODIUM-DEXTROSE 2-4 GM/100ML-% IV SOLN
INTRAVENOUS | Status: AC
  Filled 2017-08-25: qty 100

## 2017-08-25 MED ORDER — SCOPOLAMINE 1 MG/3DAYS TD PT72
1.0000 | MEDICATED_PATCH | Freq: Once | TRANSDERMAL | Status: AC | PRN
  Administered 2017-08-25: 1 via TRANSDERMAL

## 2017-08-25 MED ORDER — OXYCODONE HCL 5 MG PO TABS
ORAL_TABLET | ORAL | Status: AC
  Filled 2017-08-25: qty 1

## 2017-08-25 MED ORDER — MIDAZOLAM HCL 2 MG/2ML IJ SOLN
INTRAMUSCULAR | Status: AC
  Filled 2017-08-25: qty 2

## 2017-08-25 MED ORDER — PROMETHAZINE HCL 25 MG/ML IJ SOLN: 6.2500 mg | INTRAMUSCULAR | Status: DC | PRN

## 2017-08-25 MED ORDER — PHENYLEPHRINE 40 MCG/ML (10ML) SYRINGE FOR IV PUSH (FOR BLOOD PRESSURE SUPPORT)
PREFILLED_SYRINGE | INTRAVENOUS | Status: DC | PRN
  Administered 2017-08-25 (×3): 80 ug via INTRAVENOUS

## 2017-08-25 MED ORDER — LACTATED RINGERS IV SOLN
INTRAVENOUS | Status: DC
  Administered 2017-08-25 (×4): via INTRAVENOUS

## 2017-08-25 MED ORDER — SCOPOLAMINE 1 MG/3DAYS TD PT72
MEDICATED_PATCH | TRANSDERMAL | Status: AC
  Filled 2017-08-25: qty 1

## 2017-08-25 MED ORDER — PHENYLEPHRINE 40 MCG/ML (10ML) SYRINGE FOR IV PUSH (FOR BLOOD PRESSURE SUPPORT)
PREFILLED_SYRINGE | INTRAVENOUS | Status: AC
  Filled 2017-08-25: qty 10

## 2017-08-25 MED ORDER — SODIUM CHLORIDE 0.9 % IR SOLN
Status: DC | PRN
  Administered 2017-08-25: 400 mL

## 2017-08-25 MED ORDER — FENTANYL CITRATE (PF) 100 MCG/2ML IJ SOLN
INTRAMUSCULAR | Status: AC
  Filled 2017-08-25: qty 2

## 2017-08-25 MED ORDER — LIDOCAINE 2% (20 MG/ML) 5 ML SYRINGE
INTRAMUSCULAR | Status: AC
  Filled 2017-08-25: qty 5

## 2017-08-25 MED ORDER — MUPIROCIN 2 % EX OINT
TOPICAL_OINTMENT | CUTANEOUS | Status: DC | PRN
  Administered 2017-08-25: 1 via TOPICAL

## 2017-08-25 MED ORDER — ONDANSETRON HCL 4 MG/2ML IJ SOLN
INTRAMUSCULAR | Status: DC | PRN
  Administered 2017-08-25 (×2): 4 mg via INTRAVENOUS

## 2017-08-25 MED ORDER — SUCCINYLCHOLINE CHLORIDE 200 MG/10ML IV SOSY
PREFILLED_SYRINGE | INTRAVENOUS | Status: DC | PRN
  Administered 2017-08-25: 100 mg via INTRAVENOUS

## 2017-08-25 MED ORDER — DEXAMETHASONE SODIUM PHOSPHATE 10 MG/ML IJ SOLN
INTRAMUSCULAR | Status: AC
  Filled 2017-08-25: qty 1

## 2017-08-25 MED ORDER — SUCCINYLCHOLINE CHLORIDE 200 MG/10ML IV SOSY
PREFILLED_SYRINGE | INTRAVENOUS | Status: AC
  Filled 2017-08-25: qty 10

## 2017-08-25 MED ORDER — FENTANYL CITRATE (PF) 100 MCG/2ML IJ SOLN
50.0000 ug | INTRAMUSCULAR | Status: DC | PRN
  Administered 2017-08-25: 100 ug via INTRAVENOUS

## 2017-08-25 MED ORDER — PROPOFOL 10 MG/ML IV BOLUS
INTRAVENOUS | Status: DC | PRN
  Administered 2017-08-25: 180 mg via INTRAVENOUS

## 2017-08-25 MED ORDER — HYDROMORPHONE HCL 1 MG/ML IJ SOLN
INTRAMUSCULAR | Status: AC
  Filled 2017-08-25: qty 0.5

## 2017-08-25 SURGICAL SUPPLY — 67 items
ATTRACTOMAT 16X20 MAGNETIC DRP (DRAPES) IMPLANT
BLADE RAD40 ROTATE 4M 4 5PK (BLADE) IMPLANT
BLADE RAD40 ROTATE 4M 4MM 5PK (BLADE)
BLADE RAD60 ROTATE M4 4 5PK (BLADE) IMPLANT
BLADE RAD60 ROTATE M4 4MM 5PK (BLADE)
BLADE ROTATE RAD 12 4 M4 (BLADE) IMPLANT
BLADE ROTATE RAD 12 4MM M4 (BLADE)
BLADE ROTATE RAD 40 4 M4 (BLADE) IMPLANT
BLADE ROTATE RAD 40 4MM M4 (BLADE)
BLADE ROTATE TRICUT 4MX13CM M4 (BLADE) ×1
BLADE ROTATE TRICUT 4X13 M4 (BLADE) ×2 IMPLANT
BLADE TRICUT ROTATE M4 4 5PK (BLADE) IMPLANT
BLADE TRICUT ROTATE M4 4MM 5PK (BLADE)
BUR HS RAD FRONTAL 3 (BURR) IMPLANT
BUR HS RAD FRONTAL 3MM (BURR)
CANISTER SUC SOCK COL 7IN (MISCELLANEOUS) ×3 IMPLANT
CANISTER SUCT 1200ML W/VALVE (MISCELLANEOUS) ×3 IMPLANT
COAGULATOR SUCT 6 FR SWTCH (ELECTROSURGICAL)
COAGULATOR SUCT 8FR VV (MISCELLANEOUS) ×3 IMPLANT
COAGULATOR SUCT SWTCH 10FR 6 (ELECTROSURGICAL) IMPLANT
DECANTER SPIKE VIAL GLASS SM (MISCELLANEOUS) IMPLANT
DRSG NASAL KENNEDY LMNT 8CM (GAUZE/BANDAGES/DRESSINGS) IMPLANT
DRSG NASOPORE 8CM (GAUZE/BANDAGES/DRESSINGS) ×2 IMPLANT
DRSG TELFA 3X8 NADH (GAUZE/BANDAGES/DRESSINGS) IMPLANT
ELECT REM PT RETURN 9FT ADLT (ELECTROSURGICAL) ×3
ELECTRODE REM PT RTRN 9FT ADLT (ELECTROSURGICAL) ×1 IMPLANT
GLOVE BIO SURGEON STRL SZ7.5 (GLOVE) ×3 IMPLANT
GLOVE SURG SS PI 7.0 STRL IVOR (GLOVE) ×2 IMPLANT
GOWN STRL REUS W/ TWL LRG LVL3 (GOWN DISPOSABLE) ×2 IMPLANT
GOWN STRL REUS W/TWL LRG LVL3 (GOWN DISPOSABLE) ×6
HEMOSTAT SURGICEL 2X14 (HEMOSTASIS) IMPLANT
IV NS 1000ML (IV SOLUTION)
IV NS 1000ML BAXH (IV SOLUTION) IMPLANT
IV NS 500ML (IV SOLUTION) ×3
IV NS 500ML BAXH (IV SOLUTION) ×1 IMPLANT
IV SET EXT 30 76VOL 4 MALE LL (IV SETS) IMPLANT
NDL HYPO 25X1 1.5 SAFETY (NEEDLE) ×1 IMPLANT
NDL SPNL 25GX3.5 QUINCKE BL (NEEDLE) IMPLANT
NEEDLE HYPO 25X1 1.5 SAFETY (NEEDLE) ×3 IMPLANT
NEEDLE SPNL 25GX3.5 QUINCKE BL (NEEDLE) IMPLANT
NS IRRIG 1000ML POUR BTL (IV SOLUTION) ×3 IMPLANT
PACK BASIN DAY SURGERY FS (CUSTOM PROCEDURE TRAY) ×3 IMPLANT
PACK ENT DAY SURGERY (CUSTOM PROCEDURE TRAY) ×3 IMPLANT
PACKING NASAL EPIS 4X2.4 XEROG (MISCELLANEOUS) IMPLANT
PAD DRESSING TELFA 3X8 NADH (GAUZE/BANDAGES/DRESSINGS) IMPLANT
SLEEVE SCD COMPRESS KNEE MED (MISCELLANEOUS) ×2 IMPLANT
SOLUTION BUTLER CLEAR DIP (MISCELLANEOUS) ×3 IMPLANT
SPLINT NASAL AIRWAY SILICONE (MISCELLANEOUS) ×2 IMPLANT
SPONGE GAUZE 2X2 8PLY STER LF (GAUZE/BANDAGES/DRESSINGS) ×1
SPONGE GAUZE 2X2 8PLY STRL LF (GAUZE/BANDAGES/DRESSINGS) ×2 IMPLANT
SPONGE NEURO XRAY DETECT 1X3 (DISPOSABLE) ×3 IMPLANT
SUT CHROMIC 4 0 P 3 18 (SUTURE) ×3 IMPLANT
SUT ETHILON 3 0 PS 1 (SUTURE) IMPLANT
SUT PLAIN 4 0 ~~LOC~~ 1 (SUTURE) ×3 IMPLANT
SUT PROLENE 3 0 PS 2 (SUTURE) ×3 IMPLANT
SUT VIC AB 4-0 P-3 18XBRD (SUTURE) IMPLANT
SUT VIC AB 4-0 P3 18 (SUTURE)
SYR 3ML 23GX1 SAFETY (SYRINGE) IMPLANT
TOWEL OR 17X24 6PK STRL BLUE (TOWEL DISPOSABLE) ×3 IMPLANT
TRACKER ENT INSTRUMENT (MISCELLANEOUS) ×3 IMPLANT
TRACKER ENT PATIENT (MISCELLANEOUS) ×3 IMPLANT
TUBE CONNECTING 20'X1/4 (TUBING) ×1
TUBE CONNECTING 20X1/4 (TUBING) ×2 IMPLANT
TUBE SALEM SUMP 12R W/ARV (TUBING) IMPLANT
TUBE SALEM SUMP 16 FR W/ARV (TUBING) ×3 IMPLANT
TUBING STRAIGHTSHOT EPS 5PK (TUBING) IMPLANT
YANKAUER SUCT BULB TIP NO VENT (SUCTIONS) ×3 IMPLANT

## 2017-08-25 NOTE — Op Note (Signed)
DATE OF PROCEDURE: 08/25/2017  OPERATIVE REPORT   SURGEON: Leta Baptist, MD   PREOPERATIVE DIAGNOSES:  1. Severe nasal septal deviation.  2. Bilateral inferior turbinate hypertrophy.  3. Bilateral concha bullosa. 4. Bilateral chronic maxillary and ethmoid sinusitis.  POSTOPERATIVE DIAGNOSES:  1. Severe nasal septal deviation.  2. Bilateral inferior turbinate hypertrophy.  3. Bilateral concha bullosa. 4. Bilateral chronic maxillary and ethmoid sinusitis.  PROCEDURE PERFORMED:  1. Septoplasty.  2. Bilateral partial inferior turbinate resection.  3. Bilateral endoscopic concha bullosa resection. 4. Bilateral endoscopic total ethmoidectomy. 5. Bilateral endoscopic maxillary antrostomy with tissue removal. 6. FUSION stereotactic image guidance.  ANESTHESIA: General endotracheal tube anesthesia.   COMPLICATIONS: None.   ESTIMATED BLOOD LOSS: 150 mL.   INDICATION FOR PROCEDURE: Sara Barnett is a 51 y.o. female with a history of chronic nasal obstruction and recurrent rhinosinusitis. The patient was  treated with multiple courses of antibiotics, antihistamine, decongestant, steroid nasal spray, and systemic steroids. However, the patient continues to be symptomatic. On examination, the patient was noted to have bilateral severe inferior turbinate hypertrophy and significant nasal septal deviation, causing significant nasal obstruction. On her CT scan, the patient was noted to have mucosal thickening of her bilateral maxillary and ethmoid sinuses. She also has bilateral concha bullosa. Based on the above findings, the decision was made for the patient to undergo the above-stated procedures. The risks, benefits, alternatives, and details of the procedures were discussed with the patient. Questions were invited and answered. Informed consent was obtained.   DESCRIPTION OF PROCEDURE: The patient was taken to the operating room and placed supine on the operating table. General endotracheal tube  anesthesia was administered by the anesthesiologist. The patient was positioned, and prepped and draped in the standard fashion for nasal surgery. Pledgets soaked with Afrin were placed in both nasal cavities for decongestion. The pledgets were subsequently removed. The FUSION stereotactic image guidance marker was placed. The image guidance system was functional throughout the case.  1% lidocaine with 1:100,000 epinephrine was injected onto the nasal septum bilaterally. A hemitransfixion incision was made on the left side. The mucosal flap was carefully elevated on the left side. A cartilaginous incision was made 1 cm superior to the caudal margin of the nasal septum. Mucosal flap was also elevated on the right side in the similar fashion. It should be noted that due to the severe septal deviation, the deviated portion of the cartilaginous and bony septum had to be removed in piecemeal fashion. Once the deviated portions were removed, a straight midline septum was achieved. The septum was then quilted with 4-0 plain gut sutures. The hemitransfixion incision was closed with interrupted 4-0 chromic sutures.   The inferior one half of both hypertrophied inferior turbinate was crossclamped with a Kelly clamp. The inferior one half of each inferior turbinate was then resected with a pair of cross cutting scissors. Hemostasis was achieved with a suction cautery device.   Attention was focused on the left nasal cavity. Using a 0 scope, the left middle turbinate/ concha bullosa was noted. The inferior one third of the concha bullosa was resected using a combination of Tru-Cut forceps and microdebrider. The rest of the middle turbinate was carefully medialized. The uncinate process was resected with a freer elevator. The maxillary antrum was entered and enlarged using a combination of backbiter and microdebrider. Polypoid tissue was removed from the maxillary sinus. The bony partitions of the anterior and posterior  ethmoid cavities were taken down and removed. Polypoid tissue was removed  from the ethmoid sinuses using the microdebrider. The sinuses were copiously irrigated. Hemostasis was achieved using the nasopore packing.   The same procedure was repeated on the right side without exceptions. Similar findings were again noted. Doyle splints were applied to the nasal septum and sutured in place.   The care of the patient was turned over to the anesthesiologist. The patient was awakened from anesthesia without difficulty. The patient was extubated and transferred to the recovery room in good condition.   OPERATIVE FINDINGS: Severe nasal septal deviation, bilateral concha bullosa, and bilateral inferior turbinate hypertrophy. Bilateral chronic maxillary and ethmoid sinusitis, with polypoid mucosa  filling the sinus cavities.   SPECIMEN: Bilateral sinus contents.   FOLLOWUP CARE: The patient be discharged home once she is awake and alert. The patient will be placed on Percocet 1 tablets p.o. q.4 hours p.r.n. pain, and clindamycin 300 mg by mouth 3 times a day for 5 days. The patient will follow up in my office in approximately 1 week for splint removal.   Bralen Wiltgen Raynelle Bring, MD

## 2017-08-25 NOTE — Transfer of Care (Signed)
Immediate Anesthesia Transfer of Care Note  Patient: Sara Barnett  Procedure(s) Performed: NASAL SEPTOPLASTY WITH BILATERAL TURBINATE REDUCTION (Bilateral Nose) BILATERAL ENDOSCOPIC CONCHA BULLOSA RESECTION (Bilateral Nose) BILATERAL TOTAL ETHMOIDECTOMY (Bilateral Nose) BILATERAL MAXILLARY ANTROSTOMY WITH TISSUE REMOVAL (Bilateral Nose) ENDOSCOPIC SINUS SURGERY WITH FUSION NAVIGATION (Bilateral Nose)  Patient Location: PACU  Anesthesia Type:General  Level of Consciousness: awake, sedated and patient cooperative  Airway & Oxygen Therapy: Patient Spontanous Breathing and aerosol face mask  Post-op Assessment: Report given to RN and Post -op Vital signs reviewed and stable  Post vital signs: Reviewed and stable  Last Vitals:  Vitals:   08/25/17 0752 08/25/17 1130  BP: 121/77 (!) 132/98  Pulse: 65 (!) 101  Resp: 18 16  Temp: 36.5 C   SpO2: 97% 94%    Last Pain:  Vitals:   08/25/17 0752  TempSrc: Oral         Complications: No apparent anesthesia complications

## 2017-08-25 NOTE — H&P (Signed)
Cc: Recurrent sinusitis, nasal obstruction  HPI: The patient is a 51 year old female who returns today for her follow-up evaluation.  The patient has a history of frequent recurrent sinusitis.  She was last seen 1 month ago. At that time, she was noted to have nasal septal deviation and chronic rhinitis.  She was treated with Flonase nasal spray and Mucinex.  She also underwent a CT scan.  The CT scan showed mucosal thickening of her bilateral maxillary and ethmoid sinuses, worse on the left side.  The patient also has significant nasal septal deviation and bilateral inferior turbinate hypertrophy.  Bilateral concha bullosa was also noted on a CT scan.  The patient returns today complaining of persistent pressure and discomfort of her mid face and forehead.  She complains she has been symptomatic for more than 1 year.  She also reports increasing nasal obstruction, worse on the left side.   Exam: The nasal cavities were decongested and anesthetised with a combination of oxymetazoline and 4% lidocaine solution.  The flexible scope was inserted into the right nasal cavity.  Endoscopy of the inferior and middle meatus was performed.  Edematous and inflammed mucosa was noted.  NSD noted. No polyp, mass, or lesion was appreciated.  Olfactory cleft was clear.  Nasopharynx was clear.  Turbinates were hypertrophied but without mass.  Incomplete response to decongestion.  The procedure was repeated on the contralateral side with similar findings.  The patient tolerated the procedure well.  Instructions were given to avoid eating or drinking for 2 hours.    Assessment: 1.  Bilateral chronic rhinosinusitis, involving both maxillary and ethmoid sinuses.  The inflammation is worse on the left side.  2.  Leftward septal deviation with a large left septal spur.  3.  Bilateral inferior turbinate hypertrophy and bilateral concha bullosa.    Plan: 1.  The nasal endoscopy findings and the CT images are reviewed with the  patient.  2.  I would like to place the patient on Augmentin 875 mg p.o. b.i.d. for 14 days.   3.  Continue with Flonase nasal spray and Mucinex.   4.  The patient will return for re-evaluation in 3 weeks.  If she continues to be symptomatic, she may benefit from undergoing bilateral endoscopic sinus surgery, septoplasty, bilateral turbinate reduction, and bilateral concha bullosa resection.

## 2017-08-25 NOTE — Anesthesia Procedure Notes (Signed)
Procedure Name: Intubation Date/Time: 08/25/2017 9:34 AM Performed by: Lyndee Leo Pre-anesthesia Checklist: Patient identified, Emergency Drugs available, Suction available and Patient being monitored Patient Re-evaluated:Patient Re-evaluated prior to induction Oxygen Delivery Method: Circle system utilized Preoxygenation: Pre-oxygenation with 100% oxygen Induction Type: IV induction Ventilation: Mask ventilation without difficulty Laryngoscope Size: Miller and 2 Tube type: Oral Rae Tube size: 7.0 mm Number of attempts: 1 Airway Equipment and Method: Stylet and Oral airway Placement Confirmation: ETT inserted through vocal cords under direct vision,  positive ETCO2 and breath sounds checked- equal and bilateral Secured at: 19 cm Tube secured with: Tape Dental Injury: Teeth and Oropharynx as per pre-operative assessment

## 2017-08-25 NOTE — Discharge Instructions (Addendum)

## 2017-08-25 NOTE — Anesthesia Preprocedure Evaluation (Signed)
Anesthesia Evaluation  Patient identified by MRN, date of birth, ID band Patient awake    Reviewed: Allergy & Precautions, NPO status , Patient's Chart, lab work & pertinent test results  Airway Mallampati: II  TM Distance: >3 FB Neck ROM: Full    Dental no notable dental hx.    Pulmonary neg pulmonary ROS, former smoker,    Pulmonary exam normal breath sounds clear to auscultation       Cardiovascular hypertension, + Peripheral Vascular Disease  negative cardio ROS Normal cardiovascular exam Rhythm:Regular Rate:Normal     Neuro/Psych negative neurological ROS  negative psych ROS   GI/Hepatic negative GI ROS, Neg liver ROS,   Endo/Other  negative endocrine ROS  Renal/GU negative Renal ROS     Musculoskeletal  (+) Arthritis ,   Abdominal   Peds  Hematology negative hematology ROS (+)   Anesthesia Other Findings   Reproductive/Obstetrics negative OB ROS                             Anesthesia Physical Anesthesia Plan  ASA: II  Anesthesia Plan: General   Post-op Pain Management:    Induction: Intravenous  PONV Risk Score and Plan: 4 or greater and Ondansetron, Dexamethasone, Midazolam, Scopolamine patch - Pre-op and Propofol infusion  Airway Management Planned: Oral ETT  Additional Equipment:   Intra-op Plan:   Post-operative Plan: Extubation in OR  Informed Consent: I have reviewed the patients History and Physical, chart, labs and discussed the procedure including the risks, benefits and alternatives for the proposed anesthesia with the patient or authorized representative who has indicated his/her understanding and acceptance.   Dental advisory given  Plan Discussed with: CRNA  Anesthesia Plan Comments:         Anesthesia Quick Evaluation

## 2017-08-26 ENCOUNTER — Encounter (HOSPITAL_BASED_OUTPATIENT_CLINIC_OR_DEPARTMENT_OTHER): Payer: Self-pay | Admitting: Otolaryngology

## 2017-08-26 NOTE — Anesthesia Postprocedure Evaluation (Signed)
Anesthesia Post Note  Patient: Sara Barnett  Procedure(s) Performed: NASAL SEPTOPLASTY WITH BILATERAL TURBINATE REDUCTION (Bilateral Nose) BILATERAL ENDOSCOPIC CONCHA BULLOSA RESECTION (Bilateral Nose) BILATERAL TOTAL ETHMOIDECTOMY (Bilateral Nose) BILATERAL MAXILLARY ANTROSTOMY WITH TISSUE REMOVAL (Bilateral Nose) ENDOSCOPIC SINUS SURGERY WITH FUSION NAVIGATION (Bilateral Nose)     Patient location during evaluation: PACU Anesthesia Type: General Level of consciousness: sedated and patient cooperative Pain management: pain level controlled Vital Signs Assessment: post-procedure vital signs reviewed and stable Respiratory status: spontaneous breathing Cardiovascular status: stable Anesthetic complications: no    Last Vitals:  Vitals:   08/25/17 1230 08/25/17 1300  BP:  (!) 146/99  Pulse:  80  Resp:  16  Temp:  36.5 C  SpO2: 95% 95%    Last Pain:  Vitals:   08/25/17 1300  TempSrc:   PainSc: [place]

## 2017-08-28 ENCOUNTER — Ambulatory Visit (INDEPENDENT_AMBULATORY_CARE_PROVIDER_SITE_OTHER): Payer: PPO | Admitting: Otolaryngology

## 2017-08-28 DIAGNOSIS — J322 Chronic ethmoidal sinusitis: Secondary | ICD-10-CM

## 2017-08-28 DIAGNOSIS — J320 Chronic maxillary sinusitis: Secondary | ICD-10-CM

## 2017-09-04 DIAGNOSIS — I10 Essential (primary) hypertension: Secondary | ICD-10-CM | POA: Diagnosis not present

## 2017-09-04 DIAGNOSIS — E782 Mixed hyperlipidemia: Secondary | ICD-10-CM | POA: Diagnosis not present

## 2017-09-04 DIAGNOSIS — R7301 Impaired fasting glucose: Secondary | ICD-10-CM | POA: Diagnosis not present

## 2017-09-05 DIAGNOSIS — Z6830 Body mass index (BMI) 30.0-30.9, adult: Secondary | ICD-10-CM | POA: Diagnosis not present

## 2017-09-05 DIAGNOSIS — I10 Essential (primary) hypertension: Secondary | ICD-10-CM | POA: Diagnosis not present

## 2017-09-05 DIAGNOSIS — E785 Hyperlipidemia, unspecified: Secondary | ICD-10-CM | POA: Diagnosis not present

## 2017-09-05 DIAGNOSIS — Z23 Encounter for immunization: Secondary | ICD-10-CM | POA: Diagnosis not present

## 2017-09-05 DIAGNOSIS — Z8669 Personal history of other diseases of the nervous system and sense organs: Secondary | ICD-10-CM | POA: Diagnosis not present

## 2017-09-05 DIAGNOSIS — F411 Generalized anxiety disorder: Secondary | ICD-10-CM | POA: Diagnosis not present

## 2017-09-05 DIAGNOSIS — R7301 Impaired fasting glucose: Secondary | ICD-10-CM | POA: Diagnosis not present

## 2017-09-05 DIAGNOSIS — K219 Gastro-esophageal reflux disease without esophagitis: Secondary | ICD-10-CM | POA: Diagnosis not present

## 2017-09-05 DIAGNOSIS — Z0000 Encounter for general adult medical examination without abnormal findings: Secondary | ICD-10-CM | POA: Diagnosis not present

## 2017-09-09 DIAGNOSIS — H3320 Serous retinal detachment, unspecified eye: Secondary | ICD-10-CM | POA: Diagnosis not present

## 2017-09-09 DIAGNOSIS — R5381 Other malaise: Secondary | ICD-10-CM | POA: Diagnosis not present

## 2017-09-09 DIAGNOSIS — Z8669 Personal history of other diseases of the nervous system and sense organs: Secondary | ICD-10-CM | POA: Diagnosis not present

## 2017-09-09 DIAGNOSIS — H53002 Unspecified amblyopia, left eye: Secondary | ICD-10-CM | POA: Diagnosis not present

## 2017-09-09 DIAGNOSIS — H3521 Other non-diabetic proliferative retinopathy, right eye: Secondary | ICD-10-CM | POA: Diagnosis not present

## 2017-09-11 ENCOUNTER — Ambulatory Visit (INDEPENDENT_AMBULATORY_CARE_PROVIDER_SITE_OTHER): Payer: PPO | Admitting: Otolaryngology

## 2017-09-11 DIAGNOSIS — J320 Chronic maxillary sinusitis: Secondary | ICD-10-CM | POA: Diagnosis not present

## 2017-09-11 DIAGNOSIS — J322 Chronic ethmoidal sinusitis: Secondary | ICD-10-CM | POA: Diagnosis not present

## 2017-09-29 ENCOUNTER — Ambulatory Visit (INDEPENDENT_AMBULATORY_CARE_PROVIDER_SITE_OTHER): Payer: PPO | Admitting: Otolaryngology

## 2017-09-29 DIAGNOSIS — J322 Chronic ethmoidal sinusitis: Secondary | ICD-10-CM | POA: Diagnosis not present

## 2017-09-29 DIAGNOSIS — J320 Chronic maxillary sinusitis: Secondary | ICD-10-CM | POA: Diagnosis not present

## 2017-10-23 ENCOUNTER — Telehealth: Payer: Self-pay | Admitting: Internal Medicine

## 2017-10-23 NOTE — Telephone Encounter (Signed)
Lmom, waiting on a return call.  

## 2017-10-23 NOTE — Telephone Encounter (Signed)
Pt called, pt isn't able to pay for Linzess. She is paying over $100.00. Some sample were left up front for pt to pick up. Is there another medication that can be called in for pt. Pt had to add part B of medicare due to her disability. Pt also has a Pharmacist, community.

## 2017-10-23 NOTE — Telephone Encounter (Signed)
[place], AFTER INSURANCE LINZESS IS STILL EXPENSIVE.  IS THERE ANYTHING ELSE SHE CAN TRY

## 2017-10-24 NOTE — Telephone Encounter (Signed)
Does this need a prior authorization? She did not do well with Amitiza. Other than this, there are no other prescriptive agents that would not be expensive, so she would be back to Miralax. Can she use a copay card?

## 2017-10-24 NOTE — Telephone Encounter (Signed)
Pt just isn't able to afford the med. Pt copay card was offered to pt but she said she is now on medicare as her secondary insurance. If you have medicare, you can't use the copay cards.

## 2017-10-24 NOTE — Telephone Encounter (Signed)
Unfortunately, we are left with Miralax. Could take daily to BID as needed.

## 2017-10-29 DIAGNOSIS — Z6831 Body mass index (BMI) 31.0-31.9, adult: Secondary | ICD-10-CM | POA: Diagnosis not present

## 2017-10-29 DIAGNOSIS — J019 Acute sinusitis, unspecified: Secondary | ICD-10-CM | POA: Diagnosis not present

## 2017-10-29 NOTE — Telephone Encounter (Signed)
lmom for a return call.  

## 2017-10-29 NOTE — Telephone Encounter (Signed)
Pt returned called and Miralax was discussed bid if wanted. Pt said she may call her pharmacy to see if the medication Linzess may be more affordable since its the start of a new year. Pt knows if medication is still too expensive for her, Miralax can be taken bid daily.

## 2017-10-30 ENCOUNTER — Telehealth: Payer: Self-pay

## 2017-10-30 NOTE — Telephone Encounter (Signed)
Pt called to say her Linzess savings card had expired. Left a new Rx saving card for Linzess, pt needs to pick up and activate it.

## 2017-11-03 ENCOUNTER — Telehealth: Payer: Self-pay | Admitting: Internal Medicine

## 2017-11-03 NOTE — Telephone Encounter (Signed)
Spoke with pt, samples of Linzess 290 mcg were left up front for pt to pick up. Pts other option is Miralax per AB.

## 2017-11-03 NOTE — Telephone Encounter (Signed)
Pt called asking to speak with AM about the savings card for LInzess. She said that the pharmacy wouldn't honor it because she had medicare. Please call her back at [phone]

## 2017-11-17 ENCOUNTER — Ambulatory Visit: Payer: PPO | Admitting: Gastroenterology

## 2017-11-19 ENCOUNTER — Telehealth: Payer: Self-pay | Admitting: Neurology

## 2017-11-19 NOTE — Telephone Encounter (Signed)
I need a in lab order put in on this patient.

## 2017-11-27 DIAGNOSIS — Z23 Encounter for immunization: Secondary | ICD-10-CM | POA: Diagnosis not present

## 2017-11-27 DIAGNOSIS — R7301 Impaired fasting glucose: Secondary | ICD-10-CM | POA: Diagnosis not present

## 2017-11-27 DIAGNOSIS — I10 Essential (primary) hypertension: Secondary | ICD-10-CM | POA: Diagnosis not present

## 2017-11-27 DIAGNOSIS — K219 Gastro-esophageal reflux disease without esophagitis: Secondary | ICD-10-CM | POA: Diagnosis not present

## 2017-11-27 DIAGNOSIS — Z8669 Personal history of other diseases of the nervous system and sense organs: Secondary | ICD-10-CM | POA: Diagnosis not present

## 2017-11-27 DIAGNOSIS — J060 Acute laryngopharyngitis: Secondary | ICD-10-CM | POA: Diagnosis not present

## 2017-11-27 DIAGNOSIS — F411 Generalized anxiety disorder: Secondary | ICD-10-CM | POA: Diagnosis not present

## 2017-11-27 DIAGNOSIS — Z6830 Body mass index (BMI) 30.0-30.9, adult: Secondary | ICD-10-CM | POA: Diagnosis not present

## 2017-11-27 DIAGNOSIS — E785 Hyperlipidemia, unspecified: Secondary | ICD-10-CM | POA: Diagnosis not present

## 2017-11-27 DIAGNOSIS — Z0000 Encounter for general adult medical examination without abnormal findings: Secondary | ICD-10-CM | POA: Diagnosis not present

## 2017-11-27 DIAGNOSIS — J019 Acute sinusitis, unspecified: Secondary | ICD-10-CM | POA: Diagnosis not present

## 2017-11-27 DIAGNOSIS — Z6831 Body mass index (BMI) 31.0-31.9, adult: Secondary | ICD-10-CM | POA: Diagnosis not present

## 2017-12-01 ENCOUNTER — Ambulatory Visit (INDEPENDENT_AMBULATORY_CARE_PROVIDER_SITE_OTHER): Payer: PPO | Admitting: Neurology

## 2017-12-01 DIAGNOSIS — G4720 Circadian rhythm sleep disorder, unspecified type: Secondary | ICD-10-CM

## 2017-12-01 DIAGNOSIS — G4733 Obstructive sleep apnea (adult) (pediatric): Secondary | ICD-10-CM | POA: Diagnosis not present

## 2017-12-01 DIAGNOSIS — G4761 Periodic limb movement disorder: Secondary | ICD-10-CM

## 2017-12-01 DIAGNOSIS — R351 Nocturia: Secondary | ICD-10-CM

## 2017-12-01 DIAGNOSIS — R0683 Snoring: Secondary | ICD-10-CM

## 2017-12-01 DIAGNOSIS — E669 Obesity, unspecified: Secondary | ICD-10-CM

## 2017-12-01 DIAGNOSIS — G4719 Other hypersomnia: Secondary | ICD-10-CM

## 2017-12-04 ENCOUNTER — Telehealth: Payer: Self-pay

## 2017-12-04 NOTE — Procedures (Signed)
PATIENT'S NAME:  Sara Barnett, Sara Barnett DOB:      05/31/1966      MR#:    [id number]     DATE OF RECORDING: 12/01/2017 REFERRING M.D.:  Allyn Kenner, MD Study Performed:   Baseline Polysomnogram HISTORY: 52 year old woman with a history of hypertension, congenital cataracts, s/p surgeries as a child, lens implants in her 58s, detached retina, hyperlipidemia, impaired fasting glucose, anxiety, depression, asthma, reflux disease, Status post lower back surgery, and obesity, who reports snoring and excessive daytime somnolence. Her Epworth sleepiness score is 14 out of 24 today. The patient's weight 172 pounds with a height of 62 (inches), resulting in a BMI of 31.6 kg/m2. The patient's neck circumference measured 14.2 inches.  CURRENT MEDICATIONS: Amlodipine, Clonazepam, Duloxetine, Fluticasone, Lansoprazole, Linaclotide, Methocarbamol, Multi-Vitamin, Phentermine, Prednisolone and Tramadol   PROCEDURE:  This is a multichannel digital polysomnogram utilizing the Somnostar 11.2 system.  Electrodes and sensors were applied and monitored per AASM Specifications.   EEG, EOG, Chin and Limb EMG, were sampled at 200 Hz.  ECG, Snore and Nasal Pressure, Thermal Airflow, Respiratory Effort, CPAP Flow and Pressure, Oximetry was sampled at 50 Hz. Digital video and audio were recorded.      BASELINE STUDY  The patient took clonazepam prior to study start. Lights Out was at 21:15 and Lights On at 05:02. Total recording time (TRT) was 467.5 minutes, with a total sleep time (TST) of 284 minutes.   The patient's sleep latency was 138.5 minutes, which is markedly delayed. REM latency was 253.5 minutes, which is markedly delayed. The sleep efficiency was 60.7 %, which is reduced.     SLEEP ARCHITECTURE: WASO (Wake after sleep onset) was 54 minutes with overall mild sleep fragmentation noted and one longer period of wakefulness. There were 13.5 minutes in Stage N1, 259.5 minutes Stage N2, 0 minutes Stage N3 and 11 minutes in Stage  REM. The percentage of Stage N1 was 4.8%, Stage N2 was 91.4%, which is markedly increased, Stage N3 was absent and Stage R (REM sleep) was 3.9%, which is significantly reduced.  The arousals were noted as: 23 were spontaneous, 59 were associated with PLMs, 14 were associated with respiratory events.  Audio and video analysis did not show any abnormal or unusual movements, behaviors, phonations or vocalizations. The patient took 2 bathroom breaks. Mild intermittent snoring was noted. The EKG was in keeping with normal sinus rhythm (NSR).  RESPIRATORY ANALYSIS:  There were a total of 14 respiratory events:  0 obstructive apneas, 0 central apneas and 0 mixed apneas with a total of 0 apneas and an apnea index (AI) of 0 /hour. There were 14 hypopneas with a hypopnea index of 3. /hour. The patient also had 0 respiratory event related arousals (RERAs).      The total APNEA/HYPOPNEA INDEX (AHI) was 3./hour and the total RESPIRATORY DISTURBANCE INDEX was 3. /hour.  4 events occurred in REM sleep and 20 events in NREM. The REM AHI was 21.8 /hour, versus a non-REM AHI of 2.2. The patient spent 99.5 minutes of total sleep time in the supine position and 185 minutes in non-supine.. The supine AHI was 5.4 versus a non-supine AHI of 1.6.  OXYGEN SATURATION & C02:  The Wake baseline 02 saturation was 96%, with the lowest being 89%. Time spent below 89% saturation equaled 0 minutes.  PERIODIC LIMB MOVEMENTS: The patient had a total of 220 Periodic Limb Movements.  The Periodic Limb Movement (PLM) index was 46.5 and the PLM Arousal index was 12.5/hour.  Post-study, the patient indicated that sleep was worse than usual.   IMPRESSION:  1. Obstructive Sleep Apnea (OSA), positional, REM related 2. Periodic Limb Movement Disorder (PLMD) 3. Dysfunctions associated with sleep stages or arousal from sleep  RECOMMENDATIONS:  1. This study does not demonstrate any significant obstructive or central sleep disordered  breathing with a total AHI of less than 5/hour and O2 nadir of 89%, with the exception of supine OSA and REM related OSA. For this, treatment with CPAP is not warranted. Sleeping off the back and weight loss will likely help reduce her stage- and positional OSA. Of note, the reduced sleep efficiency and particularly the much reduced REM sleep during this study may underestimate her AHI and O2 nadir.  2. Moderate PLMs (periodic limb movements of sleep) were noted during this study with mild arousals; clinical correlation is recommended. Medication effect from the antidepressant medication should be considered.  3. This study shows sleep fragmentation and abnormal sleep stage percentages; these are nonspecific findings and per se do not signify an intrinsic sleep disorder or a cause for the patient's sleep-related symptoms. Causes include (but are not limited to) the first night effect of the sleep study, circadian rhythm disturbances, medication effect or an underlying mood disorder or medical problem.  4. The patient should be cautioned not to drive, work at heights, or operate dangerous or heavy equipment when tired or sleepy. Review and reiteration of good sleep hygiene measures should be pursued with any patient. 5. The patient will be seen in follow-up by Dr. Rexene Alberts at [facility] for discussion of the test results and further management strategies. The referring provider will be notified of the test results.  I certify that I have reviewed the entire raw data recording prior to the issuance of this report in accordance with the Standards of Accreditation of the American Academy of Sleep Medicine (AASM)   Star Age, MD, PhD Diplomat, American Board of Psychiatry and Neurology (Neurology and Sleep Medicine)

## 2017-12-04 NOTE — Progress Notes (Signed)
Patient referred by Dr. Nevada Crane, seen by me on 07/10/17, diagnostic PSG on 12/01/17.   Please call and notify the patient that the recent sleep study did not show any significant obstructive sleep apnea with a total AHI of less than 5/hour and O2 nadir of 89%, with the exception of supine OSA and REM related OSA. For this, treatment with CPAP is not warranted. Sleeping off the back and weight loss will likely help reduce her stage- and positional OSA. Of note, the reduced sleep efficiency and particularly the much reduced REM sleep during this study may underestimate her AHI and O2 nadir. Leg movements were noted and may contribute to sleep disruption, leg twitching can be associated with taking an antidepressant. Please inform patient that I would like to go over the details of the study during a follow up appointment. Arrange a followup appointment. Also, route or fax report to PCP and referring MD, if other than PCP.  Once you have spoken to patient, you can close this encounter.   Thanks,  Star Age, MD, PhD [facility])

## 2017-12-04 NOTE — Telephone Encounter (Addendum)
I called pt. I advised her that her sleep study did not show any significant osa with the exception of supine OSA and REM related OSA. CPAP treatment is not warranted. I recommended that pt avoid supine sleep and that she should pursue weight loss ad this will likely help reduce her osa. Since she had reduced sleep efficiency and reduced REM sleep during this study, the AHI and O2 nadir may be underestimated. Pt's leg movements may have disrupted her sleep. Dr. Rexene Alberts recommends that pt follow up to discuss thehy results of this study. Pt is agreeable to an appt on 12/15/17 at 2:00pm. Pt verbalized understanding of results. Pt had no questions at this time but was encouraged to call back if questions arise.  Pt is asking how much her bill for her sleep study will be and is asking for either our sleep lab or billing to call her.

## 2017-12-04 NOTE — Telephone Encounter (Signed)
-----   Message from Star Age, MD sent at 12/04/2017  7:56 AM EST ----- Patient referred by Dr. Nevada Crane, seen by me on 07/10/17, diagnostic PSG on 12/01/17.   Please call and notify the patient that the recent sleep study did not show any significant obstructive sleep apnea with a total AHI of less than 5/hour and O2 nadir of 89%, with the exception of supine OSA and REM related OSA. For this, treatment with CPAP is not warranted. Sleeping off the back and weight loss will likely help reduce her stage- and positional OSA. Of note, the reduced sleep efficiency and particularly the much reduced REM sleep during this study may underestimate her AHI and O2 nadir. Leg movements were noted and may contribute to sleep disruption, leg twitching can be associated with taking an antidepressant. Please inform patient that I would like to go over the details of the study during a follow up appointment. Arrange a followup appointment. Also, route or fax report to PCP and referring MD, if other than PCP.  Once you have spoken to patient, you can close this encounter.   Thanks,  Star Age, MD, PhD [facility])

## 2017-12-10 ENCOUNTER — Telehealth: Payer: Self-pay

## 2017-12-10 NOTE — Telephone Encounter (Signed)
Can we find out if this is a tier issue? We could try doing a 90 day supply, which may still be 160$ total but that's less per month. Otherwise, we are left with Miralax.

## 2017-12-10 NOTE — Telephone Encounter (Signed)
Spoke with pt and let her know I will check into a tier coverage. Pt notified was notified.

## 2017-12-10 NOTE — Telephone Encounter (Signed)
Pt called office. She is having trouble getting Linzess 264mcg with her insurance. It will cost her $160 for 1 month supply. She has a medicare plan so she's unable to use copay card. She called [facility], someone told her there is a generic for Linzess.   Routing to AB for advice.

## 2017-12-10 NOTE — Telephone Encounter (Signed)
Lmom, waiting on a return call.  

## 2017-12-11 NOTE — Telephone Encounter (Signed)
Pt called to give her pharmacy benefit number [phone],  [id number], [address] [place], [place], [place] [id number].   Spoke with pts insurance department to ask out filling a tier form. Rep explained that the last time pt tried to get the medication filled, she was in the donut hole, when the rep ran the script for Linzess on the phone, the cost was $45.00. Rep is sending a tier form to try to lower cost. Pt was made aware.

## 2017-12-15 ENCOUNTER — Encounter: Payer: Self-pay | Admitting: Neurology

## 2017-12-15 ENCOUNTER — Ambulatory Visit (INDEPENDENT_AMBULATORY_CARE_PROVIDER_SITE_OTHER): Payer: PPO | Admitting: Neurology

## 2017-12-15 VITALS — BP 126/85 | HR 85 | Ht 63.0 in | Wt 171.0 lb

## 2017-12-15 DIAGNOSIS — G2581 Restless legs syndrome: Secondary | ICD-10-CM | POA: Diagnosis not present

## 2017-12-15 DIAGNOSIS — G4761 Periodic limb movement disorder: Secondary | ICD-10-CM

## 2017-12-15 DIAGNOSIS — G479 Sleep disorder, unspecified: Secondary | ICD-10-CM | POA: Diagnosis not present

## 2017-12-15 MED ORDER — PRAMIPEXOLE DIHYDROCHLORIDE 0.125 MG PO TABS: ORAL_TABLET | ORAL | 5 refills | Status: DC

## 2017-12-15 NOTE — Progress Notes (Signed)
Subjective:    Patient ID: Sara Barnett is a 52 y.o. female.  HPI     Interim history:   Sara Barnett is a 52 year old right-handed woman with an underlying medical history of hypertension, congenital cataracts, s/p surgeries as a child, lens implants in her 65s, detached retina, hyperlipidemia, impaired fasting glucose, anxiety, depression, asthma, reflux disease, Status post lower back surgery, and obesity, who presents for follow-up consultation of her sleep disturbance, after recent sleep study testing. The patient is unaccompanied today. I first met her on 07/10/2017 at the request of her primary care physician, at which time she reported snoring and daytime somnolence. I asked her to return for a sleep study. She had a baseline sleep study on 12/01/2017. Sleep efficiency was reduced at 60.7% with delayed at 138.5 minutes and REM latency was also markedly delayed at 253.5 minutes. She had an increased wake after sleep onset with mild sleep fragmentation noted and one longer period of wakefulness. She had absent slow-wave sleep and markedly increased percentage of stage II sleep, significantly low percentage of REM sleep at 3.9%. Total AHI was 3 per hour, REM AHI was 21.8 per hour, supine AHI borderline at 5.4 per hour, average oxygen saturation 96%, nadir was 89%, she had intermittent mild snoring. PLM index was 46.5 per hour and PLM arousal index was 12.5 per hour.  Today, 12/15/2017: She reports no new issues, but sleep is difficult at times, feels sluggish in her thinking at times. She reports having coded during one of her eye surgeries. She used to be sharper she feels. She is worried about hereditary condition. She wonders if she is at risk for spinocerebellar ataxia. She is advised that the risk depends on the type of ataxia. She does endorse some restless leg symptoms. Her balance is not as good. She endorses waking up with a startle at times. She does not sleep very well at night and  does not wake up rested, she is tired during the day.  Previously:  07/10/2017: (She) reports snoring and excessive daytime somnolence. I reviewed your office note from 07/01/2017, which you kindly included. Her Epworth sleepiness score is 14 out of 24 today, fatigue score is 59 out of 63. She lives with her 2 daughters. She is on disability, quit smoking in 2009, drinks alcohol occasionally, caffeine in the form of energy drinks about 1 per day, coffee 2-3 cups, tea occasional.  She denies morning headaches, nocturia is about 1/night. She goes to bed between 10-11 PM and WT is around 7:30 PM. She does not watch TV in bed. She reports sleep talking and denies telltale Sx of RLS and no PLMs are reported.  She takes clonazepam, sometimes at night for sleep. She takes Robaxin prn.    The patient's allergies, current medications, family history, past medical history, past social history, past surgical history and problem list were reviewed and updated as appropriate.    Her Past Medical History Is Significant For: Past Medical History:  Diagnosis Date  . Abnormally small mouth   . Anxiety   . Arthritis    knees  . Chronic ethmoidal sinusitis 07/2017  . Chronic maxillary sinusitis 07/2017  . Complication of anesthesia    anaphylaxis after Methylprednisolone injection - cardiac arrest/PEA  . 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 Constipation   . Depression   . Deviated nasal septum 07/2017  . GERD (gastroesophageal reflux disease)   . History of cardiac murmur    states no known  problems, no cardiologist  . Hypertension    states under control with meds., has been on med. x 17 years  . Legally blind   . Nasal turbinate hypertrophy 07/2017  . Rash of back 08/18/2017    Her Past Surgical History Is Significant For: Past Surgical History:  Procedure Laterality Date  . ABLATION ON ENDOMETRIOSIS    . BUNIONECTOMY Right   . CATARACT EXTRACTION, BILATERAL     age 86 year  . CESAREAN  SECTION     x 2  . ENDOSCOPIC CONCHA BULLOSA RESECTION Bilateral 08/25/2017   Procedure: BILATERAL ENDOSCOPIC CONCHA BULLOSA RESECTION;  Surgeon: Leta Baptist, MD;  Location: [place];  Service: ENT;  Laterality: Bilateral;  . ETHMOIDECTOMY Bilateral 08/25/2017   Procedure: BILATERAL TOTAL ETHMOIDECTOMY;  Surgeon: Leta Baptist, MD;  Location: [place];  Service: ENT;  Laterality: Bilateral;  . EYE SURGERY    . INTRAOCULAR LENS INSERTION Bilateral    age 23s  . LUMBAR FUSION  07/05/2004  . LUMBAR LAMINECTOMY  07/05/2004   L5-S1  . MAXILLARY ANTROSTOMY Bilateral 08/25/2017   Procedure: BILATERAL MAXILLARY ANTROSTOMY WITH TISSUE REMOVAL;  Surgeon: Leta Baptist, MD;  Location: [place];  Service: ENT;  Laterality: Bilateral;  . MEMBRANE PEEL Right 11/06/2011; 08/06/2013  . NASAL SEPTOPLASTY W/ TURBINOPLASTY Bilateral 08/25/2017   Procedure: NASAL SEPTOPLASTY WITH BILATERAL TURBINATE REDUCTION;  Surgeon: Leta Baptist, MD;  Location: [place];  Service: ENT;  Laterality: Bilateral;  . PARS PLANA VITRECTOMY Right 03/22/2011; 08/21/2011; 12/20/2011; 05/13/2012; 11/20/2012; 05/12/2013; 08/06/2013; 05/17/2016  . PARS PLANA VITRECTOMY W/ SCLERAL BUCKLE Left 04/21/2015  . PLANTAR FASCIA RELEASE Right   . SINUS ENDO W/FUSION Bilateral 08/25/2017   Procedure: ENDOSCOPIC SINUS SURGERY WITH FUSION NAVIGATION;  Surgeon: Leta Baptist, MD;  Location: [place];  Service: ENT;  Laterality: Bilateral;  . TUBAL LIGATION      Her Family History Is Significant For: Family History  Problem Relation Age of Onset  . Depression Mother   . Cancer Father        prostate  . Hypertension Father   . Hearing loss Father   . Vision loss Daughter        cataracts  . Stroke Maternal Grandmother   . Other Paternal Grandmother        lung issues  . Vision loss Daughter        cataracts    Her Social History Is Significant For: Social History    Socioeconomic History  . Marital status: Divorced    Spouse name: None  . Number of children: None  . Years of education: None  . Highest education level: None  Social Needs  . Financial resource strain: None  . Food insecurity - worry: None  . Food insecurity - inability: None  . Transportation needs - medical: None  . Transportation needs - non-medical: None  Occupational History  . None  Tobacco Use  . Smoking status: Former Smoker    Last attempt to quit: 05/24/2009    Years since quitting: 8.5  . Smokeless tobacco: Never Used  Substance and Sexual Activity  . Alcohol use: Yes    Comment: occasionally  . Drug use: No  . Sexual activity: Not Currently    Birth control/protection: None, Surgical    Comment: tubal and ablation  Other Topics Concern  . None  Social History Narrative  . None    Her Allergies Are:  Allergies  Allergen Reactions  .  Methylprednisolone Anaphylaxis    CARDIAC ARREST/PEA   . Augmentin [Amoxicillin-Pot Clavulanate] Other (See Comments)    "FEELING HOT"; EXTREME SLEEPINESS  . Doxycycline Itching    THROAT SWELLING  . Hydrocodone-Acetaminophen Itching and Other (See Comments)    THROAT SWELLING  . Statins Other (See Comments)    JOINT PAIN  :   Her Current Medications Are:  Outpatient Encounter Medications as of 12/15/2017  Medication Sig  . amLODipine-benazepril (LOTREL) 10-40 MG per capsule Take 1 capsule by mouth daily.  . cholecalciferol (VITAMIN D) 1000 units tablet Take 2,000 Units by mouth daily.  . clonazePAM (KLONOPIN) 0.5 MG tablet Take 0.5 mg by mouth 2 (two) times daily as needed for anxiety.  . Cyanocobalamin (VITAMIN B 12 PO) Take by mouth.  . dextromethorphan-guaiFENesin (MUCINEX DM) 30-600 MG 12hr tablet Take 1 tablet by mouth 2 (two) times daily.  . DULoxetine (CYMBALTA) 60 MG capsule Take 60 mg by mouth daily.  . lansoprazole (PREVACID) 30 MG capsule Take 30 mg by mouth daily at 12 noon.  . linaclotide (LINZESS) 290  MCG CAPS capsule Take 1 capsule (290 mcg total) by mouth daily before breakfast.  . methocarbamol (ROBAXIN) 500 MG tablet Take 500 mg by mouth 3 (three) times daily as needed.  . Multiple Vitamin (MULTIVITAMIN) tablet Take 1 tablet by mouth daily.  . Multiple Vitamins-Minerals (HAIR SKIN NAILS PO) Take by mouth.  Marland Kitchen OVER THE COUNTER MEDICATION Take by mouth daily. HAIR LA VIE  . OVER THE COUNTER MEDICATION Take by mouth daily. HAIR GROW  . oxyCODONE-acetaminophen (ROXICET) 5-325 MG tablet Take 1 tablet by mouth every 4 (four) hours as needed for severe pain.  . polyethylene glycol (MIRALAX / GLYCOLAX) packet Take 17 g by mouth daily.  . vitamin A 25000 UNIT capsule Take 25,000 Units by mouth daily.  . [DISCONTINUED] phentermine 37.5 MG capsule Take 37.5 mg by mouth daily.   No facility-administered encounter medications on file as of 12/15/2017.   :  Review of Systems:  Out of a complete 14 point review of systems, all are reviewed and negative with the exception of these symptoms as listed below: Review of Systems  Neurological:       Patient here to discuss sleep study results.     Objective:  Neurological Exam  Physical Exam Physical Examination:   Vitals:   12/15/17 1402  BP: 126/85  Pulse: 85   General Examination: The patient is a very pleasant 52 y.o. female in no acute distress. She appears well-developed and well-nourished and well groomed.   HEENT: Normocephalic, atraumatic, irregular and unequal pupils, not clearly reactive. Legally blind b/l. Extraocular tracking is good. Hearing is grossly intact. Face is symmetric with normal facial animation and normal facial sensation. Speech is clear with no dysarthria noted. There is no hypophonia. There is no lip, neck/head, jaw or voice tremor. Neck is supple with full range of passive and active motion. Oropharynx exam reveals: moderate mouth dryness, adequate dental hygiene and moderate airway crowding. Tongue protrudes centrally  and palate elevates symmetrically.   Chest: Clear to auscultation without wheezing, rhonchi or crackles noted.  Heart: S1+S2+0, regular and normal without murmurs, rubs or gallops noted.   Abdomen: Soft, non-tender and non-distended with normal bowel sounds appreciated on auscultation.  Extremities: There is no pitting edema in the distal lower extremities bilaterally.   Skin: Warm and dry without trophic changes noted.  Musculoskeletal: exam reveals no obvious joint deformities, tenderness or joint swelling or erythema.  Neurologically:  Mental status: The patient is awake, alert and oriented in all 4 spheres. Her immediate and remote memory, attention, language skills and fund of knowledge are appropriate. There is no evidence of aphasia, agnosia, apraxia or anomia. Speech is clear with normal prosody and enunciation. Thought process is linear. Mood is normal and affect is normal.  Cranial nerves II - XII are as described above under HEENT exam. In addition: shoulder shrug is normal with equal shoulder height noted. Motor exam: Normal bulk, strength and tone is noted. There is no drift, tremor or rebound. Reflexes are + throughout. Fine motor skills and coordination: grossly intact.  Cerebellar testing: No dysmetria or intention tremor on finger to nose testing. Heel to shin is unremarkable bilaterally. There is no truncal or gait ataxia.  Sensory exam: intact to light touch in the upper and lower extremities.  Gait, station and balance: She stands easily. No veering to one side is noted. No leaning to one side is noted. Posture is age-appropriate and stance is narrow based. Gait shows normal stride length and normal pace. No problems turning are noted.    Assessment and Plan:  In summary, TREACY HOLCOMB is a very pleasant 52 year old female with an underlying medical history of hypertension, congenital cataracts, s/p surgeries as a child, lens implants in her 5s, detached  retina, hyperlipidemia, impaired fasting glucose, anxiety, depression, asthma, reflux disease, Status post lower back surgery, and obesity, who presents for follow-up consultation of her sleep disorder, after recent sleep study testing. She reports issues with sleep onset and sleep maintenance at night, she does not always have telltale symptoms of restless legs but does feel restless at night and does not wake up rested. Her sleep study from 12/01/2017 showed no significant obstructive sleep apnea, she had significant PLMS with and without arousals. Of note, she is on Cymbalta which can cause PLMS and also exacerbate restless leg symptoms. She is not sure if she has a history of iron deficiency. We will proceed with blood work today to rule out iron deficiency or frank anemia. I suggested she start on Mirapex generic with slow titration to see if she feels more rested and less restless at night. She is advised to look out for better sleep quality, but his sleep consolidation, less daytime somnolence as a result of treating her leg movements at night. I suggested a 3 month follow-up with one of our nurse practitioners, we will be in touch in the interim as to her blood test results. I answered all her questions today and she was in agreement.  I spent 25 minutes in total face-to-face time with the patient, more than 50% of which was spent in counseling and coordination of care, reviewing test results, reviewing medication and discussing or reviewing the diagnosis of PLMD, its prognosis and treatment options. Pertinent laboratory and imaging test results that were available during this visit with the patient were reviewed by me and considered in my medical decision making (see chart for details).

## 2017-12-15 NOTE — Patient Instructions (Signed)
Your sleep study did not show any significant obstructive sleep apnea. That's good news. Nevertheless, you did not sleep all that well either.  You had moderate leg twitching in sleep, which we call PLMs (period limb movements of sleep) which did disturb your sleep some.  You may have restless legs symptoms. Keep in mind restless legs syndrome (RLS) is associated with anemia and iron deficiency.   Please remember to try to maintain good sleep hygiene, which means: Keep a regular sleep and wake schedule, try not to exercise or have a meal within 2 hours of your bedtime, try to keep your bedroom conducive for sleep, that is, cool and dark, without light distractors such as an illuminated alarm clock, and refrain from watching TV right before sleep or in the middle of the night and do not keep the TV or radio on during the night. Also, try not to use or play on electronic devices at bedtime, such as your cell phone, tablet PC or laptop. If you like to read at bedtime on an electronic device, try to dim the background light as much as possible. Do not eat in the middle of the night.   Restless leg symptoms can be induced are aggravated by certain conditions including thyroid dysfunction and anemia or iron deficiency. Certain medications can induce leg twitching at night, which we call periodic leg movements and disrupt sleep and also exacerbate restless leg symptoms during wakefulness. These medications often include antidepressants.  We will do some blood work today and call you with the test results. I may ask you to start an over-the-counter iron supplement if your storage iron values are low. We will start you on Mirapex (generic name: pramipexole) 0.125 mg: Take 1 pill each night for 1 week, the 2 pills each night for 1 week, then 3 pills each night thereafter. Common side effects reported are: Sedation, sleepiness, nausea, vomiting, and rare side effects are confusion, hallucinations, swelling in legs, and  abnormal behaviors, including impulse control problems, which can manifest as excessive eating, obsessions with food or gambling, or hypersexuality.

## 2017-12-16 ENCOUNTER — Telehealth: Payer: Self-pay

## 2017-12-16 LAB — CBC WITH DIFFERENTIAL/PLATELET
BASOS ABS: 0 10*3/uL (ref 0.0–0.2)
Basos: 1 %
EOS (ABSOLUTE): 0.1 10*3/uL (ref 0.0–0.4)
EOS: 2 %
HEMOGLOBIN: 14 g/dL (ref 11.1–15.9)
Hematocrit: 41.9 % (ref 34.0–46.6)
Immature Grans (Abs): 0 10*3/uL (ref 0.0–0.1)
Immature Granulocytes: 0 %
LYMPHS ABS: 1.5 10*3/uL (ref 0.7–3.1)
Lymphs: 30 %
MCH: 29.3 pg (ref 26.6–33.0)
MCHC: 33.4 g/dL (ref 31.5–35.7)
MCV: 88 fL (ref 79–97)
MONOCYTES: 4 %
Monocytes Absolute: 0.2 10*3/uL (ref 0.1–0.9)
NEUTROS PCT: 63 %
Neutrophils Absolute: 3.1 10*3/uL (ref 1.4–7.0)
Platelets: 363 10*3/uL (ref 150–379)
RBC: 4.78 x10E6/uL (ref 3.77–5.28)
RDW: 14.2 % (ref 12.3–15.4)
WBC: 5 10*3/uL (ref 3.4–10.8)

## 2017-12-16 LAB — IRON AND TIBC
Iron Saturation: 14 % — ABNORMAL LOW (ref 15–55)
Iron: 48 ug/dL (ref 27–159)
Total Iron Binding Capacity: 338 ug/dL (ref 250–450)
UIBC: 290 ug/dL (ref 131–425)

## 2017-12-16 LAB — FERRITIN: FERRITIN: 17 ng/mL (ref 15–150)

## 2017-12-16 NOTE — Telephone Encounter (Signed)
-----   Message from Star Age, MD sent at 12/16/2017  8:15 AM EST ----- Please call patient and advise her that her storage iron which is called ferritin is indeed a little low and can be a contributor to restless leg symptoms and/or leg kicking in sleep. Therefore, I would like for the patient to start an over-the-counter iron supplement. She can go with ferrous sulfate or ferrous gluconate. Not everybody tolerates iron very well and it can cause constipation or diarrhea. It is better to take it with a little bit of orange juice because the vitamin C helps with absorption. She can follow the directions on the medication bottle or box. Even though She is not frankly anemic, low storage iron or ferritin can exacerbate RLS symptoms or leg movements in sleep. We will still proceed with the Mirapex too. Will check Iron again in about 3 months.   Star Age, MD, PhD [facility])

## 2017-12-16 NOTE — Progress Notes (Signed)
Please call patient and advise her that her storage iron which is called ferritin is indeed a little low and can be a contributor to restless leg symptoms and/or leg kicking in sleep. Therefore, I would like for the patient to start an over-the-counter iron supplement. She can go with ferrous sulfate or ferrous gluconate. Not everybody tolerates iron very well and it can cause constipation or diarrhea. It is better to take it with a little bit of orange juice because the vitamin C helps with absorption. She can follow the directions on the medication bottle or box. Even though She is not frankly anemic, low storage iron or ferritin can exacerbate RLS symptoms or leg movements in sleep. We will still proceed with the Mirapex too. Will check Iron again in about 3 months.   Sara Age, MD, PhD [facility])

## 2017-12-16 NOTE — Telephone Encounter (Signed)
I called pt and explained her lab work results. Pt is agreeable to starting an iron supplement and pt will follow up in 3 months as scheduled and knows that her iron will be rechecked at that time. Pt verbalized understanding of results. Pt had no questions at this time but was encouraged to call back if questions arise.

## 2017-12-26 DIAGNOSIS — M542 Cervicalgia: Secondary | ICD-10-CM | POA: Diagnosis not present

## 2017-12-26 DIAGNOSIS — J0100 Acute maxillary sinusitis, unspecified: Secondary | ICD-10-CM | POA: Diagnosis not present

## 2018-01-02 ENCOUNTER — Telehealth: Payer: Self-pay

## 2018-01-02 NOTE — Telephone Encounter (Signed)
Pt walked in office for samples of Linzess 290 mcg. Samples given to pt.

## 2018-01-09 DIAGNOSIS — Z8669 Personal history of other diseases of the nervous system and sense organs: Secondary | ICD-10-CM | POA: Diagnosis not present

## 2018-01-09 DIAGNOSIS — I10 Essential (primary) hypertension: Secondary | ICD-10-CM | POA: Diagnosis not present

## 2018-01-09 DIAGNOSIS — F411 Generalized anxiety disorder: Secondary | ICD-10-CM | POA: Diagnosis not present

## 2018-01-09 DIAGNOSIS — E785 Hyperlipidemia, unspecified: Secondary | ICD-10-CM | POA: Diagnosis not present

## 2018-01-09 DIAGNOSIS — M542 Cervicalgia: Secondary | ICD-10-CM | POA: Diagnosis not present

## 2018-01-09 DIAGNOSIS — J0100 Acute maxillary sinusitis, unspecified: Secondary | ICD-10-CM | POA: Diagnosis not present

## 2018-01-09 DIAGNOSIS — Z6832 Body mass index (BMI) 32.0-32.9, adult: Secondary | ICD-10-CM | POA: Diagnosis not present

## 2018-01-09 DIAGNOSIS — J019 Acute sinusitis, unspecified: Secondary | ICD-10-CM | POA: Diagnosis not present

## 2018-01-09 DIAGNOSIS — Z6831 Body mass index (BMI) 31.0-31.9, adult: Secondary | ICD-10-CM | POA: Diagnosis not present

## 2018-01-09 DIAGNOSIS — Z713 Dietary counseling and surveillance: Secondary | ICD-10-CM | POA: Diagnosis not present

## 2018-01-09 DIAGNOSIS — R7301 Impaired fasting glucose: Secondary | ICD-10-CM | POA: Diagnosis not present

## 2018-01-09 DIAGNOSIS — K219 Gastro-esophageal reflux disease without esophagitis: Secondary | ICD-10-CM | POA: Diagnosis not present

## 2018-01-12 ENCOUNTER — Ambulatory Visit (INDEPENDENT_AMBULATORY_CARE_PROVIDER_SITE_OTHER): Payer: PPO | Admitting: Otolaryngology

## 2018-01-12 DIAGNOSIS — J310 Chronic rhinitis: Secondary | ICD-10-CM | POA: Diagnosis not present

## 2018-01-12 NOTE — Telephone Encounter (Addendum)
Pt picked  Linzess samples up from office 2 weeks ago. Pt understand that the $45.00 cost is less than $160.00 pt was going to have to pay. Pt was ok with paying $45.00 since tier exception was denied.

## 2018-01-15 ENCOUNTER — Ambulatory Visit: Payer: PPO | Admitting: Gastroenterology

## 2018-01-15 ENCOUNTER — Encounter: Payer: Self-pay | Admitting: Gastroenterology

## 2018-01-15 VITALS — BP 137/88 | HR 95 | Temp 96.6°F | Ht 62.5 in | Wt 177.8 lb

## 2018-01-15 DIAGNOSIS — K5900 Constipation, unspecified: Secondary | ICD-10-CM | POA: Diagnosis not present

## 2018-01-15 NOTE — Patient Instructions (Signed)
Continue the Linzess each morning.   I would add the supplemental fiber as you are doing. You can take Miralax as needed in addition to [place].  We will see you in 4-5 months to arrange your initial screening colonoscopy.  Have a great birthday!!  It was a pleasure to see you today. I strive to create trusting relationships with patients to provide genuine, compassionate, and quality care. I value your feedback. If you receive a survey regarding your visit,  I greatly appreciate you taking time to fill this out.   Annitta Needs, PhD, ANP-BC [facility]

## 2018-01-15 NOTE — Progress Notes (Signed)
Primary Care Physician:  Celene Squibb, MD Primary GI: Dr. Gala Romney   Chief Complaint  Patient presents with  . Constipation    out of Linzess samples    HPI:   Sara Barnett is a 52 y.o. female presenting today with a history of chronic constipation. Last seen in Aug 2018. Previously trialed Amitiza but states this made her abdomen feel hard. Linzess 290 mcg has worked best for her.   If eats lots of cheese, notices worsening. Linzess 290 mcg doing well for her. Difficulties with insurance, so she gets samples as needed. She is finding that working out helps with constipation as well. She has had limited exercise the last few weeks due to a catch in her neck. Takes Metamucil, which has helped. Overall, she is doing well. Turns 52 in May. Has lots of family events upcoming. Would like to enjoy the summer, then pursue colonoscopy.   Past Medical History:  Diagnosis Date  . Abnormally small mouth   . Anxiety   . Arthritis    knees  . Chronic ethmoidal sinusitis 07/2017  . Chronic maxillary sinusitis 07/2017  . Complication of anesthesia    anaphylaxis after Methylprednisolone injection - cardiac arrest/PEA  . 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 Constipation   . Depression   . Deviated nasal septum 07/2017  . GERD (gastroesophageal reflux disease)   . History of cardiac murmur    states no known problems, no cardiologist  . Hypertension    states under control with meds., has been on med. x 17 years  . Legally blind   . Nasal turbinate hypertrophy 07/2017  . Rash of back 08/18/2017    Past Surgical History:  Procedure Laterality Date  . ABLATION ON ENDOMETRIOSIS    . BUNIONECTOMY Right   . CATARACT EXTRACTION, BILATERAL     age 4 year  . CESAREAN SECTION     x 2  . ENDOSCOPIC CONCHA BULLOSA RESECTION Bilateral 08/25/2017   Procedure: BILATERAL ENDOSCOPIC CONCHA BULLOSA RESECTION;  Surgeon: Leta Baptist, MD;  Location: [place];  Service: ENT;   Laterality: Bilateral;  . ETHMOIDECTOMY Bilateral 08/25/2017   Procedure: BILATERAL TOTAL ETHMOIDECTOMY;  Surgeon: Leta Baptist, MD;  Location: [facility];  Service: ENT;  Laterality: Bilateral;  . EYE SURGERY    . INTRAOCULAR LENS INSERTION Bilateral    age 19s  . LUMBAR FUSION  07/05/2004  . LUMBAR LAMINECTOMY  07/05/2004   L5-S1  . MAXILLARY ANTROSTOMY Bilateral 08/25/2017   Procedure: BILATERAL MAXILLARY ANTROSTOMY WITH TISSUE REMOVAL;  Surgeon: Leta Baptist, MD;  Location: [facility];  Service: ENT;  Laterality: Bilateral;  . MEMBRANE PEEL Right 11/06/2011; 08/06/2013  . NASAL SEPTOPLASTY W/ TURBINOPLASTY Bilateral 08/25/2017   Procedure: NASAL SEPTOPLASTY WITH BILATERAL TURBINATE REDUCTION;  Surgeon: Leta Baptist, MD;  Location: [facility];  Service: ENT;  Laterality: Bilateral;  . PARS PLANA VITRECTOMY Right 03/22/2011; 08/21/2011; 12/20/2011; 05/13/2012; 11/20/2012; 05/12/2013; 08/06/2013; 05/17/2016  . PARS PLANA VITRECTOMY W/ SCLERAL BUCKLE Left 04/21/2015  . PLANTAR FASCIA RELEASE Right   . SINUS ENDO W/FUSION Bilateral 08/25/2017   Procedure: ENDOSCOPIC SINUS SURGERY WITH FUSION NAVIGATION;  Surgeon: Leta Baptist, MD;  Location: [facility];  Service: ENT;  Laterality: Bilateral;  . TUBAL LIGATION      Current Outpatient Medications  Medication Sig Dispense Refill  . amLODipine-benazepril (LOTREL) 10-40 MG per capsule Take 1 capsule by mouth daily.    Marland Kitchen  cholecalciferol (VITAMIN D) 1000 units tablet Take 2,000 Units by mouth daily. Doesn't take daily    . clonazePAM (KLONOPIN) 0.5 MG tablet Take 0.5 mg by mouth 2 (two) times daily as needed for anxiety.    Marland Kitchen dextromethorphan-guaiFENesin (MUCINEX DM) 30-600 MG 12hr tablet Take 1 tablet by mouth as needed.     . DULoxetine (CYMBALTA) 60 MG capsule Take 60 mg by mouth daily.    . IRON PO Take by mouth daily.    . lansoprazole (PREVACID) 30 MG capsule Take 30 mg by mouth daily at 12 noon.    .  linaclotide (LINZESS) 290 MCG CAPS capsule Take 1 capsule (290 mcg total) by mouth daily before breakfast. 90 capsule 3  . methocarbamol (ROBAXIN) 500 MG tablet Take 500 mg by mouth 3 (three) times daily as needed.    . Multiple Vitamin (MULTIVITAMIN) tablet Take 1 tablet by mouth daily.    . Multiple Vitamins-Minerals (HAIR SKIN NAILS PO) Take by mouth.    Marland Kitchen OVER THE COUNTER MEDICATION Take by mouth daily. HAIR LA VIE    . OVER THE COUNTER MEDICATION Take by mouth daily. HAIR GROW    . polyethylene glycol (MIRALAX / GLYCOLAX) packet Take 17 g by mouth daily as needed.     . pramipexole (MIRAPEX) 0.125 MG tablet Take 0.125 mg by mouth at bedtime. 3 tablets each evening    . Psyllium (METAMUCIL FIBER PO) Take by mouth as needed.    . vitamin A 25000 UNIT capsule Take 25,000 Units by mouth as needed.      No current facility-administered medications for this visit.     Allergies as of 01/15/2018 - Review Complete 01/15/2018  Allergen Reaction Noted  . Methylprednisolone Anaphylaxis 12/13/2015  . Augmentin [amoxicillin-pot clavulanate] Other (See Comments) 08/18/2017  . Doxycycline Itching 12/18/2011  . Hydrocodone-acetaminophen Itching and Other (See Comments) 12/13/2015  . Statins Other (See Comments) 12/18/2011    Family History  Problem Relation Age of Onset  . Depression Mother   . Cancer Father        prostate  . Hypertension Father   . Hearing loss Father   . Vision loss Daughter        cataracts  . Stroke Maternal Grandmother   . Other Paternal Grandmother        lung issues  . Vision loss Daughter        cataracts    Social History   Socioeconomic History  . Marital status: Divorced    Spouse name: Not on file  . Number of children: Not on file  . Years of education: Not on file  . Highest education level: Not on file  Occupational History  . Not on file  Social Needs  . Financial resource strain: Not on file  . Food insecurity:    Worry: Not on file     Inability: Not on file  . Transportation needs:    Medical: Not on file    Non-medical: Not on file  Tobacco Use  . Smoking status: Former Smoker    Last attempt to quit: 05/24/2009    Years since quitting: 8.6  . Smokeless tobacco: Never Used  Substance and Sexual Activity  . Alcohol use: Yes    Comment: occasionally  . Drug use: No  . Sexual activity: Not Currently    Birth control/protection: None, Surgical    Comment: tubal and ablation  Lifestyle  . Physical activity:    Days per week: Not on  file    Minutes per session: Not on file  . Stress: Not on file  Relationships  . Social connections:    Talks on phone: Not on file    Gets together: Not on file    Attends religious service: Not on file    Active member of club or organization: Not on file    Attends meetings of clubs or organizations: Not on file    Relationship status: Not on file  Other Topics Concern  . Not on file  Social History Narrative  . Not on file    Review of Systems: Gen: Denies fever, chills, anorexia. Denies fatigue, weakness, weight loss.  CV: Denies chest pain, palpitations, syncope, peripheral edema, and claudication. Resp: Denies dyspnea at rest, cough, wheezing, coughing up blood, and pleurisy. GI: see HPI  Derm: Denies rash, itching, dry skin Psych: Denies depression, anxiety, memory loss, confusion. No homicidal or suicidal ideation.  Heme: Denies bruising, bleeding, and enlarged lymph nodes.  Physical Exam: BP 137/88   Pulse 95   Temp (!) 96.6 F (35.9 C) (Oral)   Ht 5' 2.5" (1.588 m)   Wt 177 lb 12.8 oz (80.6 kg)   LMP 01/12/2018   BMI 32.00 kg/m  General:   Alert and oriented. No distress noted. Pleasant and cooperative.  Head:  Normocephalic and atraumatic. Eyes:  Conjuctiva clear without scleral icterus. Mouth:  Oral mucosa pink and moist.  Abdomen:  +BS, soft, non-tender and non-distended. No rebound or guarding. No HSM or masses noted. Msk:  Symmetrical without gross  deformities. Normal posture. Extremities:  Without edema. Neurologic:  Alert and  oriented x4 Psych:  Alert and cooperative. Normal mood and affect.

## 2018-01-15 NOTE — Assessment & Plan Note (Signed)
Doing well with Linzes 290 mcg. Add supplemental fiber routinely instead of just prn, as this helps her. No alarm symptoms. First ever colonoscopy due this year, as she is turning 78. Will have her return in 4-5 months to have this arranged, as she will need Propofol due to polypharmacy.

## 2018-01-23 ENCOUNTER — Telehealth: Payer: Self-pay

## 2018-01-23 MED ORDER — LINACLOTIDE 290 MCG PO CAPS: 290.0000 ug | ORAL_CAPSULE | Freq: Every day | ORAL | 3 refills | Status: DC

## 2018-01-23 NOTE — Telephone Encounter (Signed)
Done

## 2018-01-23 NOTE — Telephone Encounter (Signed)
Pt called office, requests rx for Linzess 272mcg be sent to [facility] in [place]. Insurance company told her 90 day supply would be cheaper.

## 2018-02-12 DIAGNOSIS — H33021 Retinal detachment with multiple breaks, right eye: Secondary | ICD-10-CM | POA: Diagnosis not present

## 2018-02-12 DIAGNOSIS — Z8669 Personal history of other diseases of the nervous system and sense organs: Secondary | ICD-10-CM | POA: Diagnosis not present

## 2018-02-12 DIAGNOSIS — H15012 Anterior scleritis, left eye: Secondary | ICD-10-CM | POA: Diagnosis not present

## 2018-02-12 DIAGNOSIS — H3521 Other non-diabetic proliferative retinopathy, right eye: Secondary | ICD-10-CM | POA: Diagnosis not present

## 2018-02-12 DIAGNOSIS — H53002 Unspecified amblyopia, left eye: Secondary | ICD-10-CM | POA: Diagnosis not present

## 2018-03-15 NOTE — Progress Notes (Signed)
[facility]  PATIENT: Sara Barnett DOB: 1965/12/25   REASON FOR VISIT: follow up for periodic limb movements in sleep HISTORY FROM:patient    HISTORY OF PRESENT ILLNESS: Sara Barnett is a 52 year old right-handed woman with an underlying medical history of hypertension, congenital cataracts, s/p surgeries as a child, lens implants in her 79s, detached retina, hyperlipidemia, impaired fasting glucose, anxiety, depression, asthma, reflux disease,Status post lower back surgery,and obesity, who presents for follow-up consultation of her sleep disturbance, after recent sleep study testing. The patient is unaccompanied today. I first met her on 07/10/2017 at the request of her primary care physician, at which time she reported snoring and daytime somnolence. I asked her to return for a sleep study. She had a baseline sleep study on 12/01/2017. Sleep efficiency was reduced at 60.7% with delayed at 138.5 minutes and REM latency was also markedly delayed at 253.5 minutes. She had an increased wake after sleep onset with mild sleep fragmentation noted and one longer period of wakefulness. She had absent slow-wave sleep and markedly increased percentage of stage II sleep, significantly low percentage of REM sleep at 3.9%. Total AHI was 3 per hour, REM AHI was 21.8 per hour, supine AHI borderline at 5.4 per hour, average oxygen saturation 96%, nadir was 89%, she had intermittent mild snoring. PLM index was 46.5 per hour and PLM arousal index was 12.5 per hour.  Today, 12/15/2017: She reports no new issues, but sleep is difficult at times, feels sluggish in her thinking at times. She reports having coded during one of her eye surgeries. She used to be sharper she feels. She is worried about hereditary condition. She wonders if she is at risk for spinocerebellar ataxia. She is advised that the risk depends on the type of ataxia. She does endorse some restless leg symptoms. Her balance  is not as good. She endorses waking up with a startle at times. She does not sleep very well at night and does not wake up rested, she is tired during the day. UPDATE 5/20/2019CM Sara Barnett, 52 year old female returns for follow-up.  She was placed on Mirapex after her last visit .  Her ferritin level was low at 17.  Her iron saturation was also low.  She was placed on over-the-counter iron supplement.  She was made aware these levels  can exacerbate restless leg symptoms or leg movements.  We will recheck levels today.  She reports that her balance is not good however she has not had any falls she returns for reevaluation Previously:  07/10/2017: (She) reports snoring and excessive daytime somnolence. I reviewed your office note from 07/01/2017, which you kindly included. Her Epworth sleepiness score is 14 out of 24 today, fatigue score is 59 out of 63. She lives with her 2 daughters. She is on disability, quit smoking in 2009, drinks alcohol occasionally, caffeine in the form of energy drinks about 1 per day, coffee 2-3 cups, tea occasional.  She denies morning headaches, nocturia is about 1/night. She goes to bed between 10-11 PM and WT is around 7:30 PM.She does not watch TV in bed.She reports sleep talking and denies telltale Sx of RLS and no PLMs are reported.  She takes clonazepam, sometimes at night for sleep. She takes Robaxin prn.    REVIEW OF SYSTEMS: Full 14 system review of systems performed and notable only for those listed, all others are neg:  Constitutional: neg  Cardiovascular: neg Ear/Nose/Throat: neg  Skin: neg Eyes: neg Respiratory: neg Gastroitestinal: neg  Hematology/Lymphatic: neg  Endocrine: neg Musculoskeletal:neg Allergy/Immunology: neg Neurological: neg Psychiatric: neg Sleep : Restless leg   ALLERGIES: Allergies  Allergen Reactions  . Methylprednisolone Anaphylaxis    CARDIAC ARREST/PEA   . Augmentin [Amoxicillin-Pot Clavulanate] Other (See  Comments)    "FEELING HOT"; EXTREME SLEEPINESS  . Doxycycline Itching    THROAT SWELLING  . Hydrocodone-Acetaminophen Itching and Other (See Comments)    THROAT SWELLING  . Statins Other (See Comments)    JOINT PAIN, swellling    HOME MEDICATIONS: Outpatient Medications Prior to Visit  Medication Sig Dispense Refill  . amLODipine-benazepril (LOTREL) 10-40 MG per capsule Take 1 capsule by mouth daily.    . cholecalciferol (VITAMIN D) 1000 units tablet Take 2,000 Units by mouth daily. Doesn't take daily    . clonazePAM (KLONOPIN) 0.5 MG tablet Take 0.5 mg by mouth 2 (two) times daily as needed for anxiety.    Marland Kitchen dextromethorphan-guaiFENesin (MUCINEX DM) 30-600 MG 12hr tablet Take 1 tablet by mouth as needed.     . DULoxetine (CYMBALTA) 30 MG capsule 30 mg daily.    . Ferrous Gluconate (IRON) 240 (27 Fe) MG TABS Take 240 mg by mouth daily.    . fluticasone (FLONASE) 50 MCG/ACT nasal spray As needed  3  . lansoprazole (PREVACID) 30 MG capsule Take 30 mg by mouth daily at 12 noon.    . linaclotide (LINZESS) 290 MCG CAPS capsule Take 1 capsule (290 mcg total) by mouth daily before breakfast. 90 capsule 3  . methocarbamol (ROBAXIN) 500 MG tablet Take 500 mg by mouth 3 (three) times daily as needed.    . Multiple Vitamin (MULTIVITAMIN) tablet Take 1 tablet by mouth daily.    . Multiple Vitamins-Minerals (HAIR SKIN NAILS PO) Take by mouth.    Marland Kitchen OVER THE COUNTER MEDICATION Take by mouth daily. HAIR LA VIE    . OVER THE COUNTER MEDICATION Take by mouth daily. HAIR GROW    . phentermine (ADIPEX-P) 37.5 MG tablet   2  . polyethylene glycol (MIRALAX / GLYCOLAX) packet Take 17 g by mouth daily as needed.     . pramipexole (MIRAPEX) 0.125 MG tablet Take 0.125 mg by mouth at bedtime. 3 tablets each evening    . Psyllium (METAMUCIL FIBER PO) Take by mouth as needed.    . vitamin A 25000 UNIT capsule Take 25,000 Units by mouth as needed.     . DULoxetine (CYMBALTA) 60 MG capsule Take 60 mg by mouth  daily.    . IRON PO Take by mouth daily.     No facility-administered medications prior to visit.     PAST MEDICAL HISTORY: Past Medical History:  Diagnosis Date  . Abnormally small mouth   . Anxiety   . Arthritis    knees  . Chronic ethmoidal sinusitis 07/2017  . Chronic maxillary sinusitis 07/2017  . Complication of anesthesia    anaphylaxis after Methylprednisolone injection - cardiac arrest/PEA  . 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 Constipation   . Depression   . Deviated nasal septum 07/2017  . GERD (gastroesophageal reflux disease)   . History of cardiac murmur    states no known problems, no cardiologist  . Hypertension    states under control with meds., has been on med. x 17 years  . Legally blind   . Nasal turbinate hypertrophy 07/2017  . Rash of back 08/18/2017    PAST SURGICAL HISTORY: Past Surgical History:  Procedure Laterality Date  . ABLATION ON ENDOMETRIOSIS    .  BUNIONECTOMY Right   . CATARACT EXTRACTION, BILATERAL     age 14 year  . CESAREAN SECTION     x 2  . ENDOSCOPIC CONCHA BULLOSA RESECTION Bilateral 08/25/2017   Procedure: BILATERAL ENDOSCOPIC CONCHA BULLOSA RESECTION;  Surgeon: Leta Baptist, MD;  Location: [place];  Service: ENT;  Laterality: Bilateral;  . ETHMOIDECTOMY Bilateral 08/25/2017   Procedure: BILATERAL TOTAL ETHMOIDECTOMY;  Surgeon: Leta Baptist, MD;  Location: [place];  Service: ENT;  Laterality: Bilateral;  . EYE SURGERY    . INTRAOCULAR LENS INSERTION Bilateral    age 89s  . LUMBAR FUSION  07/05/2004  . LUMBAR LAMINECTOMY  07/05/2004   L5-S1  . MAXILLARY ANTROSTOMY Bilateral 08/25/2017   Procedure: BILATERAL MAXILLARY ANTROSTOMY WITH TISSUE REMOVAL;  Surgeon: Leta Baptist, MD;  Location: [place];  Service: ENT;  Laterality: Bilateral;  . MEMBRANE PEEL Right 11/06/2011; 08/06/2013  . NASAL SEPTOPLASTY W/ TURBINOPLASTY Bilateral 08/25/2017   Procedure: NASAL SEPTOPLASTY WITH  BILATERAL TURBINATE REDUCTION;  Surgeon: Leta Baptist, MD;  Location: [place];  Service: ENT;  Laterality: Bilateral;  . PARS PLANA VITRECTOMY Right 03/22/2011; 08/21/2011; 12/20/2011; 05/13/2012; 11/20/2012; 05/12/2013; 08/06/2013; 05/17/2016  . PARS PLANA VITRECTOMY W/ SCLERAL BUCKLE Left 04/21/2015  . PLANTAR FASCIA RELEASE Right   . SINUS ENDO W/FUSION Bilateral 08/25/2017   Procedure: ENDOSCOPIC SINUS SURGERY WITH FUSION NAVIGATION;  Surgeon: Leta Baptist, MD;  Location: [place];  Service: ENT;  Laterality: Bilateral;  . TUBAL LIGATION      FAMILY HISTORY: Family History  Problem Relation Age of Onset  . Depression Mother   . Cancer Father        prostate  . Hypertension Father   . Hearing loss Father   . Other Father        dialysis  . Vision loss Daughter        cataracts  . Stroke Maternal Grandmother   . Other Paternal Grandmother        lung issues  . Vision loss Daughter        cataracts    SOCIAL HISTORY: Social History   Socioeconomic History  . Marital status: Divorced    Spouse name: Not on file  . Number of children: Not on file  . Years of education: Not on file  . Highest education level: Not on file  Occupational History  . Not on file  Social Needs  . Financial resource strain: Not on file  . Food insecurity:    Worry: Not on file    Inability: Not on file  . Transportation needs:    Medical: Not on file    Non-medical: Not on file  Tobacco Use  . Smoking status: Former Smoker    Last attempt to quit: 05/24/2009    Years since quitting: 8.8  . Smokeless tobacco: Never Used  Substance and Sexual Activity  . Alcohol use: Yes    Comment: occasionally  . Drug use: No  . Sexual activity: Not Currently    Birth control/protection: None, Surgical    Comment: tubal and ablation  Lifestyle  . Physical activity:    Days per week: Not on file    Minutes per session: Not on file  . Stress: Not on file  Relationships  .  Social connections:    Talks on phone: Not on file    Gets together: Not on file    Attends religious service: Not on file  Active member of club or organization: Not on file    Attends meetings of clubs or organizations: Not on file    Relationship status: Not on file  . Intimate partner violence:    Fear of current or ex partner: Not on file    Emotionally abused: Not on file    Physically abused: Not on file    Forced sexual activity: Not on file  Other Topics Concern  . Not on file  Social History Narrative  . Not on file     PHYSICAL EXAM  Vitals:   03/16/18 1338  BP: 121/81  Pulse: 81  Weight: 177 lb (80.3 kg)  Height: 5' 2.5" (1.588 m)   Body mass index is 31.86 kg/m.  Generalized: Well developed, in no acute distress  Head: normocephalic and atraumatic,. Oropharynx benign  Neck: Supple,  Musculoskeletal: No deformity   Neurological examination   Mentation: Alert oriented to time, place, history taking. Attention span and concentration appropriate. Recent and remote memory intact.  Follows all commands speech and language fluent.   Cranial nerve II-XII: .Irregular and equal pupils not clearly reactive extraocular tracking good  visual field were full on confrontational test. Facial sensation and strength were normal. hearing was intact to finger rubbing bilaterally. Uvula tongue midline. head turning and shoulder shrug were normal and symmetric.Tongue protrusion into cheek strength was normal. Motor: normal bulk and tone, full strength in the BUE, BLE, fine finger movements normal, no pronator drift. No focal weakness Sensory: normal and symmetric to light touch,  Coordination: finger-nose-finger, heel-to-shin bilaterally, no dysmetria Reflexes: 1+ upper lower and symmetric, plantar responses were flexor bilaterally. Gait and Station: Rising up from seated position without assistance, normal stance,  moderate stride, good arm swing, smooth turning, able to  perform tiptoe, and heel walking without difficulty. Tandem gait is steady  DIAGNOSTIC DATA (LABS, IMAGING, TESTING) - I reviewed patient records, labs, notes, testing and imaging myself where available.  Lab Results  Component Value Date   WBC 5.0 12/15/2017   HGB 14.0 12/15/2017   HCT 41.9 12/15/2017   MCV 88 12/15/2017   PLT 363 12/15/2017      Component Value Date/Time   NA 138 12/13/2015 1646   K 4.5 12/13/2015 1646   CL 98 12/13/2015 1646   CO2 24 12/13/2015 1646   GLUCOSE 91 12/13/2015 1646   GLUCOSE 94 05/05/2014 1503   BUN 10 12/13/2015 1646   CREATININE 0.72 12/13/2015 1646   CALCIUM 9.7 12/13/2015 1646   PROT 7.1 12/13/2015 1646   ALBUMIN 4.3 12/13/2015 1646   AST 21 12/13/2015 1646   ALT 26 12/13/2015 1646   ALKPHOS 77 12/13/2015 1646   BILITOT 0.5 12/13/2015 1646   GFRNONAA 100 12/13/2015 1646   GFRAA 115 12/13/2015 1646    Lab Results  Component Value Date   VVOHYWVP71 062 12/13/2015     ASSESSMENT AND PLAN  Olivia Mackie L Bondurantis a very pleasant 67 year oldfemalewith an underlying medical history of hypertension, congenital cataracts, s/p surgeries as a child, lens implants in her 43s, detached retina, hyperlipidemia, impaired fasting glucose, anxiety, depression, asthma, reflux disease,Status post lower back surgery,and obesity, who presents for follow-up consultation of her sleep disorder, after recent sleep study testing. She reports issues with sleep onset and sleep maintenance at night, she does not always have telltale symptoms of restless legs but does feel restless at night and does not wake up rested. Her sleep study from 12/01/2017 showed no significant obstructive sleep apnea, she had  significant PLMS with and without arousals. Of note, she is on Cymbalta which can cause PLMS and also exacerbate restless leg symptoms. Blood work for ferritin and iron stores returned low.  We will repeat today    Ferritin and iron studies today  Continue OTC  iron supplement  Continue Mirapex F/U in 6  To 8 months Dennie Bible, [facility], [facility], APRN  [facility] [address], [place], [place] [address] [phone]

## 2018-03-16 ENCOUNTER — Ambulatory Visit (INDEPENDENT_AMBULATORY_CARE_PROVIDER_SITE_OTHER): Payer: PPO | Admitting: Nurse Practitioner

## 2018-03-16 ENCOUNTER — Encounter: Payer: Self-pay | Admitting: Nurse Practitioner

## 2018-03-16 DIAGNOSIS — G2581 Restless legs syndrome: Secondary | ICD-10-CM | POA: Diagnosis not present

## 2018-03-16 NOTE — Patient Instructions (Addendum)
Ferritin and iron studies today  Continue OTC iron supplement  Continue Mirapex F/U in 6  To 8 months

## 2018-03-17 LAB — IRON AND TIBC
IRON: 113 ug/dL (ref 27–159)
Iron Saturation: 33 % (ref 15–55)
TIBC: 346 ug/dL (ref 250–450)
UIBC: 233 ug/dL (ref 131–425)

## 2018-03-17 LAB — FERRITIN: FERRITIN: 25 ng/mL (ref 15–150)

## 2018-03-18 ENCOUNTER — Telehealth: Payer: Self-pay | Admitting: *Deleted

## 2018-03-18 NOTE — Telephone Encounter (Signed)
Spoke with patient and informed her that her ferritin is still a little low but improved. [name] advises she continue taking her iron supplement . [name] will recheck it at her next visit. Patient verbalized understanding, appreciation.

## 2018-04-02 DIAGNOSIS — R7301 Impaired fasting glucose: Secondary | ICD-10-CM | POA: Diagnosis not present

## 2018-04-02 DIAGNOSIS — I10 Essential (primary) hypertension: Secondary | ICD-10-CM | POA: Diagnosis not present

## 2018-04-02 DIAGNOSIS — S61203S Unspecified open wound of left middle finger without damage to nail, sequela: Secondary | ICD-10-CM | POA: Diagnosis not present

## 2018-04-02 DIAGNOSIS — J0100 Acute maxillary sinusitis, unspecified: Secondary | ICD-10-CM | POA: Diagnosis not present

## 2018-04-02 DIAGNOSIS — F411 Generalized anxiety disorder: Secondary | ICD-10-CM | POA: Diagnosis not present

## 2018-04-02 DIAGNOSIS — M79601 Pain in right arm: Secondary | ICD-10-CM | POA: Diagnosis not present

## 2018-04-02 DIAGNOSIS — Z8669 Personal history of other diseases of the nervous system and sense organs: Secondary | ICD-10-CM | POA: Diagnosis not present

## 2018-04-02 DIAGNOSIS — M542 Cervicalgia: Secondary | ICD-10-CM | POA: Diagnosis not present

## 2018-04-02 DIAGNOSIS — E785 Hyperlipidemia, unspecified: Secondary | ICD-10-CM | POA: Diagnosis not present

## 2018-04-02 DIAGNOSIS — Z6831 Body mass index (BMI) 31.0-31.9, adult: Secondary | ICD-10-CM | POA: Diagnosis not present

## 2018-04-02 DIAGNOSIS — K219 Gastro-esophageal reflux disease without esophagitis: Secondary | ICD-10-CM | POA: Diagnosis not present

## 2018-04-02 DIAGNOSIS — J019 Acute sinusitis, unspecified: Secondary | ICD-10-CM | POA: Diagnosis not present

## 2018-04-06 ENCOUNTER — Other Ambulatory Visit (HOSPITAL_COMMUNITY): Payer: Self-pay | Admitting: Internal Medicine

## 2018-04-06 DIAGNOSIS — R609 Edema, unspecified: Secondary | ICD-10-CM

## 2018-04-06 DIAGNOSIS — M79601 Pain in right arm: Secondary | ICD-10-CM

## 2018-04-10 ENCOUNTER — Ambulatory Visit (HOSPITAL_COMMUNITY)
Admission: RE | Admit: 2018-04-10 | Discharge: 2018-04-10 | Disposition: A | Discharge status: Home / Self Care | Payer: PPO | Source: Ambulatory Visit | Attending: Internal Medicine | Admitting: Internal Medicine

## 2018-04-10 DIAGNOSIS — M79601 Pain in right arm: Secondary | ICD-10-CM | POA: Diagnosis not present

## 2018-04-10 DIAGNOSIS — R609 Edema, unspecified: Secondary | ICD-10-CM | POA: Insufficient documentation

## 2018-04-10 DIAGNOSIS — M7989 Other specified soft tissue disorders: Secondary | ICD-10-CM | POA: Diagnosis not present

## 2018-04-15 ENCOUNTER — Ambulatory Visit: Payer: PPO | Admitting: Gastroenterology

## 2018-04-17 DIAGNOSIS — M25562 Pain in left knee: Secondary | ICD-10-CM | POA: Diagnosis not present

## 2018-04-17 DIAGNOSIS — M25561 Pain in right knee: Secondary | ICD-10-CM | POA: Diagnosis not present

## 2018-04-17 DIAGNOSIS — M179 Osteoarthritis of knee, unspecified: Secondary | ICD-10-CM | POA: Insufficient documentation

## 2018-04-17 DIAGNOSIS — M1710 Unilateral primary osteoarthritis, unspecified knee: Secondary | ICD-10-CM | POA: Insufficient documentation

## 2018-04-17 DIAGNOSIS — M1712 Unilateral primary osteoarthritis, left knee: Secondary | ICD-10-CM | POA: Diagnosis not present

## 2018-04-17 DIAGNOSIS — M1711 Unilateral primary osteoarthritis, right knee: Secondary | ICD-10-CM | POA: Diagnosis not present

## 2018-04-27 DIAGNOSIS — M1711 Unilateral primary osteoarthritis, right knee: Secondary | ICD-10-CM | POA: Diagnosis not present

## 2018-04-27 DIAGNOSIS — M1712 Unilateral primary osteoarthritis, left knee: Secondary | ICD-10-CM | POA: Diagnosis not present

## 2018-05-04 DIAGNOSIS — M1712 Unilateral primary osteoarthritis, left knee: Secondary | ICD-10-CM | POA: Diagnosis not present

## 2018-05-04 DIAGNOSIS — M1711 Unilateral primary osteoarthritis, right knee: Secondary | ICD-10-CM | POA: Diagnosis not present

## 2018-05-11 DIAGNOSIS — M1712 Unilateral primary osteoarthritis, left knee: Secondary | ICD-10-CM | POA: Diagnosis not present

## 2018-05-11 DIAGNOSIS — M1711 Unilateral primary osteoarthritis, right knee: Secondary | ICD-10-CM | POA: Diagnosis not present

## 2018-05-29 DIAGNOSIS — D1801 Hemangioma of skin and subcutaneous tissue: Secondary | ICD-10-CM | POA: Diagnosis not present

## 2018-05-29 DIAGNOSIS — L980 Pyogenic granuloma: Secondary | ICD-10-CM | POA: Diagnosis not present

## 2018-05-29 DIAGNOSIS — D485 Neoplasm of uncertain behavior of skin: Secondary | ICD-10-CM | POA: Diagnosis not present

## 2018-05-29 DIAGNOSIS — L814 Other melanin hyperpigmentation: Secondary | ICD-10-CM | POA: Diagnosis not present

## 2018-06-08 DIAGNOSIS — Z6831 Body mass index (BMI) 31.0-31.9, adult: Secondary | ICD-10-CM | POA: Diagnosis not present

## 2018-06-08 DIAGNOSIS — Z79899 Other long term (current) drug therapy: Secondary | ICD-10-CM | POA: Diagnosis not present

## 2018-06-08 DIAGNOSIS — M25569 Pain in unspecified knee: Secondary | ICD-10-CM | POA: Diagnosis not present

## 2018-06-08 DIAGNOSIS — F411 Generalized anxiety disorder: Secondary | ICD-10-CM | POA: Diagnosis not present

## 2018-06-26 ENCOUNTER — Other Ambulatory Visit: Payer: Self-pay | Admitting: *Deleted

## 2018-06-26 MED ORDER — PRAMIPEXOLE DIHYDROCHLORIDE 0.125 MG PO TABS: ORAL_TABLET | ORAL | 1 refills | Status: DC

## 2018-07-09 ENCOUNTER — Telehealth: Payer: Self-pay | Admitting: *Deleted

## 2018-07-09 ENCOUNTER — Encounter: Payer: Self-pay | Admitting: Gastroenterology

## 2018-07-09 ENCOUNTER — Encounter: Payer: Self-pay | Admitting: *Deleted

## 2018-07-09 ENCOUNTER — Ambulatory Visit: Payer: PPO | Admitting: Gastroenterology

## 2018-07-09 ENCOUNTER — Other Ambulatory Visit: Payer: Self-pay | Admitting: *Deleted

## 2018-07-09 DIAGNOSIS — Z1211 Encounter for screening for malignant neoplasm of colon: Secondary | ICD-10-CM

## 2018-07-09 MED ORDER — PEG 3350-KCL-NA BICARB-NACL 420 G PO SOLR: 4000.0000 mL | Freq: Once | ORAL | 0 refills | Status: AC

## 2018-07-09 NOTE — Patient Instructions (Addendum)
You can go back to taking Linzess and do it every other day if constipation recurs.  We have set you up for a colonoscopy with Dr. Oneida Alar  in the near future.  We can consider hemorrhoid banding for a date in November!  I enjoyed seeing you again today! As you know, I value our relationship and want to provide genuine, compassionate, and quality care. I welcome your feedback. If you receive a survey regarding your visit,  I greatly appreciate you taking time to fill this out. See you next time!  Annitta Needs, PhD, ANP-BC [facility]

## 2018-07-09 NOTE — Telephone Encounter (Signed)
Pre-op scheduled for 08/18/18 at 12:45pm. Patient aware. Letter mailed.

## 2018-07-09 NOTE — Progress Notes (Signed)
Referring Provider: Celene Squibb, MD Primary Care Physician:  Celene Squibb, MD  Primary GI: Dr. Oneida Alar   Chief Complaint  Patient presents with  . Diarrhea  . Hemorrhoids    HPI:   Sara Barnett is a 52 y.o. female presenting today with a history of chronic constipation, last seen March 2019. Previously trialed Amitiza without improvement and has done best on Linzess 290 mcg daily.   Due for routine screening colonoscopy. Started having diarrhea about 3 weeks ago. Stopped Linzess and diarrhea continues. Having more than 3 loose stools per day. No recent antibiotics. Watery. No rectal bleeding. No sick contacts. No fever or chills. No abdominal pain. No N/V.   Stopped Cymbalta about 3 weeks ago. Had horrible symptoms for about 1 week. This is when diarrhea started. No diarrhea today thus far. Feels bloated in the afternoon. Declining stool studies.   Past Medical History:  Diagnosis Date  . Abnormally small mouth   . Anxiety   . Arthritis    knees  . Chronic ethmoidal sinusitis 07/2017  . Chronic maxillary sinusitis 07/2017  . Complication of anesthesia    anaphylaxis after Methylprednisolone injection - cardiac arrest/PEA  . 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 Constipation   . Depression   . Deviated nasal septum 07/2017  . GERD (gastroesophageal reflux disease)   . History of cardiac murmur    states no known problems, no cardiologist  . Hypertension    states under control with meds., has been on med. x 17 years  . Legally blind   . Nasal turbinate hypertrophy 07/2017  . Rash of back 08/18/2017    Past Surgical History:  Procedure Laterality Date  . ABLATION ON ENDOMETRIOSIS    . BUNIONECTOMY Right   . CATARACT EXTRACTION, BILATERAL     age 49 year  . CESAREAN SECTION     x 2  . ENDOSCOPIC CONCHA BULLOSA RESECTION Bilateral 08/25/2017   Procedure: BILATERAL ENDOSCOPIC CONCHA BULLOSA RESECTION;  Surgeon: Leta Baptist, MD;  Location: [facility];  Service: ENT;  Laterality: Bilateral;  . ETHMOIDECTOMY Bilateral 08/25/2017   Procedure: BILATERAL TOTAL ETHMOIDECTOMY;  Surgeon: Leta Baptist, MD;  Location: [facility];  Service: ENT;  Laterality: Bilateral;  . EYE SURGERY    . INTRAOCULAR LENS INSERTION Bilateral    age 66s  . LUMBAR FUSION  07/05/2004  . LUMBAR LAMINECTOMY  07/05/2004   L5-S1  . MAXILLARY ANTROSTOMY Bilateral 08/25/2017   Procedure: BILATERAL MAXILLARY ANTROSTOMY WITH TISSUE REMOVAL;  Surgeon: Leta Baptist, MD;  Location: [facility];  Service: ENT;  Laterality: Bilateral;  . MEMBRANE PEEL Right 11/06/2011; 08/06/2013  . NASAL SEPTOPLASTY W/ TURBINOPLASTY Bilateral 08/25/2017   Procedure: NASAL SEPTOPLASTY WITH BILATERAL TURBINATE REDUCTION;  Surgeon: Leta Baptist, MD;  Location: [place] [facility];  Service: ENT;  Laterality: Bilateral;  . PARS PLANA VITRECTOMY Right 03/22/2011; 08/21/2011; 12/20/2011; 05/13/2012; 11/20/2012; 05/12/2013; 08/06/2013; 05/17/2016  . PARS PLANA VITRECTOMY W/ SCLERAL BUCKLE Left 04/21/2015  . PLANTAR FASCIA RELEASE Right   . SINUS ENDO W/FUSION Bilateral 08/25/2017   Procedure: ENDOSCOPIC SINUS SURGERY WITH FUSION NAVIGATION;  Surgeon: Leta Baptist, MD;  Location: [facility];  Service: ENT;  Laterality: Bilateral;  . TUBAL LIGATION      Current Outpatient Medications  Medication Sig Dispense Refill  . amLODipine-benazepril (LOTREL) 10-40 MG per capsule Take 1 capsule by mouth daily.    . Ascorbic Acid (VITAMIN C PO)  Take by mouth daily.    . clonazePAM (KLONOPIN) 0.5 MG tablet Take 0.5 mg by mouth 2 (two) times daily as needed for anxiety.    Marland Kitchen dextromethorphan-guaiFENesin (MUCINEX DM) 30-600 MG 12hr tablet Take 1 tablet by mouth as needed.     . Docosahexaenoic Acid (DHA PO) Take by mouth daily.    . Ferrous Gluconate (IRON) 240 (27 Fe) MG TABS Take 240 mg by mouth daily.    . fluticasone (FLONASE) 50 MCG/ACT nasal spray As needed  3  . lansoprazole  (PREVACID) 30 MG capsule Take 30 mg by mouth daily at 12 noon.    . linaclotide (LINZESS) 290 MCG CAPS capsule Take 1 capsule (290 mcg total) by mouth daily before breakfast. 90 capsule 3  . methocarbamol (ROBAXIN) 500 MG tablet Take 500 mg by mouth 3 (three) times daily as needed.    . Multiple Vitamin (MULTIVITAMIN) tablet Take 1 tablet by mouth daily.    . Multiple Vitamins-Minerals (HAIR SKIN NAILS PO) Take by mouth.    . Omega-3 Fatty Acids (FISH OIL PO) Take by mouth daily. Sometimes forgets    . OVER THE COUNTER MEDICATION Take by mouth daily. HAIR GROW    . phentermine (ADIPEX-P) 37.5 MG tablet daily.   2  . polyethylene glycol (MIRALAX / GLYCOLAX) packet Take 17 g by mouth daily as needed.     . pramipexole (MIRAPEX) 0.125 MG tablet Take 3 tablets each evening. Take 90-120 min before bedtime. 270 tablet 1  . Psyllium (METAMUCIL FIBER PO) Take by mouth as needed.     No current facility-administered medications for this visit.     Allergies as of 07/09/2018 - Review Complete 07/09/2018  Allergen Reaction Noted  . Methylprednisolone Anaphylaxis 12/13/2015  . Augmentin [amoxicillin-pot clavulanate] Other (See Comments) 08/18/2017  . Doxycycline Itching 12/18/2011  . Hydrocodone-acetaminophen Itching and Other (See Comments) 12/13/2015  . Statins Other (See Comments) 12/18/2011    Family History  Problem Relation Age of Onset  . Depression Mother   . Cancer Father        prostate  . Hypertension Father   . Hearing loss Father   . Other Father        dialysis  . Vision loss Daughter        cataracts  . Stroke Maternal Grandmother   . Other Paternal Grandmother        lung issues  . Vision loss Daughter        cataracts    Social History   Socioeconomic History  . Marital status: Divorced    Spouse name: Not on file  . Number of children: Not on file  . Years of education: Not on file  . Highest education level: Not on file  Occupational History  . Not on file    Social Needs  . Financial resource strain: Not on file  . Food insecurity:    Worry: Not on file    Inability: Not on file  . Transportation needs:    Medical: Not on file    Non-medical: Not on file  Tobacco Use  . Smoking status: Former Smoker    Last attempt to quit: 05/24/2009    Years since quitting: 9.1  . Smokeless tobacco: Never Used  Substance and Sexual Activity  . Alcohol use: Yes    Comment: occasionally  . Drug use: No  . Sexual activity: Not Currently    Birth control/protection: None, Surgical    Comment: tubal and ablation  Lifestyle  . Physical activity:    Days per week: Not on file    Minutes per session: Not on file  . Stress: Not on file  Relationships  . Social connections:    Talks on phone: Not on file    Gets together: Not on file    Attends religious service: Not on file    Active member of club or organization: Not on file    Attends meetings of clubs or organizations: Not on file    Relationship status: Not on file  Other Topics Concern  . Not on file  Social History Narrative  . Not on file    Review of Systems: Gen: Denies fever, chills, anorexia. Denies fatigue, weakness, weight loss.  CV: Denies chest pain, palpitations, syncope, peripheral edema, and claudication. Resp: Denies dyspnea at rest, cough, wheezing, coughing up blood, and pleurisy. GI: see HPI  Derm: Denies rash, itching, dry skin Psych: Denies depression, anxiety, memory loss, confusion. No homicidal or suicidal ideation.  Heme: see HPI   Physical Exam: BP 120/81   Pulse 86   Temp (!) 97.5 F (36.4 C) (Oral)   Ht 5\' 3"  (1.6 m)   Wt 171 lb (77.6 kg)   LMP 06/29/2018 (Approximate)   BMI 30.29 kg/m  General:   Alert and oriented. No distress noted. Pleasant and cooperative.  Head:  Normocephalic and atraumatic. Eyes:  Conjuctiva clear without scleral icterus. Mouth:  Oral mucosa pink and moist. Good dentition. Abdomen:  +BS, soft, non-tender and non-distended. No  rebound or guarding. No HSM or masses noted. Msk:  Symmetrical without gross deformities. Normal posture. Extremities:  Without edema. Neurologic:  Alert and  oriented x4 Psych:  Alert and cooperative. Normal mood and affect.

## 2018-07-13 DIAGNOSIS — E6609 Other obesity due to excess calories: Secondary | ICD-10-CM | POA: Diagnosis not present

## 2018-07-13 DIAGNOSIS — M2011 Hallux valgus (acquired), right foot: Secondary | ICD-10-CM | POA: Diagnosis not present

## 2018-07-13 DIAGNOSIS — I10 Essential (primary) hypertension: Secondary | ICD-10-CM | POA: Diagnosis not present

## 2018-07-13 DIAGNOSIS — B351 Tinea unguium: Secondary | ICD-10-CM | POA: Diagnosis not present

## 2018-07-13 DIAGNOSIS — M722 Plantar fascial fibromatosis: Secondary | ICD-10-CM | POA: Diagnosis not present

## 2018-07-13 DIAGNOSIS — Z6832 Body mass index (BMI) 32.0-32.9, adult: Secondary | ICD-10-CM | POA: Diagnosis not present

## 2018-07-15 ENCOUNTER — Telehealth: Payer: Self-pay | Admitting: Gastroenterology

## 2018-07-15 NOTE — Telephone Encounter (Signed)
Pt has multiple questions about her medications. Please call her at [phone]

## 2018-07-15 NOTE — Telephone Encounter (Signed)
LMOM for a return call.  

## 2018-07-19 NOTE — Assessment & Plan Note (Signed)
52 year old female with need for initial screening colonoscopy, now with new onset loose stools after stopping Cymbalta abruptly. Baseline constipation and historically managed with Linzess 290 mcg. Declining stool studies. I doubt infectious process at this point. She has had no diarrhea today. Will start back Linzess if returns to baseline constipation.   Proceed with colonoscopy with Dr. Oneida Alar in the near future. The risks, benefits, and alternatives have been discussed in detail with the patient. They state understanding and desire to proceed.  Propofol due to polypharmacy Return in follow-up thereafter

## 2018-07-20 ENCOUNTER — Other Ambulatory Visit: Payer: Self-pay

## 2018-07-20 DIAGNOSIS — R197 Diarrhea, unspecified: Secondary | ICD-10-CM

## 2018-07-20 NOTE — Progress Notes (Signed)
cc'd to pcp 

## 2018-07-20 NOTE — Telephone Encounter (Signed)
Yes. Please have her do the Cdiff toxin and Angelena lab, along with stool culture and Giardia.

## 2018-07-20 NOTE — Telephone Encounter (Signed)
Called pt, bad connection. Called again and no answer.

## 2018-07-20 NOTE — Telephone Encounter (Signed)
PT has been on the Linzess 290 mcg for about a year she said. She said Roseanne Kaufman, NP had told her to try every other day due to some diarrhea. She is taking it every other day and she did not take it today, but has had 1 loose and 2 watery stools this morning.  She wants to know if she should do stool studies, she had previously declined. She drank some tea this morning instead of coffee and still had the same problem. [name], please advise!

## 2018-07-20 NOTE — Telephone Encounter (Signed)
Lab orders have been entered and pt is aware. She will go to [facility] soon to get the containers.

## 2018-07-23 DIAGNOSIS — R197 Diarrhea, unspecified: Secondary | ICD-10-CM | POA: Diagnosis not present

## 2018-07-27 LAB — GIARDIA ANTIGEN
MICRO NUMBER:: 91160021
RESULT:: NOT DETECTED
SPECIMEN QUALITY:: ADEQUATE

## 2018-07-27 LAB — STOOL CULTURE
MICRO NUMBER: 91161253
MICRO NUMBER:: 91161251
MICRO NUMBER:: 91161252
SHIGA RESULT: NOT DETECTED
SPECIMEN QUALITY: ADEQUATE
SPECIMEN QUALITY: ADEQUATE
SPECIMEN QUALITY:: ADEQUATE

## 2018-07-27 LAB — C. DIFFICILE GDH AND TOXIN A/B
GDH ANTIGEN: NOT DETECTED
MICRO NUMBER:: 91162169
SPECIMEN QUALITY: ADEQUATE
TOXIN A AND B: NOT DETECTED

## 2018-07-28 NOTE — Progress Notes (Signed)
PT is aware of results.  Said she actually tot constipated so she started back on the Linzess 290 mcg. The third day, today, she had very loose and watery stools and she wants advise on what to do now.

## 2018-07-28 NOTE — Progress Notes (Signed)
PT was taking the Linzess at about 5:00 am and not eating breakfast til around 8:00 am. I told her to take it 30 min prior to breakfast and see how that does. She would like to try the Linzess 145 mcg and I am leaving # 12 at the front for pick up. She said to let [name] know that she is still interested in having a hemorrhoid banding.

## 2018-07-28 NOTE — Progress Notes (Signed)
LMOM to call.

## 2018-07-28 NOTE — Progress Notes (Signed)
Make sure not taking with food. Can titrate down to 145 mcg daily, we can provide samples if needed.

## 2018-07-28 NOTE — Progress Notes (Signed)
145 mcg may be the best for her. We will see how that goes.

## 2018-07-28 NOTE — Progress Notes (Signed)
Noted  

## 2018-08-13 NOTE — Patient Instructions (Signed)
LENKA ZHAO  08/13/2018     @PREFPERIOPPHARMACY @   Your procedure is scheduled on  08/25/2018 .  Report [facility] at  1230  P.M.  Call this number if you have problems the morning of surgery:  [phone]   Remember:  Follow the diet and prep instructions given to you by Dr Nona Dell office.                       Take these medicines the morning of surgery with A SIP OF WATER  Amlodipine, clonazepam, allegra, prevacid.    Do not wear jewelry, make-up or nail polish.  Do not wear lotions, powders, or perfumes, or deodorant.  Do not shave 48 hours prior to surgery.  Men may shave face and neck.  Do not bring valuables to the hospital.  [facility] is not responsible for any belongings or valuables.  Contacts, dentures or bridgework may not be worn into surgery.  Leave your suitcase in the car.  After surgery it may be brought to your room.  For patients admitted to the hospital, discharge time will be determined by your treatment team.  Patients discharged the day of surgery will not be allowed to drive home.   Name and phone number of your driver:   family Special instructions:  STOP your phentermine until after your procedure.  Please read over the following fact sheets that you were given. Anesthesia Post-op Instructions and Care and Recovery After Surgery       Colonoscopy, Adult A colonoscopy is an exam to look at the large intestine. It is done to check for problems, such as:  Lumps (tumors).  Growths (polyps).  Swelling (inflammation).  Bleeding.  What happens before the procedure? Eating and drinking Follow instructions from your doctor about eating and drinking. These instructions may include:  A few days before the procedure - follow a low-fiber diet. ? Avoid nuts. ? Avoid seeds. ? Avoid dried fruit. ? Avoid raw fruits. ? Avoid vegetables.  1-3 days before the procedure - follow a clear liquid diet. Avoid liquids that  have red or purple dye. Drink only clear liquids, such as: ? Clear broth or bouillon. ? Black coffee or tea. ? Clear juice. ? Clear soft drinks or sports drinks. ? Gelatin dessert. ? Popsicles.  On the day of the procedure - do not eat or drink anything during the 2 hours before the procedure.  Bowel prep If you were prescribed an oral bowel prep:  Take it as told by your doctor. Starting the day before your procedure, you will need to drink a lot of liquid. The liquid will cause you to poop (have bowel movements) until your poop is almost clear or light green.  If your skin or butt gets irritated from diarrhea, you may: ? Wipe the area with wipes that have medicine in them, such as adult wet wipes with aloe and vitamin E. ? Put something on your skin that soothes the area, such as petroleum jelly.  If you throw up (vomit) while drinking the bowel prep, take a break for up to 60 minutes. Then begin the bowel prep again. If you keep throwing up and you cannot take the bowel prep without throwing up, call your doctor.  General instructions  Ask your doctor about changing or stopping your normal medicines. This is important if you take diabetes medicines or blood thinners.  Plan  to have someone take you home from the hospital or clinic. What happens during the procedure?  An IV tube may be put into one of your veins.  You will be given medicine to help you relax (sedative).  To reduce your risk of infection: ? Your doctors will wash their hands. ? Your anal area will be washed with soap.  You will be asked to lie on your side with your knees bent.  Your doctor will get a long, thin, flexible tube ready. The tube will have a camera and a light on the end.  The tube will be put into your anus.  The tube will be gently put into your large intestine.  Air will be delivered into your large intestine to keep it open. You may feel some pressure or cramping.  The camera will be  used to take photos.  A small tissue sample may be removed from your body to be looked at under a microscope (biopsy). If any possible problems are found, the tissue will be sent to a lab for testing.  If small growths are found, your doctor may remove them and have them checked for cancer.  The tube that was put into your anus will be slowly removed. The procedure may vary among doctors and hospitals. What happens after the procedure?  Your doctor will check on you often until the medicines you were given have worn off.  Do not drive for 24 hours after the procedure.  You may have a small amount of blood in your poop.  You may pass gas.  You may have mild cramps or bloating in your belly (abdomen).  It is up to you to get the results of your procedure. Ask your doctor, or the department performing the procedure, when your results will be ready. This information is not intended to replace advice given to you by your health care provider. Make sure you discuss any questions you have with your health care provider. Document Released: 11/16/2010 Document Revised: 08/14/2016 Document Reviewed: 12/26/2015 Elsevier Interactive Patient Education  2017 [employer]  Colonoscopy, Adult, Care After This sheet gives you information about how to care for yourself after your procedure. Your health care provider may also give you more specific instructions. If you have problems or questions, contact your health care provider. What can I expect after the procedure? After the procedure, it is common to have:  A small amount of blood in your stool for 24 hours after the procedure.  Some gas.  Mild abdominal cramping or bloating.  Follow these instructions at home: General instructions   For the first 24 hours after the procedure: ? Do not drive or use machinery. ? Do not sign important documents. ? Do not drink alcohol. ? Do your regular daily activities at a slower pace than  normal. ? Eat soft, easy-to-digest foods. ? Rest often.  Take over-the-counter or prescription medicines only as told by your health care provider.  It is up to you to get the results of your procedure. Ask your health care provider, or the department performing the procedure, when your results will be ready. Relieving cramping and bloating  Try walking around when you have cramps or feel bloated.  Apply heat to your abdomen as told by your health care provider. Use a heat source that your health care provider recommends, such as a moist heat pack or a heating pad. ? Place a towel between your skin and the heat source. ? Leave the  heat on for 20-30 minutes. ? Remove the heat if your skin turns bright red. This is especially important if you are unable to feel pain, heat, or cold. You may have a greater risk of getting burned. Eating and drinking  Drink enough fluid to keep your urine clear or pale yellow.  Resume your normal diet as instructed by your health care provider. Avoid heavy or fried foods that are hard to digest.  Avoid drinking alcohol for as long as instructed by your health care provider. Contact a health care provider if:  You have blood in your stool 2-3 days after the procedure. Get help right away if:  You have more than a small spotting of blood in your stool.  You pass large blood clots in your stool.  Your abdomen is swollen.  You have nausea or vomiting.  You have a fever.  You have increasing abdominal pain that is not relieved with medicine. This information is not intended to replace advice given to you by your health care provider. Make sure you discuss any questions you have with your health care provider. Document Released: 05/28/2004 Document Revised: 07/08/2016 Document Reviewed: 12/26/2015 Elsevier Interactive Patient Education  2018 [place] Anesthesia is a term that refers to techniques, procedures, and  medicines that help a person stay safe and comfortable during a medical procedure. Monitored anesthesia care, or sedation, is one type of anesthesia. Your anesthesia specialist may recommend sedation if you will be having a procedure that does not require you to be unconscious, such as:  Cataract surgery.  A dental procedure.  A biopsy.  A colonoscopy.  During the procedure, you may receive a medicine to help you relax (sedative). There are three levels of sedation:  Mild sedation. At this level, you may feel awake and relaxed. You will be able to follow directions.  Moderate sedation. At this level, you will be sleepy. You may not remember the procedure.  Deep sedation. At this level, you will be asleep. You will not remember the procedure.  The more medicine you are given, the deeper your level of sedation will be. Depending on how you respond to the procedure, the anesthesia specialist may change your level of sedation or the type of anesthesia to fit your needs. An anesthesia specialist will monitor you closely during the procedure. Let your health care provider know about:  Any allergies you have.  All medicines you are taking, including vitamins, herbs, eye drops, creams, and over-the-counter medicines.  Any use of steroids (by mouth or as a cream).  Any problems you or family members have had with sedatives and anesthetic medicines.  Any blood disorders you have.  Any surgeries you have had.  Any medical conditions you have, such as sleep apnea.  Whether you are pregnant or may be pregnant.  Any use of cigarettes, alcohol, or street drugs. What are the risks? Generally, this is a safe procedure. However, problems may occur, including:  Getting too much medicine (oversedation).  Nausea.  Allergic reaction to medicines.  Trouble breathing. If this happens, a breathing tube may be used to help with breathing. It will be removed when you are awake and breathing on  your own.  Heart trouble.  Lung trouble.  Before the procedure Staying hydrated Follow instructions from your health care provider about hydration, which may include:  Up to 2 hours before the procedure - you may continue to drink clear liquids, such as water, clear fruit juice,  black coffee, and plain tea.  Eating and drinking restrictions Follow instructions from your health care provider about eating and drinking, which may include:  8 hours before the procedure - stop eating heavy meals or foods such as meat, fried foods, or fatty foods.  6 hours before the procedure - stop eating light meals or foods, such as toast or cereal.  6 hours before the procedure - stop drinking milk or drinks that contain milk.  2 hours before the procedure - stop drinking clear liquids.  Medicines Ask your health care provider about:  Changing or stopping your regular medicines. This is especially important if you are taking diabetes medicines or blood thinners.  Taking medicines such as aspirin and ibuprofen. These medicines can thin your blood. Do not take these medicines before your procedure if your health care provider instructs you not to.  Tests and exams  You will have a physical exam.  You may have blood tests done to show: ? How well your kidneys and liver are working. ? How well your blood can clot.  General instructions  Plan to have someone take you home from the hospital or clinic.  If you will be going home right after the procedure, plan to have someone with you for 24 hours.  What happens during the procedure?  Your blood pressure, heart rate, breathing, level of pain and overall condition will be monitored.  An IV tube will be inserted into one of your veins.  Your anesthesia specialist will give you medicines as needed to keep you comfortable during the procedure. This may mean changing the level of sedation.  The procedure will be performed. After the  procedure  Your blood pressure, heart rate, breathing rate, and blood oxygen level will be monitored until the medicines you were given have worn off.  Do not drive for 24 hours if you received a sedative.  You may: ? Feel sleepy, clumsy, or nauseous. ? Feel forgetful about what happened after the procedure. ? Have a sore throat if you had a breathing tube during the procedure. ? Vomit. This information is not intended to replace advice given to you by your health care provider. Make sure you discuss any questions you have with your health care provider. Document Released: 07/10/2005 Document Revised: 03/22/2016 Document Reviewed: 02/04/2016 Elsevier Interactive Patient Education  2018 [place], Care After These instructions provide you with information about caring for yourself after your procedure. Your health care provider may also give you more specific instructions. Your treatment has been planned according to current medical practices, but problems sometimes occur. Call your health care provider if you have any problems or questions after your procedure. What can I expect after the procedure? After your procedure, it is common to:  Feel sleepy for several hours.  Feel clumsy and have poor balance for several hours.  Feel forgetful about what happened after the procedure.  Have poor judgment for several hours.  Feel nauseous or vomit.  Have a sore throat if you had a breathing tube during the procedure.  Follow these instructions at home: For at least 24 hours after the procedure:   Do not: ? Participate in activities in which you could fall or become injured. ? Drive. ? Use heavy machinery. ? Drink alcohol. ? Take sleeping pills or medicines that cause drowsiness. ? Make important decisions or sign legal documents. ? Take care of children on your own.  Rest. Eating and drinking  Follow the  diet that is recommended by your health  care provider.  If you vomit, drink water, juice, or soup when you can drink without vomiting.  Make sure you have little or no nausea before eating solid foods. General instructions  Have a responsible adult stay with you until you are awake and alert.  Take over-the-counter and prescription medicines only as told by your health care provider.  If you smoke, do not smoke without supervision.  Keep all follow-up visits as told by your health care provider. This is important. Contact a health care provider if:  You keep feeling nauseous or you keep vomiting.  You feel light-headed.  You develop a rash.  You have a fever. Get help right away if:  You have trouble breathing. This information is not intended to replace advice given to you by your health care provider. Make sure you discuss any questions you have with your health care provider. Document Released: 02/04/2016 Document Revised: 06/05/2016 Document Reviewed: 02/04/2016 Elsevier Interactive Patient Education  [employer].

## 2018-08-14 DIAGNOSIS — N390 Urinary tract infection, site not specified: Secondary | ICD-10-CM | POA: Diagnosis not present

## 2018-08-14 DIAGNOSIS — K219 Gastro-esophageal reflux disease without esophagitis: Secondary | ICD-10-CM | POA: Diagnosis not present

## 2018-08-14 DIAGNOSIS — M542 Cervicalgia: Secondary | ICD-10-CM | POA: Diagnosis not present

## 2018-08-14 DIAGNOSIS — I10 Essential (primary) hypertension: Secondary | ICD-10-CM | POA: Diagnosis not present

## 2018-08-14 DIAGNOSIS — E785 Hyperlipidemia, unspecified: Secondary | ICD-10-CM | POA: Diagnosis not present

## 2018-08-14 DIAGNOSIS — Z6830 Body mass index (BMI) 30.0-30.9, adult: Secondary | ICD-10-CM | POA: Diagnosis not present

## 2018-08-14 DIAGNOSIS — R319 Hematuria, unspecified: Secondary | ICD-10-CM | POA: Diagnosis not present

## 2018-08-14 DIAGNOSIS — J019 Acute sinusitis, unspecified: Secondary | ICD-10-CM | POA: Diagnosis not present

## 2018-08-14 DIAGNOSIS — Z6831 Body mass index (BMI) 31.0-31.9, adult: Secondary | ICD-10-CM | POA: Diagnosis not present

## 2018-08-14 DIAGNOSIS — E6609 Other obesity due to excess calories: Secondary | ICD-10-CM | POA: Diagnosis not present

## 2018-08-14 DIAGNOSIS — R7301 Impaired fasting glucose: Secondary | ICD-10-CM | POA: Diagnosis not present

## 2018-08-14 DIAGNOSIS — F411 Generalized anxiety disorder: Secondary | ICD-10-CM | POA: Diagnosis not present

## 2018-08-14 DIAGNOSIS — Z8669 Personal history of other diseases of the nervous system and sense organs: Secondary | ICD-10-CM | POA: Diagnosis not present

## 2018-08-14 DIAGNOSIS — R809 Proteinuria, unspecified: Secondary | ICD-10-CM | POA: Diagnosis not present

## 2018-08-14 DIAGNOSIS — J0100 Acute maxillary sinusitis, unspecified: Secondary | ICD-10-CM | POA: Diagnosis not present

## 2018-08-18 ENCOUNTER — Encounter (HOSPITAL_COMMUNITY): Payer: Self-pay

## 2018-08-18 ENCOUNTER — Encounter (HOSPITAL_COMMUNITY)
Admission: RE | Admit: 2018-08-18 | Discharge: 2018-08-18 | Disposition: A | Discharge status: Home / Self Care | Payer: PPO | Source: Ambulatory Visit | Attending: Gastroenterology | Admitting: Gastroenterology

## 2018-08-18 ENCOUNTER — Other Ambulatory Visit: Payer: Self-pay

## 2018-08-18 ENCOUNTER — Telehealth: Payer: Self-pay

## 2018-08-18 DIAGNOSIS — Z01818 Encounter for other preprocedural examination: Secondary | ICD-10-CM | POA: Insufficient documentation

## 2018-08-18 HISTORY — DX: Restless legs syndrome: G25.81

## 2018-08-18 LAB — CBC WITH DIFFERENTIAL/PLATELET
ABS IMMATURE GRANULOCYTES: 0.01 10*3/uL (ref 0.00–0.07)
BASOS ABS: 0.1 10*3/uL (ref 0.0–0.1)
Basophils Relative: 1 %
EOS PCT: 2 %
Eosinophils Absolute: 0.1 10*3/uL (ref 0.0–0.5)
HEMATOCRIT: 44.4 % (ref 36.0–46.0)
HEMOGLOBIN: 14.3 g/dL (ref 12.0–15.0)
Immature Granulocytes: 0 %
LYMPHS ABS: 1.4 10*3/uL (ref 0.7–4.0)
Lymphocytes Relative: 25 %
MCH: 29.7 pg (ref 26.0–34.0)
MCHC: 32.2 g/dL (ref 30.0–36.0)
MCV: 92.3 fL (ref 80.0–100.0)
MONO ABS: 0.4 10*3/uL (ref 0.1–1.0)
Monocytes Relative: 7 %
Neutro Abs: 3.7 10*3/uL (ref 1.7–7.7)
Neutrophils Relative %: 65 %
Platelets: 287 10*3/uL (ref 150–400)
RBC: 4.81 MIL/uL (ref 3.87–5.11)
RDW: 12 % (ref 11.5–15.5)
WBC: 5.7 10*3/uL (ref 4.0–10.5)
nRBC: 0 % (ref 0.0–0.2)

## 2018-08-18 LAB — BASIC METABOLIC PANEL
Anion gap: 7 (ref 5–15)
BUN: 10 mg/dL (ref 6–20)
CHLORIDE: 105 mmol/L (ref 98–111)
CO2: 23 mmol/L (ref 22–32)
Calcium: 9 mg/dL (ref 8.9–10.3)
Creatinine, Ser: 0.69 mg/dL (ref 0.44–1.00)
GFR calc non Af Amer: >60 mL/min (ref >60)
Glucose, Bld: 98 mg/dL (ref 70–99)
POTASSIUM: 3.9 mmol/L (ref 3.5–5.1)
Sodium: 135 mmol/L (ref 135–145)

## 2018-08-18 LAB — HCG, SERUM, QUALITATIVE: Preg, Serum: NEGATIVE

## 2018-08-18 NOTE — Telephone Encounter (Signed)
Pt asked if her prescription had been sent in for her colonoscopy. I don't see it, sending to [place] who scheduled. She uses [facility] in [place].

## 2018-08-18 NOTE — Progress Notes (Signed)
Pt is aware.  

## 2018-08-18 NOTE — Telephone Encounter (Signed)
Rx was called in on 07/09/18 (see med history) to [facility] [place]. [place] made patient aware. She will check with the pharmacy

## 2018-08-19 NOTE — Progress Notes (Signed)
CC'D TO PCP °

## 2018-08-23 ENCOUNTER — Telehealth: Payer: Self-pay | Admitting: Gastroenterology

## 2018-08-23 DIAGNOSIS — R300 Dysuria: Secondary | ICD-10-CM | POA: Diagnosis not present

## 2018-08-23 DIAGNOSIS — N390 Urinary tract infection, site not specified: Secondary | ICD-10-CM | POA: Diagnosis not present

## 2018-08-23 NOTE — Telephone Encounter (Signed)
Has UTI and prescribed antibiotics. Wanted to know if this was ok prior to colonoscopy. Told her no contraindication for this. Annitta Needs, PhD, ANP-[facility]

## 2018-08-25 ENCOUNTER — Telehealth: Payer: Self-pay

## 2018-08-25 ENCOUNTER — Ambulatory Visit (HOSPITAL_COMMUNITY): Payer: PPO | Admitting: Anesthesiology

## 2018-08-25 ENCOUNTER — Encounter (HOSPITAL_COMMUNITY): Payer: Self-pay | Admitting: *Deleted

## 2018-08-25 ENCOUNTER — Ambulatory Visit (HOSPITAL_COMMUNITY)
Admission: RE | Admit: 2018-08-25 | Discharge: 2018-08-25 | Disposition: A | Discharge status: Home / Self Care | Payer: PPO | Source: Ambulatory Visit | Attending: Gastroenterology | Admitting: Gastroenterology

## 2018-08-25 ENCOUNTER — Encounter (HOSPITAL_COMMUNITY)
Admission: RE | Disposition: A | Discharge status: Home / Self Care | Payer: Self-pay | Source: Ambulatory Visit | Attending: Gastroenterology

## 2018-08-25 DIAGNOSIS — F419 Anxiety disorder, unspecified: Secondary | ICD-10-CM | POA: Diagnosis not present

## 2018-08-25 DIAGNOSIS — F329 Major depressive disorder, single episode, unspecified: Secondary | ICD-10-CM | POA: Insufficient documentation

## 2018-08-25 DIAGNOSIS — I10 Essential (primary) hypertension: Secondary | ICD-10-CM | POA: Diagnosis not present

## 2018-08-25 DIAGNOSIS — K644 Residual hemorrhoidal skin tags: Secondary | ICD-10-CM | POA: Insufficient documentation

## 2018-08-25 DIAGNOSIS — Z87891 Personal history of nicotine dependence: Secondary | ICD-10-CM | POA: Insufficient documentation

## 2018-08-25 DIAGNOSIS — K5730 Diverticulosis of large intestine without perforation or abscess without bleeding: Secondary | ICD-10-CM | POA: Diagnosis not present

## 2018-08-25 DIAGNOSIS — K219 Gastro-esophageal reflux disease without esophagitis: Secondary | ICD-10-CM | POA: Insufficient documentation

## 2018-08-25 DIAGNOSIS — G2581 Restless legs syndrome: Secondary | ICD-10-CM | POA: Diagnosis not present

## 2018-08-25 DIAGNOSIS — Z1211 Encounter for screening for malignant neoplasm of colon: Secondary | ICD-10-CM | POA: Diagnosis not present

## 2018-08-25 DIAGNOSIS — Z79899 Other long term (current) drug therapy: Secondary | ICD-10-CM | POA: Diagnosis not present

## 2018-08-25 DIAGNOSIS — Z8674 Personal history of sudden cardiac arrest: Secondary | ICD-10-CM | POA: Diagnosis not present

## 2018-08-25 DIAGNOSIS — K648 Other hemorrhoids: Secondary | ICD-10-CM | POA: Diagnosis not present

## 2018-08-25 HISTORY — PX: COLONOSCOPY WITH PROPOFOL: SHX5780

## 2018-08-25 SURGERY — COLONOSCOPY WITH PROPOFOL
Anesthesia: General

## 2018-08-25 MED ORDER — LACTATED RINGERS IV SOLN
INTRAVENOUS | Status: DC
  Administered 2018-08-25: 13:00:00 via INTRAVENOUS

## 2018-08-25 MED ORDER — CHLORHEXIDINE GLUCONATE CLOTH 2 % EX PADS: 6.0000 | MEDICATED_PAD | Freq: Once | CUTANEOUS | Status: DC

## 2018-08-25 MED ORDER — PROPOFOL 500 MG/50ML IV EMUL
INTRAVENOUS | Status: DC | PRN
  Administered 2018-08-25: 150 ug/kg/min via INTRAVENOUS

## 2018-08-25 MED ORDER — PROPOFOL 10 MG/ML IV BOLUS
INTRAVENOUS | Status: AC
  Filled 2018-08-25: qty 40

## 2018-08-25 MED ORDER — PROPOFOL 10 MG/ML IV BOLUS
INTRAVENOUS | Status: DC | PRN
  Administered 2018-08-25: 30 mg via INTRAVENOUS

## 2018-08-25 NOTE — Op Note (Signed)
[facility] Patient Name: Sara Barnett Procedure Date: 08/25/2018 2:00 PM MRN: [id number] Date of Birth: 01-15-1966 Attending MD: Barney Drain MD, MD CSN: [id number] Age: 52 Admit Type: Outpatient Procedure:                Colonoscopy, SCREENING Indications:              Screening for colorectal malignant neoplasm Providers:                Barney Drain MD, MD, Janeece Riggers, RN, Randa Spike, Technician Referring MD:             Edwinna Areola. Hall MD Medicines:                Propofol per Anesthesia Complications:            No immediate complications. Estimated Blood Loss:     Estimated blood loss: none. Procedure:                Pre-Anesthesia Assessment:                           - Prior to the procedure, a History and Physical                            was performed, and patient medications and                            allergies were reviewed. The patient's tolerance of                            previous anesthesia was also reviewed. The risks                            and benefits of the procedure and the sedation                            options and risks were discussed with the patient.                            All questions were answered, and informed consent                            was obtained. Prior Anticoagulants: The patient has                            taken no previous anticoagulant or antiplatelet                            agents. ASA Grade Assessment: II - A patient with                            mild systemic disease. After reviewing the risks  and benefits, the patient was deemed in                            satisfactory condition to undergo the procedure.                            After obtaining informed consent, the colonoscope                            was passed under direct vision. Throughout the                            procedure, the patient's blood pressure, pulse, and                             oxygen saturations were monitored continuously. The                            [id number]-[id number] ([id number]) scope was introduced through                            the anus and advanced to the the cecum, identified                            by appendiceal orifice and ileocecal valve. The                            patient tolerated the procedure well. The quality                            of the bowel preparation was good. The ileocecal                            valve, appendiceal orifice, and rectum were                            photographed. The colonoscopy was somewhat                            difficult due to a tortuous colon. Successful                            completion of the procedure was aided by                            straightening and shortening the scope to obtain                            bowel loop reduction and COLOWRAP. Scope In: 2:15:24 PM Scope Out: 2:33:06 PM Scope Withdrawal Time: 0 hours 15 minutes 2 seconds  Total Procedure Duration: 0 hours 17 minutes 42 seconds  Findings:      Multiple small and large-mouthed diverticula were found in the entire       colon.  External and internal hemorrhoids were found. The hemorrhoids were       moderate.      The recto-sigmoid colon and sigmoid colon were mildly redundant. Impression:               - MODERATE Diverticulosis in the entire examined                            colon.                           - External and internal hemorrhoids.                           - Redundant LEFT colon. Moderate Sedation:      Per Anesthesia Care Recommendation:           - Patient has a contact number available for                            emergencies. The signs and symptoms of potential                            delayed complications were discussed with the                            patient. Return to normal activities tomorrow.                            Written discharge instructions were provided to the                             patient.                           - High fiber diet.                           - Continue present medications.                           - Repeat colonoscopy in 10 years for surveillance. Procedure Code(s):        --- Professional ---                           [phone], Colonoscopy, flexible; diagnostic, including                            collection of specimen(s) by brushing or washing,                            when performed (separate procedure) Diagnosis Code(s):        --- Professional ---                           Z12.11, Encounter for screening for malignant  neoplasm of colon                           K64.8, Other hemorrhoids                           K57.30, Diverticulosis of large intestine without                            perforation or abscess without bleeding                           Q43.8, Other specified congenital malformations of                            intestine CPT copyright 2018 American Medical Association. All rights reserved. The codes documented in this report are preliminary and upon coder review may  be revised to meet current compliance requirements. Barney Drain, MD Barney Drain MD, MD 08/25/2018 2:41:56 PM This report has been signed electronically. Number of Addenda: 0

## 2018-08-25 NOTE — Anesthesia Preprocedure Evaluation (Signed)
Anesthesia Evaluation  Patient identified by MRN, date of birth, ID band Patient awake    Reviewed: Allergy & Precautions, NPO status , Patient's Chart, lab work & pertinent test results  Airway Mallampati: III  TM Distance: >3 FB Neck ROM: Full    Dental no notable dental hx. (+) Teeth Intact   Pulmonary neg pulmonary ROS, former smoker,    Pulmonary exam normal breath sounds clear to auscultation       Cardiovascular Exercise Tolerance: Good hypertension, Pt. on medications negative cardio ROS Normal cardiovascular examI Rhythm:Regular Rate:Normal     Neuro/Psych Anxiety Depression negative neurological ROS  negative psych ROS   GI/Hepatic Neg liver ROS, GERD  Medicated and Controlled,  Endo/Other  negative endocrine ROS  Renal/GU negative Renal ROS  negative genitourinary   Musculoskeletal  (+) Arthritis , Osteoarthritis,    Abdominal   Peds negative pediatric ROS (+)  Hematology negative hematology ROS (+)   Anesthesia Other Findings   Reproductive/Obstetrics negative OB ROS                             Anesthesia Physical Anesthesia Plan  ASA: II  Anesthesia Plan: General   Post-op Pain Management:    Induction: Intravenous  PONV Risk Score and Plan:   Airway Management Planned: Nasal Cannula and Simple Face Mask  Additional Equipment:   Intra-op Plan:   Post-operative Plan:   Informed Consent: I have reviewed the patients History and Physical, chart, labs and discussed the procedure including the risks, benefits and alternatives for the proposed anesthesia with the patient or authorized representative who has indicated his/her understanding and acceptance.   Dental advisory given  Plan Discussed with: CRNA  Anesthesia Plan Comments:         Anesthesia Quick Evaluation

## 2018-08-25 NOTE — Anesthesia Postprocedure Evaluation (Signed)
Anesthesia Post Note  Patient: Sara Barnett  Procedure(s) Performed: COLONOSCOPY WITH PROPOFOL (N/A )  Patient location during evaluation: PACU Anesthesia Type: General Level of consciousness: awake and alert and oriented Pain management: pain level controlled Vital Signs Assessment: post-procedure vital signs reviewed and stable Respiratory status: spontaneous breathing Cardiovascular status: stable Postop Assessment: no apparent nausea or vomiting Anesthetic complications: no     Last Vitals:  Vitals:   08/25/18 1305  BP: 104/71  Pulse: 78  Resp: 18  Temp: 36.7 C  SpO2: 98%    Last Pain:  Vitals:   08/25/18 1305  TempSrc: Oral  PainSc: 0-No pain                 [name] A

## 2018-08-25 NOTE — Telephone Encounter (Signed)
Pt called- she said she had tcs today and she said she thinks [facility] told her that her hemorrhoids could not be banded and she would need to have surgery on them. She has an appt for Thursday with AB for a banding and wants to cancel it. I have cancelled that appointment.   Dr.Fields- the pt wanted me to ask you if she needs a banding or surgery?

## 2018-08-25 NOTE — Transfer of Care (Signed)
Immediate Anesthesia Transfer of Care Note  Patient: Sara Barnett  Procedure(s) Performed: COLONOSCOPY WITH PROPOFOL (N/A )  Patient Location: PACU  Anesthesia Type:MAC  Level of Consciousness: awake, alert , oriented and patient cooperative  Airway & Oxygen Therapy: Patient Spontanous Breathing  Post-op Assessment: Report given to RN and Post -op Vital signs reviewed and stable  Post vital signs: Reviewed and stable  Last Vitals:  Vitals Value Taken Time  BP 95/62 08/25/2018  2:40 PM  Temp    Pulse 81 08/25/2018  2:41 PM  Resp 21 08/25/2018  2:41 PM  SpO2 96 % 08/25/2018  2:41 PM  Vitals shown include unvalidated device data.  Last Pain:  Vitals:   08/25/18 1305  TempSrc: Oral  PainSc: 0-No pain      Patients Stated Pain Goal: 5 (54/98/26 [id number])  Complications: No apparent anesthesia complications

## 2018-08-25 NOTE — Discharge Instructions (Signed)
You DID NOT HAVE ANY POLYPS. YOU HAVE DIVERTICULOSIS IN YOUR RIGHT AND LEFT COLON. You have internal hemorrhoids.   SEE INSTRUCTIONS BELOW TO MANAGE YOUR CONSTIPATION.  TAKE LINZESS TO PREVENT CONSTIPATION.  DRINK WATER TO KEEP URINE LIGHT YELLOW.  FOLLOW A HIGH FIBER DIET. SEE INFO BELOW.  FOLLOW UP IN 6 MOS.  Next colonoscopy in 10 years.   Colonoscopy Care After Read the instructions outlined below and refer to this sheet in the next week. These discharge instructions provide you with general information on caring for yourself after you leave the hospital. While your treatment has been planned according to the most current medical practices available, unavoidable complications occasionally occur. If you have any problems or questions after discharge, call DR. FIELDS, [phone].  ACTIVITY  You may resume your regular activity, but move at a slower pace for the next 24 hours.   Take frequent rest periods for the next 24 hours.   Walking will help get rid of the air and reduce the bloated feeling in your belly (abdomen).   No driving for 24 hours (because of the medicine (anesthesia) used during the test).   You may shower.   Do not sign any important legal documents or operate any machinery for 24 hours (because of the anesthesia used during the test).    NUTRITION  Drink plenty of fluids.   You may resume your normal diet as instructed by your doctor.   Begin with a light meal and progress to your normal diet. Heavy or fried foods are harder to digest and may make you feel sick to your stomach (nauseated).   Avoid alcoholic beverages for 24 hours or as instructed.    MEDICATIONS  You may resume your normal medications.   WHAT YOU CAN EXPECT TODAY  Some feelings of bloating in the abdomen.   Passage of more gas than usual.   Spotting of blood in your stool or on the toilet paper  .  IF YOU HAD POLYPS REMOVED DURING THE COLONOSCOPY:  Eat a soft diet IF  YOU HAVE NAUSEA, BLOATING, ABDOMINAL PAIN, OR VOMITING.    FINDING OUT THE RESULTS OF YOUR TEST Not all test results are available during your visit. DR. Oneida Alar WILL CALL YOU WITHIN 7 DAYS OF YOUR PROCEDUE WITH YOUR RESULTS. Do not assume everything is normal if you have not heard from DR. FIELDS IN ONE WEEK, CALL HER OFFICE AT [phone].  SEEK IMMEDIATE MEDICAL ATTENTION AND CALL THE OFFICE: [phone] IF:  You have more than a spotting of blood in your stool.   Your belly is swollen (abdominal distention).   You are nauseated or vomiting.   You have a temperature over 101F.   You have abdominal pain or discomfort that is severe or gets worse throughout the day.   Constipation in Adults Constipation is having fewer than 2 bowel movements per week. Usually, the stools are hard. As we grow older, constipation is more common. If you try to fix constipation with laxatives, the problem may get worse. This is because laxatives taken over a long period of time make the colon muscles weaker. A low-fiber diet, not taking in enough fluids, and taking some medicines may make these problems worse.  HOME CARE INSTRUCTIONS  Constipation is usually best cared for without medicines. Increasing dietary fiber and eating more fruits and vegetables is the best way to manage constipation.   Slowly increase fiber intake to 25 to 38 grams per day. Whole grains, fruits, vegetables,  and legumes are good sources of fiber. A dietitian can further help you incorporate high-fiber foods into your diet.   Drink enough water and fluids to keep your urine clear or pale yellow.   A fiber supplement may be added to your diet if you cannot get enough fiber from foods.   Increasing your activities also helps improve regularity.   Stronger measures, such as magnesium sulfate, should be avoided if possible. This may cause uncontrollable diarrhea. Using magnesium sulfate may not allow you time to make it to the  bathroom.    High-Fiber Diet A high-fiber diet changes your normal diet to include more whole grains, legumes, fruits, and vegetables. Changes in the diet involve replacing refined carbohydrates with unrefined foods. The calorie level of the diet is essentially unchanged. The Dietary Reference Intake (recommended amount) for adult males is 38 grams per day. For adult females, it is 25 grams per day. Pregnant and lactating women should consume 28 grams of fiber per day. Fiber is the intact part of a plant that is not broken down during digestion. Functional fiber is fiber that has been isolated from the plant to provide a beneficial effect in the body. PURPOSE  Increase stool bulk.   Ease and regulate bowel movements.   Lower cholesterol.   REDUCE RISK OF COLON CANCER  INDICATIONS THAT YOU NEED MORE FIBER  Constipation and hemorrhoids.   Uncomplicated diverticulosis (intestine condition) and irritable bowel syndrome.   Weight management.   As a protective measure against hardening of the arteries (atherosclerosis), diabetes, and cancer.   GUIDELINES FOR INCREASING FIBER IN THE DIET  Start adding fiber to the diet slowly. A gradual increase of about 5 more grams (2 slices of whole-wheat bread, 2 servings of most fruits or vegetables, or 1 bowl of high-fiber cereal) per day is best. Too rapid an increase in fiber may result in constipation, flatulence, and bloating.   Drink enough water and fluids to keep your urine clear or pale yellow. Water, juice, or caffeine-free drinks are recommended. Not drinking enough fluid may cause constipation.   Eat a variety of high-fiber foods rather than one type of fiber.   Try to increase your intake of fiber through using high-fiber foods rather than fiber pills or supplements that contain small amounts of fiber.   The goal is to change the types of food eaten. Do not supplement your present diet with high-fiber foods, but replace foods in your  present diet.    INCLUDE A VARIETY OF FIBER SOURCES  Replace refined and processed grains with whole grains, canned fruits with fresh fruits, and incorporate other fiber sources. White rice, white breads, and most bakery goods contain little or no fiber.   Brown whole-grain rice, buckwheat oats, and many fruits and vegetables are all good sources of fiber. These include: broccoli, Brussels sprouts, cabbage, cauliflower, beets, sweet potatoes, white potatoes (skin on), carrots, tomatoes, eggplant, squash, berries, fresh fruits, and dried fruits.   Cereals appear to be the richest source of fiber. Cereal fiber is found in whole grains and bran. Bran is the fiber-rich outer coat of cereal grain, which is largely removed in refining. In whole-grain cereals, the bran remains. In breakfast cereals, the largest amount of fiber is found in those with "bran" in their names. The fiber content is sometimes indicated on the label.   You may need to include additional fruits and vegetables each day.   In baking, for 1 cup white flour, you may use  the following substitutions:   1 cup whole-wheat flour minus 2 tablespoons.   1/2 cup white flour plus 1/2 cup whole-wheat flour.   Diverticulosis Diverticulosis is a common condition that develops when small pouches (diverticula) form in the wall of the colon. The risk of diverticulosis increases with age. It happens more often in people who eat a low-fiber diet. Most individuals with diverticulosis have no symptoms. Those individuals with symptoms usually experience belly (abdominal) pain, constipation, or loose stools (diarrhea).  HOME CARE INSTRUCTIONS  Increase the amount of fiber in your diet as directed by your caregiver or dietician. This may reduce symptoms of diverticulosis.   Drink at least 6 to 8 glasses of water each day to prevent constipation.   Try not to strain when you have a bowel movement.   THERE IS NO NEED TO Avoid nuts and seeds to  prevent complications.   FOODS HAVING HIGH FIBER CONTENT INCLUDE:  Fruits. Apple, peach, pear, tangerine, raisins, prunes.   Vegetables. Brussels sprouts, asparagus, broccoli, cabbage, carrot, cauliflower, romaine lettuce, spinach, summer squash, tomato, winter squash, zucchini.   Starchy Vegetables. Baked beans, kidney beans, lima beans, split peas, lentils, potatoes (with skin).   Grains. Whole wheat bread, brown rice, bran flake cereal, plain oatmeal, white rice, shredded wheat, bran muffins.    Monitored Anesthesia Care Anesthesia is a term that refers to techniques, procedures, and medicines that help a person stay safe and comfortable during a medical procedure. Monitored anesthesia care, or sedation, is one type of anesthesia. Your anesthesia specialist may recommend sedation if you will be having a procedure that does not require you to be unconscious, such as:  Cataract surgery.  A dental procedure.  A biopsy.  A colonoscopy.  During the procedure, you may receive a medicine to help you relax (sedative). There are three levels of sedation:  Mild sedation. At this level, you may feel awake and relaxed. You will be able to follow directions.  Moderate sedation. At this level, you will be sleepy. You may not remember the procedure.  Deep sedation. At this level, you will be asleep. You will not remember the procedure.  The more medicine you are given, the deeper your level of sedation will be. Depending on how you respond to the procedure, the anesthesia specialist may change your level of sedation or the type of anesthesia to fit your needs. An anesthesia specialist will monitor you closely during the procedure. Let your health care provider know about:  Any allergies you have.  All medicines you are taking, including vitamins, herbs, eye drops, creams, and over-the-counter medicines.  Any use of steroids (by mouth or as a cream).  Any problems you or family members  have had with sedatives and anesthetic medicines.  Any blood disorders you have.  Any surgeries you have had.  Any medical conditions you have, such as sleep apnea.  Whether you are pregnant or may be pregnant.  Any use of cigarettes, alcohol, or street drugs. What are the risks? Generally, this is a safe procedure. However, problems may occur, including:  Getting too much medicine (oversedation).  Nausea.  Allergic reaction to medicines.  Trouble breathing. If this happens, a breathing tube may be used to help with breathing. It will be removed when you are awake and breathing on your own.  Heart trouble.  Lung trouble.  Before the procedure Staying hydrated Follow instructions from your health care provider about hydration, which may include:  Up to 2 hours before the  procedure - you may continue to drink clear liquids, such as water, clear fruit juice, black coffee, and plain tea.  Eating and drinking restrictions Follow instructions from your health care provider about eating and drinking, which may include:  8 hours before the procedure - stop eating heavy meals or foods such as meat, fried foods, or fatty foods.  6 hours before the procedure - stop eating light meals or foods, such as toast or cereal.  6 hours before the procedure - stop drinking milk or drinks that contain milk.  2 hours before the procedure - stop drinking clear liquids.  Medicines Ask your health care provider about:  Changing or stopping your regular medicines. This is especially important if you are taking diabetes medicines or blood thinners.  Taking medicines such as aspirin and ibuprofen. These medicines can thin your blood. Do not take these medicines before your procedure if your health care provider instructs you not to.  Tests and exams  You will have a physical exam.  You may have blood tests done to show: ? How well your kidneys and liver are working. ? How well your blood  can clot.  General instructions  Plan to have someone take you home from the hospital or clinic.  If you will be going home right after the procedure, plan to have someone with you for 24 hours.  What happens during the procedure?  Your blood pressure, heart rate, breathing, level of pain and overall condition will be monitored.  An IV tube will be inserted into one of your veins.  Your anesthesia specialist will give you medicines as needed to keep you comfortable during the procedure. This may mean changing the level of sedation.  The procedure will be performed. After the procedure  Your blood pressure, heart rate, breathing rate, and blood oxygen level will be monitored until the medicines you were given have worn off.  Do not drive for 24 hours if you received a sedative.  You may: ? Feel sleepy, clumsy, or nauseous. ? Feel forgetful about what happened after the procedure. ? Have a sore throat if you had a breathing tube during the procedure. ? Vomit. This information is not intended to replace advice given to you by your health care provider. Make sure you discuss any questions you have with your health care provider. Document Released: 07/10/2005 Document Revised: 03/22/2016 Document Reviewed: 02/04/2016 Elsevier Interactive Patient Education  [employer].

## 2018-08-25 NOTE — H&P (Signed)
Primary Care Physician:  Celene Squibb, MD Primary Gastroenterologist:  Dr. Oneida Alar  Pre-Procedure History & Physical: HPI:  Sara Barnett is a 52 y.o. female here for [place].  Past Medical History:  Diagnosis Date  . Abnormally small mouth   . Anxiety   . Arthritis    knees  . Chronic ethmoidal sinusitis 07/2017  . Chronic maxillary sinusitis 07/2017  . Complication of anesthesia    anaphylaxis after Methylprednisolone injection - cardiac arrest/PEA  . 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 Constipation   . Depression   . Deviated nasal septum 07/2017  . GERD (gastroesophageal reflux disease)   . History of cardiac murmur    states no known problems, no cardiologist  . Hypertension    states under control with meds., has been on med. x 17 years  . Legally blind   . Nasal turbinate hypertrophy 07/2017  . Rash of back 08/18/2017  . Restless leg     Past Surgical History:  Procedure Laterality Date  . ABLATION ON ENDOMETRIOSIS    . BUNIONECTOMY Right   . CATARACT EXTRACTION, BILATERAL     age 69 year  . CESAREAN SECTION     x 2  . ENDOSCOPIC CONCHA BULLOSA RESECTION Bilateral 08/25/2017   Procedure: BILATERAL ENDOSCOPIC CONCHA BULLOSA RESECTION;  Surgeon: Leta Baptist, MD;  Location: [facility];  Service: ENT;  Laterality: Bilateral;  . ETHMOIDECTOMY Bilateral 08/25/2017   Procedure: BILATERAL TOTAL ETHMOIDECTOMY;  Surgeon: Leta Baptist, MD;  Location: [facility];  Service: ENT;  Laterality: Bilateral;  . EYE SURGERY    . INTRAOCULAR LENS INSERTION Bilateral    age 66s  . LUMBAR FUSION  07/05/2004  . LUMBAR LAMINECTOMY  07/05/2004   L5-S1  . MAXILLARY ANTROSTOMY Bilateral 08/25/2017   Procedure: BILATERAL MAXILLARY ANTROSTOMY WITH TISSUE REMOVAL;  Surgeon: Leta Baptist, MD;  Location: [place];  Service: ENT;  Laterality: Bilateral;  . MEMBRANE PEEL Right 11/06/2011; 08/06/2013  . NASAL SEPTOPLASTY W/  TURBINOPLASTY Bilateral 08/25/2017   Procedure: NASAL SEPTOPLASTY WITH BILATERAL TURBINATE REDUCTION;  Surgeon: Leta Baptist, MD;  Location: [place];  Service: ENT;  Laterality: Bilateral;  . PARS PLANA VITRECTOMY Right 03/22/2011; 08/21/2011; 12/20/2011; 05/13/2012; 11/20/2012; 05/12/2013; 08/06/2013; 05/17/2016  . PARS PLANA VITRECTOMY W/ SCLERAL BUCKLE Left 04/21/2015  . PLANTAR FASCIA RELEASE Right   . SINUS ENDO W/FUSION Bilateral 08/25/2017   Procedure: ENDOSCOPIC SINUS SURGERY WITH FUSION NAVIGATION;  Surgeon: Leta Baptist, MD;  Location: [facility];  Service: ENT;  Laterality: Bilateral;  . TUBAL LIGATION      Prior to Admission medications   Medication Sig Start Date End Date Taking? Authorizing Provider  amLODipine-benazepril (LOTREL) 10-40 MG per capsule Take 1 capsule by mouth daily.   Yes [provider]  Cholecalciferol (VITAMIN D3) 2000 units TABS Take 2,000 Units by mouth daily.   Yes [provider]  clonazePAM (KLONOPIN) 0.5 MG tablet Take 0.5 mg by mouth 2 (two) times daily as needed for anxiety.   Yes [provider]  Ferrous Gluconate (IRON) 240 (27 Fe) MG TABS Take 240 mg by mouth daily.   Yes [provider]  fexofenadine-pseudoephedrine (ALLEGRA-D) 60-120 MG 12 hr tablet Take 1 tablet by mouth daily as needed (for allergies).   Yes [provider]  fluticasone (FLONASE) 50 MCG/ACT nasal spray Place 2 sprays into both nostrils daily as needed for allergies.  02/24/18  Yes [provider]  guaiFENesin (MUCINEX) 600 MG 12 hr tablet Take 600 mg by mouth daily as needed for cough.    Yes [provider]  lansoprazole (PREVACID) 30 MG capsule Take 30 mg by mouth daily.    Yes [provider]  linaclotide Rolan Lipa) 290 MCG CAPS capsule Take 1 capsule (290 mcg total) by mouth daily before breakfast. 01/23/18  Yes Annitta Needs, NP  Multiple Vitamin (MULTIVITAMIN) tablet Take 1 tablet by mouth  daily.   Yes [provider]  Omega-3 Fatty Acids (SALMON OIL-1000 PO) Take 2,000 mg by mouth daily.   Yes [provider]  OVER THE COUNTER MEDICATION Take 2 tablets by mouth daily. Hair Growth Supplement   Yes [provider]  phentermine (ADIPEX-P) 37.5 MG tablet Take 37.5 mg by mouth daily.  03/09/18  Yes [provider]  pramipexole (MIRAPEX) 0.125 MG tablet Take 3 tablets each evening. Take 90-120 min before bedtime. Patient taking differently: Take 0.375 mg by mouth at bedtime.  06/26/18  Yes Dennie Bible, NP  sulfamethoxazole-trimethoprim (BACTRIM DS,SEPTRA DS) 800-160 MG tablet Take 1 tablet by mouth 2 (two) times daily.   Yes [provider]  vitamin C (ASCORBIC ACID) 500 MG tablet Take 500 mg by mouth daily.   Yes [provider]    Allergies as of 07/09/2018 - Review Complete 07/09/2018  Allergen Reaction Noted  . Methylprednisolone Anaphylaxis 12/13/2015  . Augmentin [amoxicillin-pot clavulanate] Other (See Comments) 08/18/2017  . Doxycycline Itching 12/18/2011  . Hydrocodone-acetaminophen Itching and Other (See Comments) 12/13/2015  . Statins Other (See Comments) 12/18/2011    Family History  Problem Relation Age of Onset  . Depression Mother   . Cancer Father        prostate  . Hypertension Father   . Hearing loss Father   . Other Father        dialysis  . Vision loss Daughter        cataracts  . Stroke Maternal Grandmother   . Other Paternal Grandmother        lung issues  . Vision loss Daughter        cataracts  . Colon cancer Neg Hx   . Colon polyps Neg Hx     Social History   Socioeconomic History  . Marital status: Divorced    Spouse name: Not on file  . Number of children: Not on file  . Years of education: Not on file  . Highest education level: Not on file  Occupational History  . Not on file  Social Needs  . Financial resource strain: Not on file  . Food insecurity:    Worry: Not  on file    Inability: Not on file  . Transportation needs:    Medical: Not on file    Non-medical: Not on file  Tobacco Use  . Smoking status: Former Smoker    Packs/day: 3.00    Years: 15.00    Pack years: 45.00    Types: Cigarettes    Last attempt to quit: 05/24/2009    Years since quitting: 9.2  . Smokeless tobacco: Never Used  Substance and Sexual Activity  . Alcohol use: Yes    Comment: occasionally  . Drug use: No  . Sexual activity: Not Currently    Birth control/protection: None, Surgical    Comment: tubal and ablation  Lifestyle  . Physical activity:    Days per week: Not on file    Minutes per session: Not on file  .  Stress: Not on file  Relationships  . Social connections:    Talks on phone: Not on file    Gets together: Not on file    Attends religious service: Not on file    Active member of club or organization: Not on file    Attends meetings of clubs or organizations: Not on file    Relationship status: Not on file  . Intimate partner violence:    Fear of current or ex partner: Not on file    Emotionally abused: Not on file    Physically abused: Not on file    Forced sexual activity: Not on file  Other Topics Concern  . Not on file  Social History Narrative  . Not on file    Review of Systems: See HPI, otherwise negative ROS   Physical Exam: BP 104/71   Pulse 78   Temp 98 F (36.7 C) (Oral)   Resp 18   LMP 07/28/2018 (Approximate)   SpO2 98%  General:   Alert,  pleasant and cooperative in NAD Head:  Normocephalic and atraumatic. Neck:  Supple; Lungs:  Clear throughout to auscultation.    Heart:  Regular rate and rhythm. Abdomen:  Soft, nontender and nondistended. Normal bowel sounds, without guarding, and without rebound.   Neurologic:  Alert and  oriented x4;  grossly normal neurologically.  Impression/Plan:    SCREENING  Plan:  1. TCS TODAY DISCUSSED PROCEDURE, BENEFITS, & RISKS: < 1% chance of medication reaction, bleeding,  perforation, or rupture of spleen/liver.

## 2018-08-26 NOTE — Telephone Encounter (Signed)
PLEASE CALL PT.  SHE SHOULD SEE SURGERY TO FIX HER HEMORRHOIDS.

## 2018-08-26 NOTE — Telephone Encounter (Signed)
Pt is aware, said she will just use the [facility] for now and if she needs referral she will let us know.

## 2018-08-27 ENCOUNTER — Encounter: Payer: PPO | Admitting: Gastroenterology

## 2018-08-27 DIAGNOSIS — Z8669 Personal history of other diseases of the nervous system and sense organs: Secondary | ICD-10-CM | POA: Diagnosis not present

## 2018-08-27 DIAGNOSIS — H3521 Other non-diabetic proliferative retinopathy, right eye: Secondary | ICD-10-CM | POA: Diagnosis not present

## 2018-08-27 DIAGNOSIS — H15012 Anterior scleritis, left eye: Secondary | ICD-10-CM | POA: Diagnosis not present

## 2018-08-27 DIAGNOSIS — H26491 Other secondary cataract, right eye: Secondary | ICD-10-CM | POA: Diagnosis not present

## 2018-08-27 DIAGNOSIS — H33021 Retinal detachment with multiple breaks, right eye: Secondary | ICD-10-CM | POA: Diagnosis not present

## 2018-08-28 ENCOUNTER — Encounter (HOSPITAL_COMMUNITY): Payer: Self-pay | Admitting: Gastroenterology

## 2018-09-01 DIAGNOSIS — M778 Other enthesopathies, not elsewhere classified: Secondary | ICD-10-CM | POA: Diagnosis not present

## 2018-09-01 DIAGNOSIS — M79671 Pain in right foot: Secondary | ICD-10-CM | POA: Diagnosis not present

## 2018-09-11 ENCOUNTER — Other Ambulatory Visit: Payer: Self-pay

## 2018-09-11 ENCOUNTER — Encounter (HOSPITAL_COMMUNITY): Payer: Self-pay | Admitting: Emergency Medicine

## 2018-09-11 ENCOUNTER — Emergency Department (HOSPITAL_COMMUNITY)
Admission: EM | Admit: 2018-09-11 | Discharge: 2018-09-12 | Disposition: A | Discharge status: Home / Self Care | Payer: PPO | Attending: Emergency Medicine | Admitting: Emergency Medicine

## 2018-09-11 DIAGNOSIS — F411 Generalized anxiety disorder: Secondary | ICD-10-CM | POA: Insufficient documentation

## 2018-09-11 DIAGNOSIS — F329 Major depressive disorder, single episode, unspecified: Secondary | ICD-10-CM | POA: Diagnosis present

## 2018-09-11 DIAGNOSIS — Z87891 Personal history of nicotine dependence: Secondary | ICD-10-CM | POA: Diagnosis not present

## 2018-09-11 DIAGNOSIS — Z79899 Other long term (current) drug therapy: Secondary | ICD-10-CM | POA: Insufficient documentation

## 2018-09-11 DIAGNOSIS — R45851 Suicidal ideations: Secondary | ICD-10-CM | POA: Diagnosis not present

## 2018-09-11 DIAGNOSIS — F331 Major depressive disorder, recurrent, moderate: Secondary | ICD-10-CM | POA: Diagnosis not present

## 2018-09-11 DIAGNOSIS — E876 Hypokalemia: Secondary | ICD-10-CM | POA: Diagnosis not present

## 2018-09-11 DIAGNOSIS — F32A Depression, unspecified: Secondary | ICD-10-CM

## 2018-09-11 DIAGNOSIS — I10 Essential (primary) hypertension: Secondary | ICD-10-CM | POA: Insufficient documentation

## 2018-09-11 LAB — I-STAT BETA HCG BLOOD, ED (MC, WL, AP ONLY): I-stat hCG, quantitative: 17.2 m[IU]/mL — ABNORMAL HIGH (ref <5)

## 2018-09-11 LAB — CBC WITH DIFFERENTIAL/PLATELET
ABS IMMATURE GRANULOCYTES: 0.02 10*3/uL (ref 0.00–0.07)
Basophils Absolute: 0.1 10*3/uL (ref 0.0–0.1)
Basophils Relative: 1 %
Eosinophils Absolute: 0.1 10*3/uL (ref 0.0–0.5)
Eosinophils Relative: 2 %
HEMATOCRIT: 44.7 % (ref 36.0–46.0)
HEMOGLOBIN: 14.9 g/dL (ref 12.0–15.0)
IMMATURE GRANULOCYTES: 0 %
LYMPHS PCT: 30 %
Lymphs Abs: 1.6 10*3/uL (ref 0.7–4.0)
MCH: 30 pg (ref 26.0–34.0)
MCHC: 33.3 g/dL (ref 30.0–36.0)
MCV: 90.1 fL (ref 80.0–100.0)
MONOS PCT: 7 %
Monocytes Absolute: 0.4 10*3/uL (ref 0.1–1.0)
NEUTROS ABS: 3.1 10*3/uL (ref 1.7–7.7)
NEUTROS PCT: 60 %
Platelets: 367 10*3/uL (ref 150–400)
RBC: 4.96 MIL/uL (ref 3.87–5.11)
RDW: 12.5 % (ref 11.5–15.5)
WBC: 5.2 10*3/uL (ref 4.0–10.5)
nRBC: 0 % (ref 0.0–0.2)

## 2018-09-11 LAB — COMPREHENSIVE METABOLIC PANEL
ALT: 21 U/L (ref 0–44)
AST: 19 U/L (ref 15–41)
Albumin: 3.5 g/dL (ref 3.5–5.0)
Alkaline Phosphatase: 65 U/L (ref 38–126)
Anion gap: 13 (ref 5–15)
BILIRUBIN TOTAL: 0.7 mg/dL (ref 0.3–1.2)
BUN: 12 mg/dL (ref 6–20)
CHLORIDE: 106 mmol/L (ref 98–111)
CO2: 21 mmol/L — ABNORMAL LOW (ref 22–32)
Calcium: 9.2 mg/dL (ref 8.9–10.3)
Creatinine, Ser: 0.74 mg/dL (ref 0.44–1.00)
GFR calc Af Amer: >60 mL/min (ref >60)
GFR calc non Af Amer: >60 mL/min (ref >60)
Glucose, Bld: 120 mg/dL — ABNORMAL HIGH (ref 70–99)
POTASSIUM: 2.9 mmol/L — AB (ref 3.5–5.1)
Sodium: 140 mmol/L (ref 135–145)
TOTAL PROTEIN: 7 g/dL (ref 6.5–8.1)

## 2018-09-11 LAB — SALICYLATE LEVEL

## 2018-09-11 LAB — ETHANOL: Alcohol, Ethyl (B): 66 mg/dL — ABNORMAL HIGH (ref <10)

## 2018-09-11 LAB — ACETAMINOPHEN LEVEL: ACETAMINOPHEN (TYLENOL), SERUM: 16 ug/mL (ref 10–30)

## 2018-09-11 MED ORDER — POTASSIUM CHLORIDE CRYS ER 20 MEQ PO TBCR
40.0000 meq | EXTENDED_RELEASE_TABLET | Freq: Once | ORAL | Status: AC
  Administered 2018-09-12: 40 meq via ORAL
  Filled 2018-09-11: qty 2

## 2018-09-11 NOTE — ED Notes (Signed)
Call Karn Cassis (daughter) at [phone] for transportation at discharge.

## 2018-09-11 NOTE — ED Notes (Signed)
Patient has been placed in scrubs and wanded by security.

## 2018-09-11 NOTE — ED Triage Notes (Signed)
Pt here by [facility] after rcving a ph call from her child's father that he rcvd text messages from the pt stating she wants to die, they were dispatched for a welfare ck, upon arrival pt was asleep, pt admits she had been drinking wine tonight, when asked about SI/HI pt stated "I don't want to kill myself, I just want to die in my sleep", during triage pt states she did send text messages but spoke to child's father on the phone, during triage pt denies SI/HI, admits to depression but states "I don't want to die, I just want to be happier, it's the holidays, I don't have any money", pt calm and cooperative during triage

## 2018-09-11 NOTE — ED Provider Notes (Signed)
[facility] EMERGENCY DEPARTMENT Provider Note   CSN: [id number] Arrival date & time: 09/11/18  2144     History   Chief Complaint Chief Complaint  Patient presents with  . V70.1    HPI Sara Barnett is a 52 y.o. female.  HPI Patient presents to the emergency room for evaluation of depression.  Patient has been upset recently got her finances in the upcoming holidays.  She was also upset her dogs this evening.  Patient admits to drinking wine.  She sent texts to the father of her children that concerned him.  Patient admits to thinking about being happy if she were just not to wake up but not actually thinking about trying to kill herself.  [employer] arrived to the patient's house to do a welfare check.  They found her sleeping.  Patient admits to drinking but denies ingesting any pills.  Does not have any plans to harm herself.  Patient really wants to just go home. Past Medical History:  Diagnosis Date  . Abnormally small mouth   . Anxiety   . Arthritis    knees  . Chronic ethmoidal sinusitis 07/2017  . Chronic maxillary sinusitis 07/2017  . Complication of anesthesia    anaphylaxis after Methylprednisolone injection - cardiac arrest/PEA  . 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 Constipation   . Depression   . Deviated nasal septum 07/2017  . GERD (gastroesophageal reflux disease)   . History of cardiac murmur    states no known problems, no cardiologist  . Hypertension    states under control with meds., has been on med. x 17 years  . Legally blind   . Nasal turbinate hypertrophy 07/2017  . Rash of back 08/18/2017  . Restless leg     Patient Active Problem List   Diagnosis Date Noted  . Encounter for screening colonoscopy 07/09/2018  . RLS (restless legs syndrome) 03/16/2018  . Constipation 01/29/2017  . Breast tenderness 12/13/2015  . Fatigue 12/13/2015  . Hot flashes 12/13/2015  . Major depression 07/20/2014    Past Surgical History:  Procedure  Laterality Date  . ABLATION ON ENDOMETRIOSIS    . BUNIONECTOMY Right   . CATARACT EXTRACTION, BILATERAL     age 36 year  . CESAREAN SECTION     x 2  . COLONOSCOPY WITH PROPOFOL N/A 08/25/2018   Procedure: COLONOSCOPY WITH PROPOFOL;  Surgeon: Danie Binder, MD;  [facility]: [facility];  Service: Endoscopy;  Laterality: N/A;  2:15pm  . ENDOSCOPIC CONCHA BULLOSA RESECTION Bilateral 08/25/2017   Procedure: BILATERAL ENDOSCOPIC CONCHA BULLOSA RESECTION;  Surgeon: Leta Baptist, MD;  Location: [place];  Service: ENT;  Laterality: Bilateral;  . ETHMOIDECTOMY Bilateral 08/25/2017   Procedure: BILATERAL TOTAL ETHMOIDECTOMY;  Surgeon: Leta Baptist, MD;  Location: [place];  Service: ENT;  Laterality: Bilateral;  . EYE SURGERY    . INTRAOCULAR LENS INSERTION Bilateral    age 89s  . LUMBAR FUSION  07/05/2004  . LUMBAR LAMINECTOMY  07/05/2004   L5-S1  . MAXILLARY ANTROSTOMY Bilateral 08/25/2017   Procedure: BILATERAL MAXILLARY ANTROSTOMY WITH TISSUE REMOVAL;  Surgeon: Leta Baptist, MD;  Location: [place];  Service: ENT;  Laterality: Bilateral;  . MEMBRANE PEEL Right 11/06/2011; 08/06/2013  . NASAL SEPTOPLASTY W/ TURBINOPLASTY Bilateral 08/25/2017   Procedure: NASAL SEPTOPLASTY WITH BILATERAL TURBINATE REDUCTION;  Surgeon: Leta Baptist, MD;  Location: [place];  Service: ENT;  Laterality: Bilateral;  . PARS PLANA VITRECTOMY  Right 03/22/2011; 08/21/2011; 12/20/2011; 05/13/2012; 11/20/2012; 05/12/2013; 08/06/2013; 05/17/2016  . PARS PLANA VITRECTOMY W/ SCLERAL BUCKLE Left 04/21/2015  . PLANTAR FASCIA RELEASE Right   . SINUS ENDO W/FUSION Bilateral 08/25/2017   Procedure: ENDOSCOPIC SINUS SURGERY WITH FUSION NAVIGATION;  Surgeon: Leta Baptist, MD;  Location: [place];  Service: ENT;  Laterality: Bilateral;  . TUBAL LIGATION       OB History    Gravida  2   Para  2   Term      Preterm      AB      Living  2     SAB      TAB       Ectopic      Multiple      Live Births               Home Medications    Prior to Admission medications   Medication Sig Start Date End Date Taking? Authorizing Provider  amLODipine-benazepril (LOTREL) 10-40 MG per capsule Take 1 capsule by mouth daily.    [provider]  Cholecalciferol (VITAMIN D3) 2000 units TABS Take 2,000 Units by mouth daily.    [provider]  clonazePAM (KLONOPIN) 0.5 MG tablet Take 0.5 mg by mouth 2 (two) times daily as needed for anxiety.    [provider]  Ferrous Gluconate (IRON) 240 (27 Fe) MG TABS Take 240 mg by mouth daily.    [provider]  fexofenadine-pseudoephedrine (ALLEGRA-D) 60-120 MG 12 hr tablet Take 1 tablet by mouth daily as needed (for allergies).    [provider]  fluticasone (FLONASE) 50 MCG/ACT nasal spray Place 2 sprays into both nostrils daily as needed for allergies.  02/24/18   [provider]  guaiFENesin (MUCINEX) 600 MG 12 hr tablet Take 600 mg by mouth daily as needed for cough.     [provider]  lansoprazole (PREVACID) 30 MG capsule Take 30 mg by mouth daily.     [provider]  linaclotide Rolan Lipa) 290 MCG CAPS capsule Take 1 capsule (290 mcg total) by mouth daily before breakfast. 01/23/18   Annitta Needs, NP  Multiple Vitamin (MULTIVITAMIN) tablet Take 1 tablet by mouth daily.    [provider]  Omega-3 Fatty Acids (SALMON OIL-1000 PO) Take 2,000 mg by mouth daily.    [provider]  OVER THE COUNTER MEDICATION Take 2 tablets by mouth daily. Hair Growth Supplement    [provider]  phentermine (ADIPEX-P) 37.5 MG tablet Take 37.5 mg by mouth daily.  03/09/18   [provider]  pramipexole (MIRAPEX) 0.125 MG tablet Take 3 tablets each evening. Take 90-120 min before bedtime. Patient taking differently: Take 0.375 mg by mouth at bedtime.  06/26/18   Dennie Bible, NP  sulfamethoxazole-trimethoprim  (BACTRIM DS,SEPTRA DS) 800-160 MG tablet Take 1 tablet by mouth 2 (two) times daily.    [provider]  vitamin C (ASCORBIC ACID) 500 MG tablet Take 500 mg by mouth daily.    [provider]    Family History Family History  Problem Relation Age of Onset  . Depression Mother   . Cancer Father        prostate  . Hypertension Father   . Hearing loss Father   . Other Father        dialysis  . Vision loss Daughter        cataracts  . Stroke Maternal Grandmother   . Other  Paternal Grandmother        lung issues  . Vision loss Daughter        cataracts  . Colon cancer Neg Hx   . Colon polyps Neg Hx     Social History Social History   Tobacco Use  . Smoking status: Former Smoker    Packs/day: 3.00    Years: 15.00    Pack years: 45.00    Types: Cigarettes    Last attempt to quit: 05/24/2009    Years since quitting: 9.3  . Smokeless tobacco: Never Used  Substance Use Topics  . Alcohol use: Yes    Comment: occasionally  . Drug use: No     Allergies   Methylprednisolone; Augmentin [amoxicillin-pot clavulanate]; Doxycycline; Hydrocodone-acetaminophen; Statins; and Other   Review of Systems Review of Systems  All other systems reviewed and are negative.    Physical Exam Updated Vital Signs BP (!) 140/101 (BP Location: Right Arm)   Pulse (!) 110   Temp 97.9 F (36.6 C) (Oral)   Resp 18   Ht 1.6 m (5\' 3" )   Wt 72.6 kg   LMP 08/11/2018   SpO2 95%   BMI 28.34 kg/m   Physical Exam  Constitutional: She appears well-developed and well-nourished. No distress.  HENT:  Head: Normocephalic and atraumatic.  Right Ear: External ear normal.  Left Ear: External ear normal.  Eyes: Conjunctivae are normal. Right eye exhibits no discharge. Left eye exhibits no discharge. No scleral icterus.  Neck: Neck supple. No tracheal deviation present.  Cardiovascular: Normal rate, regular rhythm and intact distal pulses.  Pulmonary/Chest: Effort normal and breath  sounds normal. No stridor. No respiratory distress. She has no wheezes. She has no rales.  Abdominal: Soft. Bowel sounds are normal. She exhibits no distension. There is no tenderness. There is no rebound and no guarding.  Musculoskeletal: She exhibits no edema or tenderness.  Neurological: She is alert. She has normal strength. No cranial nerve deficit (no facial droop, extraocular movements intact, no slurred speech) or sensory deficit. She exhibits normal muscle tone. She displays no seizure activity. Coordination normal.  Skin: Skin is warm and dry. No rash noted.  Psychiatric: Her speech is not slurred. She is not aggressive and not hyperactive. She exhibits a depressed mood. She expresses no suicidal plans and no homicidal plans. She is communicative.  Nursing note and vitals reviewed.    ED Treatments / Results  Labs (all labs ordered are listed, but only abnormal results are displayed) Labs Reviewed  COMPREHENSIVE METABOLIC PANEL - Abnormal; Notable for the following components:      Result Value   Potassium 2.9 (*)    CO2 21 (*)    Glucose, Bld 120 (*)    All other components within normal limits  ETHANOL - Abnormal; Notable for the following components:   Alcohol, Ethyl (B) 66 (*)    All other components within normal limits  I-STAT BETA HCG BLOOD, ED ([facility], [facility], [facility] ONLY) - Abnormal; Notable for the following components:   I-stat hCG, quantitative 17.2 (*)    All other components within normal limits  CBC WITH DIFFERENTIAL/PLATELET  SALICYLATE LEVEL  ACETAMINOPHEN LEVEL  RAPID URINE DRUG SCREEN, HOSP PERFORMED    EKG None  Radiology No results found.  Procedures Procedures (including critical care time)  Medications Ordered in ED Medications  potassium chloride SA (K-DUR,KLOR-CON) CR tablet 40 mEq (has no administration in time range)     Initial Impression / Assessment and Plan /  ED Course  I have reviewed the triage vital signs and the nursing  notes.  Pertinent labs & imaging results that were available during my care of the patient were reviewed by me and considered in my medical decision making (see chart for details).  Clinical Course as of Sep 11 2348  Fri Sep 11, 2018  2348 Hcg 17.   Doubt clinically pregnant    [JK]  2348 Potassium low.  Will replace   [JK]  2348 Pt is medically cleared for psych evaluation   [JK]    Clinical Course User Index [JK] Dorie Rank, MD    Pt is medically stable.  Dispo pending psych assessment.  Final Clinical Impressions(s) / ED Diagnoses   Final diagnoses:  Depression, unspecified depression type      Dorie Rank, MD 09/11/18 2349

## 2018-09-11 NOTE — ED Notes (Signed)
Patient has sitter at bedside at this time. Reminded patient that urine specimen is needed when she goes to restroom. Patient states that she does not have to go at this time but understands.

## 2018-09-12 DIAGNOSIS — F329 Major depressive disorder, single episode, unspecified: Secondary | ICD-10-CM

## 2018-09-12 DIAGNOSIS — E876 Hypokalemia: Secondary | ICD-10-CM | POA: Diagnosis not present

## 2018-09-12 LAB — I-STAT BETA HCG BLOOD, ED (MC, WL, AP ONLY): HCG, QUANTITATIVE: 16 m[IU]/mL — AB (ref <5)

## 2018-09-12 LAB — RAPID URINE DRUG SCREEN, HOSP PERFORMED
AMPHETAMINES: NOT DETECTED
BARBITURATES: NOT DETECTED
Benzodiazepines: NOT DETECTED
Cocaine: NOT DETECTED
Opiates: NOT DETECTED
Tetrahydrocannabinol: NOT DETECTED

## 2018-09-12 LAB — ACETAMINOPHEN LEVEL: ACETAMINOPHEN (TYLENOL), SERUM: 15 ug/mL (ref 10–30)

## 2018-09-12 MED ORDER — BENAZEPRIL HCL 10 MG PO TABS
40.0000 mg | ORAL_TABLET | Freq: Every day | ORAL | Status: DC
  Filled 2018-09-12 (×3): qty 1

## 2018-09-12 MED ORDER — CLONAZEPAM 0.5 MG PO TABS
0.5000 mg | ORAL_TABLET | Freq: Two times a day (BID) | ORAL | Status: DC | PRN
  Administered 2018-09-12: 0.5 mg via ORAL
  Filled 2018-09-12 (×2): qty 1

## 2018-09-12 MED ORDER — PANTOPRAZOLE SODIUM 40 MG PO TBEC
40.0000 mg | DELAYED_RELEASE_TABLET | Freq: Every day | ORAL | Status: DC
  Administered 2018-09-12: 40 mg via ORAL
  Filled 2018-09-12 (×3): qty 1

## 2018-09-12 MED ORDER — AMLODIPINE BESY-BENAZEPRIL HCL 10-40 MG PO CAPS: 1.0000 | ORAL_CAPSULE | Freq: Every day | ORAL | Status: DC

## 2018-09-12 MED ORDER — PRAMIPEXOLE DIHYDROCHLORIDE 0.25 MG PO TABS
0.3750 mg | ORAL_TABLET | Freq: Every day | ORAL | Status: DC
  Administered 2018-09-12: 0.375 mg via ORAL
  Filled 2018-09-12 (×3): qty 1

## 2018-09-12 MED ORDER — PRAMIPEXOLE DIHYDROCHLORIDE 0.25 MG PO TABS
0.3750 mg | ORAL_TABLET | Freq: Every day | ORAL | Status: DC
  Filled 2018-09-12 (×2): qty 1.5

## 2018-09-12 MED ORDER — AMLODIPINE BESYLATE 5 MG PO TABS
10.0000 mg | ORAL_TABLET | Freq: Every day | ORAL | Status: DC
  Filled 2018-09-12: qty 2
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
## 2018-09-12 NOTE — ED Notes (Signed)
Patient on tele psych with [facility].

## 2018-09-12 NOTE — ED Notes (Signed)
Patient refused medication stating that she is going to be discharged.

## 2018-09-12 NOTE — BH Assessment (Addendum)
Tele Assessment Note   Patient Name: Sara Barnett MRN: [id number] Referring Physician: Dorie Rank, MD Location of Patient: [facility], [phone] Location of Provider: [place] is an 52 y.o. divorced female who presents unaccompanied to [facility] voluntarily via Event organiser. Pt reports "I had a bad day", that her dogs got out and rolled in feces, her back was hurting and that she is having financial problems. She says she drank one large glass of wine and "started blabbing my mouth" to the father of her children. She acknowledges she told him that she didn't want to live anymore. Pt's says, "I was exaggerating because I wanted him to know how I hurt inside and wanted to get pity." The father of her children contacted law enforcement to do a wellness check and Pt told officers, "I don't want to kill myself, I just want to die in my sleep."  Pt reports she has history of depression and anxiety. She reports experiencing sleep problems. She says she often feels anxious. She denies any history of suicide attempts stating, "I'm scared of dying, I am too chicken to hurt myself, but I don't want people to know that." She denies current homicidal ideation or history of violence. She denies any history of psychotic symptoms. Pt denies alcohol abuse, stating she goes months without drinking. Pt's blood alcohol level is 66. She denies other substance use.  Pt identifies several stressors. She says she has a history of spinal fusion and experiences chronic back pain. She is on disability and has limited income. Pt says the holidays are coming and she doesn't have any money. Pt says she lives with her two daughter, ages 36 and 33, and says they are both doing well. Pt denies any history of abuse or trauma. She denies legal issues. Pt received outpatient therapy through [facility] in 2016. She has no history of inpatient psychiatric treatment but says  she has been psychiatrically evaluated in the ED before.  Pt is dressed in hospital scrubs, alert and oriented x4. Pt speaks in a clear tone, at moderate volume and normal pace. Motor behavior appears normal. Eye contact is good. Pt's mood is depressed and affect is generally euthymic, with Pt occasionally making jokes. Thought process is coherent and relevant. There is no indication Pt is currently responding to internal stimuli or experiencing delusional thought content. Pt was cooperative and pleasant throughout assessment. She says she wants to return home.   Diagnosis:  F33.1 Major depressive disorder, Recurrent episode, Moderate F41.1 Generalized anxiety disorder  Past Medical History:  Past Medical History:  Diagnosis Date  . Abnormally small mouth   . Anxiety   . Arthritis    knees  . Chronic ethmoidal sinusitis 07/2017  . Chronic maxillary sinusitis 07/2017  . Complication of anesthesia    anaphylaxis after Methylprednisolone injection - cardiac arrest/PEA  . 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 Constipation   . Depression   . Deviated nasal septum 07/2017  . GERD (gastroesophageal reflux disease)   . History of cardiac murmur    states no known problems, no cardiologist  . Hypertension    states under control with meds., has been on med. x 17 years  . Legally blind   . Nasal turbinate hypertrophy 07/2017  . Rash of back 08/18/2017  . Restless leg     Past Surgical History:  Procedure Laterality Date  . ABLATION ON ENDOMETRIOSIS    .  BUNIONECTOMY Right   . CATARACT EXTRACTION, BILATERAL     age 52 year  . CESAREAN SECTION     x 2  . COLONOSCOPY WITH PROPOFOL N/A 08/25/2018   Procedure: COLONOSCOPY WITH PROPOFOL;  Surgeon: Danie Binder, MD;  [facility];  Service: Endoscopy;  Laterality: N/A;  2:15pm  . ENDOSCOPIC CONCHA BULLOSA RESECTION Bilateral 08/25/2017   Procedure: BILATERAL ENDOSCOPIC CONCHA BULLOSA RESECTION;  Surgeon: Leta Baptist, MD;   Location: [place];  Service: ENT;  Laterality: Bilateral;  . ETHMOIDECTOMY Bilateral 08/25/2017   Procedure: BILATERAL TOTAL ETHMOIDECTOMY;  Surgeon: Leta Baptist, MD;  Location: [place];  Service: ENT;  Laterality: Bilateral;  . EYE SURGERY    . INTRAOCULAR LENS INSERTION Bilateral    age 52s  . LUMBAR FUSION  07/05/2004  . LUMBAR LAMINECTOMY  07/05/2004   L5-S1  . MAXILLARY ANTROSTOMY Bilateral 08/25/2017   Procedure: BILATERAL MAXILLARY ANTROSTOMY WITH TISSUE REMOVAL;  Surgeon: Leta Baptist, MD;  Location: [place];  Service: ENT;  Laterality: Bilateral;  . MEMBRANE PEEL Right 11/06/2011; 08/06/2013  . NASAL SEPTOPLASTY W/ TURBINOPLASTY Bilateral 08/25/2017   Procedure: NASAL SEPTOPLASTY WITH BILATERAL TURBINATE REDUCTION;  Surgeon: Leta Baptist, MD;  Location: [place];  Service: ENT;  Laterality: Bilateral;  . PARS PLANA VITRECTOMY Right 03/22/2011; 08/21/2011; 12/20/2011; 05/13/2012; 11/20/2012; 05/12/2013; 08/06/2013; 05/17/2016  . PARS PLANA VITRECTOMY W/ SCLERAL BUCKLE Left 04/21/2015  . PLANTAR FASCIA RELEASE Right   . SINUS ENDO W/FUSION Bilateral 08/25/2017   Procedure: ENDOSCOPIC SINUS SURGERY WITH FUSION NAVIGATION;  Surgeon: Leta Baptist, MD;  Location: [place];  Service: ENT;  Laterality: Bilateral;  . TUBAL LIGATION      Family History:  Family History  Problem Relation Age of Onset  . Depression Mother   . Cancer Father        prostate  . Hypertension Father   . Hearing loss Father   . Other Father        dialysis  . Vision loss Daughter        cataracts  . Stroke Maternal Grandmother   . Other Paternal Grandmother        lung issues  . Vision loss Daughter        cataracts  . Colon cancer Neg Hx   . Colon polyps Neg Hx     Social History:  reports that she quit smoking about 9 years ago. Her smoking use included cigarettes. She has a 45.00 pack-year smoking history. She has never used  smokeless tobacco. She reports that she drinks alcohol. She reports that she does not use drugs.  Additional Social History:  Alcohol / Drug Use Pain Medications: SEE MAR Prescriptions: SEE MAR Over the Counter: SEE MAR History of alcohol / drug use?: No history of alcohol / drug abuse Longest period of sobriety (when/how long): NA  CIWA: CIWA-Ar BP: (!) 140/101 Pulse Rate: (!) 110 COWS:    Allergies:  Allergies  Allergen Reactions  . Methylprednisolone Anaphylaxis and Other (See Comments)    CARDIAC ARREST/PEA   . Augmentin [Amoxicillin-Pot Clavulanate] Other (See Comments)    "FEELING HOT"; EXTREME SLEEPINESS  . Doxycycline Itching and Other (See Comments)    THROAT SWELLING  . Hydrocodone-Acetaminophen Itching and Other (See Comments)    THROAT SWELLING  . Statins Other (See Comments)    JOINT PAIN, swellling  . Other Other (See Comments)    Steroids - Unknown drug and strength, stopped  patient's heart    Home Medications:  (Not in a hospital admission)  OB/GYN Status:  Patient's last menstrual period was 08/11/2018.  General Assessment Data Location of Assessment: [facility] TTS Assessment: In system Is this a Tele or Face-to-Face Assessment?: Tele Assessment Is this an Initial Assessment or a Re-assessment for this encounter?: Initial Assessment Patient Accompanied by:: Other(Alone) Language Other than English: No Living Arrangements: Other (Comment) What gender do you identify as?: Female Marital status: Divorced Maiden name: Roller Pregnancy Status: No Living Arrangements: Children Can pt return to current living arrangement?: Yes Admission Status: Voluntary Is patient capable of signing voluntary admission?: Yes Referral Source: Self/Family/Friend Insurance type: Equities trader     Crisis Care Plan Living Arrangements: Children Legal Guardian: Other:(Self) Name of Psychiatrist: None Name of Therapist: None  Education Status Is patient  currently in school?: No Is the patient employed, unemployed or receiving disability?: Receiving disability income  Risk to self with the past 6 months Suicidal Ideation: Yes-Currently Present Has patient been a risk to self within the past 6 months prior to admission? : No Suicidal Intent: No Has patient had any suicidal intent within the past 6 months prior to admission? : No Is patient at risk for suicide?: No Suicidal Plan?: No Has patient had any suicidal plan within the past 6 months prior to admission? : No Access to Means: No What has been your use of drugs/alcohol within the last 12 months?: Pt denies Previous Attempts/Gestures: No How many times?: 0 Other Self Harm Risks: None Triggers for Past Attempts: None known Intentional Self Injurious Behavior: None Family Suicide History: No Recent stressful life event(s): Financial Problems, Other (Comment)(Chronic pain) Persecutory voices/beliefs?: No Depression: Yes Depression Symptoms: Despondent, Fatigue, Guilt, Feeling worthless/self pity Substance abuse history and/or treatment for substance abuse?: No Suicide prevention information given to non-admitted patients: Not applicable  Risk to Others within the past 6 months Homicidal Ideation: No Does patient have any lifetime risk of violence toward others beyond the six months prior to admission? : No Thoughts of Harm to Others: No Current Homicidal Intent: No Current Homicidal Plan: No Access to Homicidal Means: No Identified Victim: None History of harm to others?: No Assessment of Violence: None Noted Violent Behavior Description: Pt denies history or violence Does patient have access to weapons?: No Criminal Charges Pending?: No Does patient have a court date: No Is patient on probation?: No  Psychosis Hallucinations: None noted Delusions: None noted  Mental Status Report Appearance/Hygiene: In scrubs Eye Contact: Good Motor Activity: Unremarkable Speech:  Logical/coherent Level of Consciousness: Alert Mood: Depressed Affect: Appropriate to circumstance Anxiety Level: Minimal Thought Processes: Coherent, Relevant Judgement: Partial Orientation: Person, Time, Place, Situation Obsessive Compulsive Thoughts/Behaviors: None  Cognitive Functioning Concentration: Normal Memory: Recent Intact, Remote Intact Is patient IDD: No Insight: Fair Impulse Control: Fair Appetite: Good Have you had any weight changes? : No Change Sleep: Decreased Total Hours of Sleep: 6 Vegetative Symptoms: None  ADLScreening [facility] Assessment Services) Patient's cognitive ability adequate to safely complete daily activities?: Yes Patient able to express need for assistance with ADLs?: Yes Independently performs ADLs?: Yes (appropriate for developmental age)  Prior Inpatient Therapy Prior Inpatient Therapy: No  Prior Outpatient Therapy Prior Outpatient Therapy: Yes Prior Therapy Dates: 2016 Prior Therapy Facilty/Provider(s): [facility] Reason for Treatment: Depression, anxiety Does patient have an ACCT team?: No Does patient have Intensive In-House Services?  : No Does patient have [facility] services? : No Does patient have P4CC services?: No  ADL  Screening (condition at time of admission) Patient's cognitive ability adequate to safely complete daily activities?: Yes Is the patient deaf or have difficulty hearing?: No Does the patient have difficulty seeing, even when wearing glasses/contacts?: Yes Does the patient have difficulty concentrating, remembering, or making decisions?: No Patient able to express need for assistance with ADLs?: Yes Does the patient have difficulty dressing or bathing?: No Independently performs ADLs?: Yes (appropriate for developmental age) Does the patient have difficulty walking or climbing stairs?: No Weakness of Legs: None Weakness of Arms/Hands: None       Abuse/Neglect Assessment (Assessment to be  complete while patient is alone) Abuse/Neglect Assessment Can Be Completed: Yes Physical Abuse: Denies Verbal Abuse: Denies Sexual Abuse: Denies Exploitation of patient/patient's resources: Denies Self-Neglect: Denies     Regulatory affairs officer (For Healthcare) Does Patient Have a Medical Advance Directive?: No Would patient like information on creating a medical advance directive?: No - Patient declined          Disposition: Gave clinical report to Patriciaann Clan, PA who recommended Pt be observed overnight and evaluated by psychiatry in the morning due to Pt admitting to making suicidal statements tonight. Notified Dr. Ezequiel Essex and Legrand Como, RN of recommendation.  Disposition Initial Assessment Completed for this Encounter: Yes Patient referred to: Other (Comment)  This service was provided via telemedicine using a 2-way, interactive audio and video technology.  Names of all persons participating in this telemedicine service and their role in this encounter. Name: Sara Barnett Role: Patient  Name: Storm Frisk, [place] Role: TTS counselor         Orpah Greek Anson Fret, [facility], [facility], [facility] Triage Specialist [phone]  Evelena Peat 09/12/2018 1:04 AM

## 2018-09-12 NOTE — ED Provider Notes (Signed)
Patient was seen again this morning by TTS, their note is currently pending.  I talked directly to the TTS provider who feels like the patient can be discharged with outpatient resources.  At this time the patient is alert and cooperative.  She is anxious to go home.  She relates a story about being betrayed by a man that she was with.  She states that around the holiday time, she watches TV movies and these make her sad.  She denies suicidal ideation or plan at this time.  He has a friend to talk to that is supportive and she is willing to talk to a therapist.  Patient will be discharged.   Daleen Bo, MD 09/12/18 1016

## 2018-09-12 NOTE — ED Notes (Signed)
Patient waiting in lobby for daughter to pick her up from hospital.

## 2018-09-12 NOTE — ED Provider Notes (Signed)
TTS recommends overnight observation and evaluation by psychiatry in the morning. Holding orders placed. APAP level is downtrending.   Ezequiel Essex, MD 09/12/18 0157

## 2018-09-12 NOTE — Discharge Instructions (Signed)
Please follow-up with a counselor to help with depressive symptoms and ideas.  If you begin to feel suicidal again please return to the emergency department.  Your potassium was a little bit low today and can improve by increasing the potassium content of your dietary intake.  Follow-up with your primary care doctor for checkup in 1 or 2 weeks.

## 2018-09-12 NOTE — ED Notes (Signed)
Patient states that she is hungry at this time. Patient given sandwich, chips and Sprite Zero. Patient is very anxious and upset that she is not able to go home at this time.

## 2018-09-21 ENCOUNTER — Other Ambulatory Visit: Payer: Self-pay | Admitting: *Deleted

## 2018-09-21 NOTE — Patient Outreach (Signed)
[facility]) Care Management  09/21/2018  Sara Barnett May 21, 1966 [id number]    CSW received referral from [facility], Orlin Hilding that patient was recently hospitalized due to suicidal ideation. CSW called & spoke with patient, provided supportive listening. Patient spoke about issues with her ex-boyfriend and finding out he was married but states that she has been talking with family and friends and has been doing well since returning home from the hospital. Patient reports that she did receive a list of outpatient counseling but at this time does not feel the need for counseling as she receives support from her family. Patient agreed to having CSW check back in with her in a few weeks and encouraged her to call if any concerns came up in the meantime.    Raynaldo Opitz, LCSW [facility]  Clinical Social Worker cell #: [phone]

## 2018-10-09 NOTE — Progress Notes (Addendum)
[facility]  PATIENT: Sara Barnett DOB: [birth year]   REASON FOR VISIT: follow up for periodic limb movements in sleep HISTORY FROM:patient    HISTORY OF PRESENT ILLNESS: Sara Barnett is a 52 year old right-handed woman with an underlying medical history of hypertension, congenital cataracts, s/p surgeries as a child, lens implants in her 47s, detached retina, hyperlipidemia, impaired fasting glucose, anxiety, depression, asthma, reflux disease,Status post lower back surgery,and obesity, who presents for follow-up consultation of her sleep disturbance, after recent sleep study testing. The patient is unaccompanied today. I first met her on 07/10/2017 at the request of her primary care physician, at which time she reported snoring and daytime somnolence. I asked her to return for a sleep study. She had a baseline sleep study on 12/01/2017. Sleep efficiency was reduced at 60.7% with delayed at 138.5 minutes and REM latency was also markedly delayed at 253.5 minutes. She had an increased wake after sleep onset with mild sleep fragmentation noted and one longer period of wakefulness. She had absent slow-wave sleep and markedly increased percentage of stage II sleep, significantly low percentage of REM sleep at 3.9%. Total AHI was 3 per hour, REM AHI was 21.8 per hour, supine AHI borderline at 5.4 per hour, average oxygen saturation 96%, nadir was 89%, she had intermittent mild snoring. PLM index was 46.5 per hour and PLM arousal index was 12.5 per hour.  Today, 12/15/2017: She reports no new issues, but sleep is difficult at times, feels sluggish in her thinking at times. She reports having coded during one of her eye surgeries. She used to be sharper she feels. She is worried about hereditary condition. She wonders if she is at risk for spinocerebellar ataxia. She is advised that the risk depends on the type of ataxia. She does endorse some restless leg symptoms. Her balance  is not as good. She endorses waking up with a startle at times. She does not sleep very well at night and does not wake up rested, she is tired during the day. UPDATE 5/20/2019CM Sara Barnett, 52 year old female returns for follow-up.  She was placed on Mirapex after her last visit .  Her ferritin level was low at 17.  Her iron saturation was also low.  She was placed on over-the-counter iron supplement.  She was made aware these levels  can exacerbate restless leg symptoms or leg movements.  We will recheck levels today.  She reports that her balance is not good however she has not had any falls she returns for reevaluation UPDATE 12/16/2019CM Sara Barnett, 52 year old female returns for follow-up with history of restless leg syndrome.  She has had abnormal ferritin in the past.  She is currently 0.125  mg 3 tablets each evening.  Last ferritin level in May 2019 was 25.  She claims she is taking her iron pill but recently went through major depression and stopped taking all of her medications for a while.  She claims the Mirapex has been good for her restless legs.  She continues to complain with balance issues however she has not had any falls.  She returns for reevaluation   Previously:  07/10/2017: (She) reports snoring and excessive daytime somnolence. I reviewed your office note from 07/01/2017, which you kindly included. Her Epworth sleepiness score is 14 out of 24 today, fatigue score is 59 out of 63. She lives with her 2 daughters. She is on disability, quit smoking in 2009, drinks alcohol occasionally, caffeine in the form of energy drinks about  1 per day, coffee 2-3 cups, tea occasional.  She denies morning headaches, nocturia is about 1/night. She goes to bed between 10-11 PM and WT is around 7:30 PM.She does not watch TV in bed.She reports sleep talking and denies telltale Sx of RLS and no PLMs are reported.  She takes clonazepam, sometimes at night for sleep. She takes Robaxin  prn.    REVIEW OF SYSTEMS: Full 14 system review of systems performed and notable only for those listed, all others are neg:  Constitutional: neg  Cardiovascular: neg Ear/Nose/Throat: neg  Skin: neg Eyes: neg Respiratory: neg Gastroitestinal: neg  Hematology/Lymphatic: neg  Endocrine: neg Musculoskeletal:neg Allergy/Immunology: neg Neurological: neg Psychiatric: Depression Sleep : Restless leg   ALLERGIES: Allergies  Allergen Reactions  . Methylprednisolone Anaphylaxis and Other (See Comments)    CARDIAC ARREST/PEA   . Augmentin [Amoxicillin-Pot Clavulanate] Other (See Comments)    "FEELING HOT"; EXTREME SLEEPINESS  . Doxycycline Itching and Other (See Comments)    THROAT SWELLING  . Hydrocodone-Acetaminophen Itching and Other (See Comments)    THROAT SWELLING  . Statins Other (See Comments)    JOINT PAIN, swellling  . Other Other (See Comments)    Steroids - Unknown drug and strength, stopped patient's heart    HOME MEDICATIONS: Outpatient Medications Prior to Visit  Medication Sig Dispense Refill  . amLODipine-benazepril (LOTREL) 10-40 MG per capsule Take 1 capsule by mouth daily.    . Cholecalciferol (VITAMIN D3) 2000 units TABS Take 2,000 Units by mouth daily.    . clonazePAM (KLONOPIN) 0.5 MG tablet Take 0.5 mg by mouth 2 (two) times daily as needed for anxiety.    . Ferrous Gluconate (IRON) 240 (27 Fe) MG TABS Take 240 mg by mouth daily.    . fexofenadine-pseudoephedrine (ALLEGRA-D) 60-120 MG 12 hr tablet Take 1 tablet by mouth daily as needed (for allergies).    . fluticasone (FLONASE) 50 MCG/ACT nasal spray Place 2 sprays into both nostrils daily as needed for allergies.   3  . guaiFENesin (MUCINEX) 600 MG 12 hr tablet Take 600 mg by mouth daily as needed for cough.     . lansoprazole (PREVACID) 30 MG capsule Take 30 mg by mouth daily.     Marland Kitchen linaclotide (LINZESS) 290 MCG CAPS capsule Take 1 capsule (290 mcg total) by mouth daily before breakfast. 90 capsule  3  . Multiple Vitamin (MULTIVITAMIN) tablet Take 1 tablet by mouth daily.    . Omega-3 Fatty Acids (SALMON OIL-1000 PO) Take 2,000 mg by mouth daily.    Marland Kitchen OVER THE COUNTER MEDICATION Take 2 tablets by mouth daily. Hair Growth Supplement    . phentermine (ADIPEX-P) 37.5 MG tablet Take 37.5 mg by mouth daily.   2  . pramipexole (MIRAPEX) 0.125 MG tablet Take 3 tablets each evening. Take 90-120 min before bedtime. (Patient taking differently: Take 0.375 mg by mouth at bedtime. ) 270 tablet 1  . sulfamethoxazole-trimethoprim (BACTRIM DS,SEPTRA DS) 800-160 MG tablet Take 1 tablet by mouth 2 (two) times daily.    . vitamin C (ASCORBIC ACID) 500 MG tablet Take 500 mg by mouth daily.     No facility-administered medications prior to visit.     PAST MEDICAL HISTORY: Past Medical History:  Diagnosis Date  . Abnormally small mouth   . Anxiety   . Arthritis    knees  . Chronic ethmoidal sinusitis 07/2017  . Chronic maxillary sinusitis 07/2017  . Complication of anesthesia    anaphylaxis after Methylprednisolone injection - cardiac arrest/PEA  .  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 Constipation   . Depression   . Deviated nasal septum 07/2017  . GERD (gastroesophageal reflux disease)   . History of cardiac murmur    states no known problems, no cardiologist  . Hypertension    states under control with meds., has been on med. x 17 years  . Legally blind   . Nasal turbinate hypertrophy 07/2017  . Rash of back 08/18/2017  . Restless leg     PAST SURGICAL HISTORY: Past Surgical History:  Procedure Laterality Date  . ABLATION ON ENDOMETRIOSIS    . BUNIONECTOMY Right   . CATARACT EXTRACTION, BILATERAL     age 41 year  . CESAREAN SECTION     x 2  . COLONOSCOPY WITH PROPOFOL N/A 08/25/2018   Procedure: COLONOSCOPY WITH PROPOFOL;  Surgeon: Danie Binder, MD;  [facility];  Service: Endoscopy;  Laterality: N/A;  2:15pm  . ENDOSCOPIC CONCHA BULLOSA RESECTION Bilateral  08/25/2017   Procedure: BILATERAL ENDOSCOPIC CONCHA BULLOSA RESECTION;  Surgeon: Leta Baptist, MD;  Location: [place];  Service: ENT;  Laterality: Bilateral;  . ETHMOIDECTOMY Bilateral 08/25/2017   Procedure: BILATERAL TOTAL ETHMOIDECTOMY;  Surgeon: Leta Baptist, MD;  Location: [place];  Service: ENT;  Laterality: Bilateral;  . EYE SURGERY    . INTRAOCULAR LENS INSERTION Bilateral    age 43s  . LUMBAR FUSION  07/05/2004  . LUMBAR LAMINECTOMY  07/05/2004   L5-S1  . MAXILLARY ANTROSTOMY Bilateral 08/25/2017   Procedure: BILATERAL MAXILLARY ANTROSTOMY WITH TISSUE REMOVAL;  Surgeon: Leta Baptist, MD;  Location: [place];  Service: ENT;  Laterality: Bilateral;  . MEMBRANE PEEL Right 11/06/2011; 08/06/2013  . NASAL SEPTOPLASTY W/ TURBINOPLASTY Bilateral 08/25/2017   Procedure: NASAL SEPTOPLASTY WITH BILATERAL TURBINATE REDUCTION;  Surgeon: Leta Baptist, MD;  Location: [place];  Service: ENT;  Laterality: Bilateral;  . PARS PLANA VITRECTOMY Right 03/22/2011; 08/21/2011; 12/20/2011; 05/13/2012; 11/20/2012; 05/12/2013; 08/06/2013; 05/17/2016  . PARS PLANA VITRECTOMY W/ SCLERAL BUCKLE Left 04/21/2015  . PLANTAR FASCIA RELEASE Right   . SINUS ENDO W/FUSION Bilateral 08/25/2017   Procedure: ENDOSCOPIC SINUS SURGERY WITH FUSION NAVIGATION;  Surgeon: Leta Baptist, MD;  Location: [place];  Service: ENT;  Laterality: Bilateral;  . TUBAL LIGATION      FAMILY HISTORY: Family History  Problem Relation Age of Onset  . Depression Mother   . Cancer Father        prostate  . Hypertension Father   . Hearing loss Father   . Other Father        dialysis  . Vision loss Daughter        cataracts  . Stroke Maternal Grandmother   . Other Paternal Grandmother        lung issues  . Vision loss Daughter        cataracts  . Colon cancer Neg Hx   . Colon polyps Neg Hx     SOCIAL HISTORY: Social History   Socioeconomic History  . Marital  status: Divorced    Spouse name: Not on file  . Number of children: Not on file  . Years of education: Not on file  . Highest education level: Not on file  Occupational History  . Not on file  Social Needs  . Financial resource strain: Not on file  . Food insecurity:    Worry: Not on file    Inability: Not on file  . Transportation  needs:    Medical: Not on file    Non-medical: Not on file  Tobacco Use  . Smoking status: Former Smoker    Packs/day: 3.00    Years: 15.00    Pack years: 45.00    Types: Cigarettes    Last attempt to quit: 05/24/2009    Years since quitting: 9.3  . Smokeless tobacco: Never Used  Substance and Sexual Activity  . Alcohol use: Yes    Comment: occasionally  . Drug use: No  . Sexual activity: Not Currently    Birth control/protection: None, Surgical    Comment: tubal and ablation  Lifestyle  . Physical activity:    Days per week: Not on file    Minutes per session: Not on file  . Stress: Not on file  Relationships  . Social connections:    Talks on phone: Not on file    Gets together: Not on file    Attends religious service: Not on file    Active member of club or organization: Not on file    Attends meetings of clubs or organizations: Not on file    Relationship status: Not on file  . Intimate partner violence:    Fear of current or ex partner: Not on file    Emotionally abused: Not on file    Physically abused: Not on file    Forced sexual activity: Not on file  Other Topics Concern  . Not on file  Social History Narrative  . Not on file     PHYSICAL EXAM  Vitals:   10/12/18 1413  BP: (!) 137/92  Pulse: (!) 104  Weight: 160 lb (72.6 kg)  Height: _0  (1.6 m)   Body mass index is 28.34 kg/m.  Generalized: Well developed, in no acute distress  Head: normocephalic and atraumatic,. Oropharynx benign  Neck: Supple,  Musculoskeletal: No deformity   Neurological examination   Mentation: Alert oriented to time, place,  history taking. Attention span and concentration appropriate. Recent and remote memory intact.  Follows all commands speech and language fluent.   Cranial nerve II-XII: .Irregular and equal pupils not clearly reactive extraocular tracking good  visual field were full on confrontational test. Facial sensation and strength were normal. hearing was intact to finger rubbing bilaterally. Uvula tongue midline. head turning and shoulder shrug were normal and symmetric.Tongue protrusion into cheek strength was normal. Motor: normal bulk and tone, full strength in the BUE, BLE, fine finger movements normal, no pronator drift. No focal weakness Sensory: normal and symmetric to light touch,  Coordination: finger-nose-finger, heel-to-shin bilaterally, no dysmetria Reflexes: 1+ upper lower and symmetric, plantar responses were flexor bilaterally. Gait and Station: Rising up from seated position without assistance, normal stance,  moderate stride, good arm swing, smooth turning, able to perform tiptoe, and heel walking without difficulty. Tandem gait is steady  DIAGNOSTIC DATA (LABS, IMAGING, TESTING) - I reviewed patient records, labs, notes, testing and imaging myself where available.  Lab Results  Component Value Date   WBC 5.2 09/11/2018   HGB 14.9 09/11/2018   HCT 44.7 09/11/2018   MCV 90.1 09/11/2018   PLT 367 09/11/2018      Component Value Date/Time   NA 140 09/11/2018 2250   NA 138 12/13/2015 1646   K 2.9 (L) 09/11/2018 2250   CL 106 09/11/2018 2250   CO2 21 (L) 09/11/2018 2250   GLUCOSE 120 (H) 09/11/2018 2250   BUN 12 09/11/2018 2250   BUN 10 12/13/2015 1646  CREATININE 0.74 09/11/2018 2250   CALCIUM 9.2 09/11/2018 2250   PROT 7.0 09/11/2018 2250   PROT 7.1 12/13/2015 1646   ALBUMIN 3.5 09/11/2018 2250   ALBUMIN 4.3 12/13/2015 1646   AST 19 09/11/2018 2250   ALT 21 09/11/2018 2250   ALKPHOS 65 09/11/2018 2250   BILITOT 0.7 09/11/2018 2250   BILITOT 0.5 12/13/2015 1646    GFRNONAA >60 09/11/2018 2250   GFRAA >60 09/11/2018 2250    Lab Results  Component Value Date   VITAMINB12 806 12/13/2015     ASSESSMENT AND PLAN  Olivia Mackie L Bondurantis a very pleasant 75year oldfemalewith an underlying medical history of hypertension, congenital cataracts, s/p surgeries as a child, lens implants in her 64s, detached retina, hyperlipidemia, impaired fasting glucose, anxiety, depression, asthma, reflux disease,Status post lower back surgery,and obesity, who presents for follow-up consultation of her sleep disorder, after recent sleep study testing. She reports issues with sleep onset and sleep maintenance at night, she does not always have telltale symptoms of restless legs but does feel restless at night and does not wake up rested. Her sleep study from 12/01/2017 showed no significant obstructive sleep apnea, she had significant PLMS with and without arousals. Of note, she is on Cymbalta which can cause PLMS and also exacerbate restless leg symptoms. Blood work for ferritin and iron stores returned low.  We will repeat today    Ferritin  Level today  Continue iron daily Continue Mirapex at current dose does not need refills Stay well hydrated F/U in 6  To 8 months Patient had a recent break-up with a boyfriend and has had ER visit for major depression.  She has been encouraged to follow-up with primary care or a psychiatrist for management Dennie Bible, [facility], [facility], APRN  [facility] [address], [place], [place] [address] [phone]  I reviewed the above note and documentation by the Nurse Practitioner and agree with the history, physical exam, assessment and plan as outlined above. I was immediately available for face-to-face consultation. Star Age, MD, PhD [facility])

## 2018-10-12 ENCOUNTER — Encounter: Payer: Self-pay | Admitting: Nurse Practitioner

## 2018-10-12 ENCOUNTER — Ambulatory Visit (INDEPENDENT_AMBULATORY_CARE_PROVIDER_SITE_OTHER): Payer: PPO | Admitting: Nurse Practitioner

## 2018-10-12 VITALS — BP 137/92 | HR 104 | Ht 63.0 in | Wt 160.0 lb

## 2018-10-12 DIAGNOSIS — G2581 Restless legs syndrome: Secondary | ICD-10-CM

## 2018-10-12 DIAGNOSIS — Z5181 Encounter for therapeutic drug level monitoring: Secondary | ICD-10-CM | POA: Diagnosis not present

## 2018-10-12 NOTE — Addendum Note (Signed)
Addended by: Inis Sizer D on: 10/12/2018 02:56 PM   Modules accepted: Orders

## 2018-10-12 NOTE — Patient Instructions (Signed)
Ferritin  Level today  Continue iron daily Continue Mirapex at current dose  Stay well hydrated F/U in 6  To 8 months

## 2018-10-13 ENCOUNTER — Ambulatory Visit: Payer: Self-pay | Admitting: *Deleted

## 2018-10-13 LAB — IRON AND TIBC
IRON SATURATION: 35 % (ref 15–55)
Iron: 97 ug/dL (ref 27–159)
TIBC: 278 ug/dL (ref 250–450)
UIBC: 181 ug/dL (ref 131–425)

## 2018-10-13 LAB — FERRITIN: FERRITIN: 54 ng/mL (ref 15–150)

## 2018-10-14 ENCOUNTER — Telehealth: Payer: Self-pay | Admitting: *Deleted

## 2018-10-14 NOTE — Telephone Encounter (Signed)
LMVM for pt to return call.  Did not have DPR listed that I could see.  If she calls back you can let her know that her iron and ferritin labs were normal limits, continue with OTC iron.

## 2018-10-14 NOTE — Telephone Encounter (Signed)
-----   Message from Dennie Bible, NP sent at 10/13/2018  8:20 AM EST ----- Iron studies and ferritin level within normal limits please call the patient.  Continue over-the-counter iron.

## 2018-10-15 ENCOUNTER — Encounter: Payer: Self-pay | Admitting: *Deleted

## 2018-10-15 NOTE — Telephone Encounter (Signed)
Mailed result letter to pt.

## 2018-10-27 DIAGNOSIS — Z713 Dietary counseling and surveillance: Secondary | ICD-10-CM | POA: Diagnosis not present

## 2018-10-27 DIAGNOSIS — F419 Anxiety disorder, unspecified: Secondary | ICD-10-CM | POA: Diagnosis not present

## 2018-10-27 DIAGNOSIS — E663 Overweight: Secondary | ICD-10-CM | POA: Diagnosis not present

## 2018-10-27 DIAGNOSIS — Z6829 Body mass index (BMI) 29.0-29.9, adult: Secondary | ICD-10-CM | POA: Diagnosis not present

## 2018-10-29 DIAGNOSIS — K219 Gastro-esophageal reflux disease without esophagitis: Secondary | ICD-10-CM | POA: Diagnosis not present

## 2018-10-29 DIAGNOSIS — Z713 Dietary counseling and surveillance: Secondary | ICD-10-CM | POA: Diagnosis not present

## 2018-10-29 DIAGNOSIS — E785 Hyperlipidemia, unspecified: Secondary | ICD-10-CM | POA: Diagnosis not present

## 2018-10-29 DIAGNOSIS — R7301 Impaired fasting glucose: Secondary | ICD-10-CM | POA: Diagnosis not present

## 2018-10-29 DIAGNOSIS — Z1329 Encounter for screening for other suspected endocrine disorder: Secondary | ICD-10-CM | POA: Diagnosis not present

## 2018-10-29 DIAGNOSIS — I10 Essential (primary) hypertension: Secondary | ICD-10-CM | POA: Diagnosis not present

## 2018-10-29 DIAGNOSIS — Z6832 Body mass index (BMI) 32.0-32.9, adult: Secondary | ICD-10-CM | POA: Diagnosis not present

## 2018-11-02 DIAGNOSIS — J0100 Acute maxillary sinusitis, unspecified: Secondary | ICD-10-CM | POA: Diagnosis not present

## 2018-11-02 DIAGNOSIS — J019 Acute sinusitis, unspecified: Secondary | ICD-10-CM | POA: Diagnosis not present

## 2018-11-02 DIAGNOSIS — E6609 Other obesity due to excess calories: Secondary | ICD-10-CM | POA: Diagnosis not present

## 2018-11-02 DIAGNOSIS — E785 Hyperlipidemia, unspecified: Secondary | ICD-10-CM | POA: Diagnosis not present

## 2018-11-02 DIAGNOSIS — Z8669 Personal history of other diseases of the nervous system and sense organs: Secondary | ICD-10-CM | POA: Diagnosis not present

## 2018-11-02 DIAGNOSIS — Z6830 Body mass index (BMI) 30.0-30.9, adult: Secondary | ICD-10-CM | POA: Diagnosis not present

## 2018-11-02 DIAGNOSIS — F411 Generalized anxiety disorder: Secondary | ICD-10-CM | POA: Diagnosis not present

## 2018-11-02 DIAGNOSIS — I10 Essential (primary) hypertension: Secondary | ICD-10-CM | POA: Diagnosis not present

## 2018-11-02 DIAGNOSIS — K219 Gastro-esophageal reflux disease without esophagitis: Secondary | ICD-10-CM | POA: Diagnosis not present

## 2018-11-02 DIAGNOSIS — M542 Cervicalgia: Secondary | ICD-10-CM | POA: Diagnosis not present

## 2018-11-02 DIAGNOSIS — Z6831 Body mass index (BMI) 31.0-31.9, adult: Secondary | ICD-10-CM | POA: Diagnosis not present

## 2018-11-02 DIAGNOSIS — E782 Mixed hyperlipidemia: Secondary | ICD-10-CM | POA: Diagnosis not present

## 2018-11-02 DIAGNOSIS — R7301 Impaired fasting glucose: Secondary | ICD-10-CM | POA: Diagnosis not present

## 2018-11-02 DIAGNOSIS — S39012A Strain of muscle, fascia and tendon of lower back, initial encounter: Secondary | ICD-10-CM | POA: Diagnosis not present

## 2018-11-05 DIAGNOSIS — F411 Generalized anxiety disorder: Secondary | ICD-10-CM | POA: Diagnosis not present

## 2018-11-05 DIAGNOSIS — E6609 Other obesity due to excess calories: Secondary | ICD-10-CM | POA: Diagnosis not present

## 2018-11-05 DIAGNOSIS — M542 Cervicalgia: Secondary | ICD-10-CM | POA: Diagnosis not present

## 2018-11-05 DIAGNOSIS — N899 Noninflammatory disorder of vagina, unspecified: Secondary | ICD-10-CM | POA: Diagnosis not present

## 2018-11-05 DIAGNOSIS — N898 Other specified noninflammatory disorders of vagina: Secondary | ICD-10-CM | POA: Diagnosis not present

## 2018-11-05 DIAGNOSIS — I10 Essential (primary) hypertension: Secondary | ICD-10-CM | POA: Diagnosis not present

## 2018-11-05 DIAGNOSIS — R300 Dysuria: Secondary | ICD-10-CM | POA: Diagnosis not present

## 2018-11-05 DIAGNOSIS — Z6830 Body mass index (BMI) 30.0-30.9, adult: Secondary | ICD-10-CM | POA: Diagnosis not present

## 2018-11-05 DIAGNOSIS — K219 Gastro-esophageal reflux disease without esophagitis: Secondary | ICD-10-CM | POA: Diagnosis not present

## 2018-11-05 DIAGNOSIS — Z8669 Personal history of other diseases of the nervous system and sense organs: Secondary | ICD-10-CM | POA: Diagnosis not present

## 2018-11-05 DIAGNOSIS — E782 Mixed hyperlipidemia: Secondary | ICD-10-CM | POA: Diagnosis not present

## 2018-11-05 DIAGNOSIS — M25569 Pain in unspecified knee: Secondary | ICD-10-CM | POA: Diagnosis not present

## 2018-11-05 DIAGNOSIS — Z6831 Body mass index (BMI) 31.0-31.9, adult: Secondary | ICD-10-CM | POA: Diagnosis not present

## 2018-11-05 DIAGNOSIS — R7301 Impaired fasting glucose: Secondary | ICD-10-CM | POA: Diagnosis not present

## 2018-11-05 DIAGNOSIS — J060 Acute laryngopharyngitis: Secondary | ICD-10-CM | POA: Diagnosis not present

## 2018-11-05 DIAGNOSIS — J019 Acute sinusitis, unspecified: Secondary | ICD-10-CM | POA: Diagnosis not present

## 2018-11-05 DIAGNOSIS — E785 Hyperlipidemia, unspecified: Secondary | ICD-10-CM | POA: Diagnosis not present

## 2018-11-05 DIAGNOSIS — J0100 Acute maxillary sinusitis, unspecified: Secondary | ICD-10-CM | POA: Diagnosis not present

## 2018-11-05 DIAGNOSIS — M79601 Pain in right arm: Secondary | ICD-10-CM | POA: Diagnosis not present

## 2018-11-09 DIAGNOSIS — N760 Acute vaginitis: Secondary | ICD-10-CM | POA: Diagnosis not present

## 2018-11-12 DIAGNOSIS — L292 Pruritus vulvae: Secondary | ICD-10-CM | POA: Diagnosis not present

## 2018-11-12 DIAGNOSIS — N390 Urinary tract infection, site not specified: Secondary | ICD-10-CM | POA: Diagnosis not present

## 2018-11-12 DIAGNOSIS — B9629 Other Escherichia coli [E. coli] as the cause of diseases classified elsewhere: Secondary | ICD-10-CM | POA: Diagnosis not present

## 2018-12-23 DIAGNOSIS — I10 Essential (primary) hypertension: Secondary | ICD-10-CM | POA: Diagnosis not present

## 2018-12-23 DIAGNOSIS — E663 Overweight: Secondary | ICD-10-CM | POA: Diagnosis not present

## 2019-01-07 DIAGNOSIS — H524 Presbyopia: Secondary | ICD-10-CM | POA: Diagnosis not present

## 2019-01-10 ENCOUNTER — Other Ambulatory Visit: Payer: Self-pay | Admitting: Nurse Practitioner

## 2019-01-12 ENCOUNTER — Telehealth: Payer: Self-pay | Admitting: Gastroenterology

## 2019-01-12 NOTE — Telephone Encounter (Signed)
Forwarding to Anna Boone, NP to advise! 

## 2019-01-12 NOTE — Telephone Encounter (Signed)
Can provide samples of Linzess 145 mcg. Let us know how it works for her.

## 2019-01-12 NOTE — Telephone Encounter (Signed)
[phone] please call patient about her linzess, her mg is working too well and wants to know if she can go to a lower dosage

## 2019-01-14 DIAGNOSIS — E663 Overweight: Secondary | ICD-10-CM | POA: Diagnosis not present

## 2019-01-14 DIAGNOSIS — Z1331 Encounter for screening for depression: Secondary | ICD-10-CM | POA: Diagnosis not present

## 2019-01-14 DIAGNOSIS — F329 Major depressive disorder, single episode, unspecified: Secondary | ICD-10-CM | POA: Diagnosis not present

## 2019-01-14 NOTE — Telephone Encounter (Signed)
Pt is aware I have left her #12 at front to pick up to take one capsule daily 30 min before breakfast.

## 2019-01-22 ENCOUNTER — Telehealth: Payer: Self-pay | Admitting: Gastroenterology

## 2019-01-22 MED ORDER — LINACLOTIDE 145 MCG PO CAPS: 145.0000 ug | ORAL_CAPSULE | Freq: Every day | ORAL | 3 refills | Status: DC

## 2019-01-22 NOTE — Addendum Note (Signed)
Addended by: Annitta Needs on: 01/22/2019 11:06 AM   Modules accepted: Orders

## 2019-01-22 NOTE — Telephone Encounter (Signed)
Pt has been taking the Linzess 145 mcg daily and sometimes adding a little Miralax to some coffee. She feels like she doesn't completely evacuate every time and she feels bloated a lot. She is finishing the samples of Linzess 145 mxcg and would like an Rx sent to the pharmacy. I told her it might need PA and to let us know. I told her she should try complete doses of Miralax instead of just a little in coffee. She is aware she can take a dose bid if needed.  Forwarding to Roseanne Kaufman, NP to send in Rx.

## 2019-01-22 NOTE — Telephone Encounter (Signed)
[phone] PLEASE CALL PATIENT ABOUT HER LINZESS, SHE IS STILL HAVING TO TAKE MIRILAX WITH THE SAMPLES WE GAVE HER.  SHE HAS A FEW QUESTIONS.

## 2019-01-22 NOTE — Telephone Encounter (Signed)
Pt is aware.  

## 2019-01-22 NOTE — Telephone Encounter (Signed)
Sent Linzess 145 mcg to pharmacy.

## 2019-02-04 DIAGNOSIS — E785 Hyperlipidemia, unspecified: Secondary | ICD-10-CM | POA: Diagnosis not present

## 2019-02-04 DIAGNOSIS — J449 Chronic obstructive pulmonary disease, unspecified: Secondary | ICD-10-CM | POA: Diagnosis not present

## 2019-02-04 DIAGNOSIS — Z7951 Long term (current) use of inhaled steroids: Secondary | ICD-10-CM | POA: Diagnosis not present

## 2019-02-04 DIAGNOSIS — I2510 Atherosclerotic heart disease of native coronary artery without angina pectoris: Secondary | ICD-10-CM | POA: Diagnosis not present

## 2019-02-04 DIAGNOSIS — Z0001 Encounter for general adult medical examination with abnormal findings: Secondary | ICD-10-CM | POA: Diagnosis not present

## 2019-02-04 DIAGNOSIS — Z87891 Personal history of nicotine dependence: Secondary | ICD-10-CM | POA: Diagnosis not present

## 2019-02-04 DIAGNOSIS — Z7982 Long term (current) use of aspirin: Secondary | ICD-10-CM | POA: Diagnosis not present

## 2019-02-04 DIAGNOSIS — Z951 Presence of aortocoronary bypass graft: Secondary | ICD-10-CM | POA: Diagnosis not present

## 2019-02-04 DIAGNOSIS — Z8546 Personal history of malignant neoplasm of prostate: Secondary | ICD-10-CM | POA: Diagnosis not present

## 2019-02-04 DIAGNOSIS — Z79899 Other long term (current) drug therapy: Secondary | ICD-10-CM | POA: Diagnosis not present

## 2019-02-04 DIAGNOSIS — M1711 Unilateral primary osteoarthritis, right knee: Secondary | ICD-10-CM | POA: Diagnosis not present

## 2019-02-16 DIAGNOSIS — I10 Essential (primary) hypertension: Secondary | ICD-10-CM | POA: Diagnosis not present

## 2019-02-16 DIAGNOSIS — J019 Acute sinusitis, unspecified: Secondary | ICD-10-CM | POA: Diagnosis not present

## 2019-02-16 DIAGNOSIS — E663 Overweight: Secondary | ICD-10-CM | POA: Diagnosis not present

## 2019-02-16 DIAGNOSIS — F331 Major depressive disorder, recurrent, moderate: Secondary | ICD-10-CM | POA: Diagnosis not present

## 2019-04-08 ENCOUNTER — Telehealth: Payer: Self-pay | Admitting: *Deleted

## 2019-04-08 NOTE — Telephone Encounter (Signed)
myChart Visit set up , My Chart activated by patient

## 2019-04-08 NOTE — Telephone Encounter (Signed)
LMVM for pt. Due to current COVID 19 pandemic, our office is severely reducing in office visits until further notice, in order to minimize the risk to our patients and healthcare providers. Unable to get in contact with the patient to convert their office visit with [facility] on 6/16//2020 into a doxy.me visit or my chart video visit. I left a voicemail asking the patient to return my call. Office number was provided.     If patient calls back please convert their office visit into a doxy.me visit or my chart video visit.

## 2019-04-13 ENCOUNTER — Telehealth (INDEPENDENT_AMBULATORY_CARE_PROVIDER_SITE_OTHER): Payer: PPO | Admitting: Adult Health

## 2019-04-13 DIAGNOSIS — G2581 Restless legs syndrome: Secondary | ICD-10-CM

## 2019-04-13 NOTE — Progress Notes (Addendum)
PATIENT: Sara Barnett DOB: [birth year]  REASON FOR VISIT: follow up HISTORY FROM: patient  Virtual Visit via Video Note  I connected with Sara Barnett on 04/13/19 at 11:30 AM EDT by a video enabled telemedicine application located remotely at [facility] and verified that I am speaking with the correct person using two identifiers who was located at their own home.   I discussed the limitations of evaluation and management by telemedicine and the availability of in person appointments. The patient expressed understanding and agreed to proceed.   PATIENT: Sara Barnett DOB: [birth year]  REASON FOR VISIT: follow up HISTORY FROM: patient  HISTORY OF PRESENT ILLNESS: Today 04/13/19:  Sara Barnett is a 53 year old female with a history of restless leg syndrome.  She reports that she has been taking Mirapex 3 tablets at bedtime.  She reports that this has been beneficial for her restless leg syndrome.  She states that last week she injured her back.  It is unclear how she injured her back.  She states that she has been having some back pain but has not followed with her primary care.  She reports that in the past she has had a spinal fusion.  She also states that his she bends down and stands up she feels lightheaded.  She states this is been going on for quite some time.  She has not mentioned this to her PCP either.  She joins me today for virtual visit.  HISTORY 12/16/2019CM Sara Barnett, 53 year old female returns for follow-up with history of restless leg syndrome.  She has had abnormal ferritin in the past.  She is currently 0.125  mg 3 tablets each evening.  Last ferritin level in May 2019 was 25.  She claims she is taking her iron pill but recently went through major depression and stopped taking all of her medications for a while.  She claims the Mirapex has been good for her restless legs.  She continues to complain with balance issues however she  has not had any falls.  She returns for reevaluatiom  REVIEW OF SYSTEMS: Out of a complete 14 system review of symptoms, the patient complains only of the following symptoms, and all other reviewed systems are negative.  See HPI  ALLERGIES: Allergies  Allergen Reactions  . Methylprednisolone Anaphylaxis and Other (See Comments)    CARDIAC ARREST/PEA   . Augmentin [Amoxicillin-Pot Clavulanate] Other (See Comments)    "FEELING HOT"; EXTREME SLEEPINESS  . Doxycycline Itching and Other (See Comments)    THROAT SWELLING  . Hydrocodone-Acetaminophen Itching and Other (See Comments)    THROAT SWELLING  . Statins Other (See Comments)    JOINT PAIN, swellling  . Other Other (See Comments)    Steroids - Unknown drug and strength, stopped patient's heart    HOME MEDICATIONS: Outpatient Medications Prior to Visit  Medication Sig Dispense Refill  . amLODipine-benazepril (LOTREL) 10-40 MG per capsule Take 1 capsule by mouth daily.    . Cholecalciferol (VITAMIN D3) 2000 units TABS Take 2,000 Units by mouth daily.    . clonazePAM (KLONOPIN) 0.5 MG tablet Take 0.5 mg by mouth 2 (two) times daily as needed for anxiety.    . Ferrous Gluconate (IRON) 240 (27 Fe) MG TABS Take 240 mg by mouth daily.    . fexofenadine-pseudoephedrine (ALLEGRA-D) 60-120 MG 12 hr tablet Take 1 tablet by mouth daily as needed (for allergies).    . fluticasone (FLONASE) 50 MCG/ACT nasal spray Place 2 sprays  into both nostrils daily as needed for allergies.   3  . guaiFENesin (MUCINEX) 600 MG 12 hr tablet Take 600 mg by mouth daily as needed for cough.     . lansoprazole (PREVACID) 30 MG capsule Take 30 mg by mouth daily.     Marland Kitchen linaclotide (LINZESS) 145 MCG CAPS capsule Take 1 capsule (145 mcg total) by mouth daily before breakfast. 90 capsule 3  . linaclotide (LINZESS) 290 MCG CAPS capsule Take 1 capsule (290 mcg total) by mouth daily before breakfast. 90 capsule 3  . Multiple Vitamin (MULTIVITAMIN) tablet Take 1 tablet by  mouth daily.    . Omega-3 Fatty Acids (SALMON OIL-1000 PO) Take 2,000 mg by mouth daily.    Marland Kitchen OVER THE COUNTER MEDICATION Take 2 tablets by mouth daily. Hair Growth Supplement    . phentermine (ADIPEX-P) 37.5 MG tablet Take 37.5 mg by mouth daily.   2  . pramipexole (MIRAPEX) 0.125 MG tablet TAKE 3 TABLETS BY MOUTH IN THE EVENING TAKE  90-120  MINUTES  BEFORE  BEDTIME 270 tablet 1  . sulfamethoxazole-trimethoprim (BACTRIM DS,SEPTRA DS) 800-160 MG tablet Take 1 tablet by mouth 2 (two) times daily.    . vitamin C (ASCORBIC ACID) 500 MG tablet Take 500 mg by mouth daily.     No facility-administered medications prior to visit.     PAST MEDICAL HISTORY: Past Medical History:  Diagnosis Date  . Abnormally small mouth   . Anxiety   . Arthritis    knees  . Chronic ethmoidal sinusitis 07/2017  . Chronic maxillary sinusitis 07/2017  . Complication of anesthesia    anaphylaxis after Methylprednisolone injection - cardiac arrest/PEA  . 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 Constipation   . Depression   . Deviated nasal septum 07/2017  . GERD (gastroesophageal reflux disease)   . History of cardiac murmur    states no known problems, no cardiologist  . Hypertension    states under control with meds., has been on med. x 17 years  . Legally blind   . Nasal turbinate hypertrophy 07/2017  . Rash of back 08/18/2017  . Restless leg     PAST SURGICAL HISTORY: Past Surgical History:  Procedure Laterality Date  . ABLATION ON ENDOMETRIOSIS    . BUNIONECTOMY Right   . CATARACT EXTRACTION, BILATERAL     age 29 year  . CESAREAN SECTION     x 2  . COLONOSCOPY WITH PROPOFOL N/A 08/25/2018   Procedure: COLONOSCOPY WITH PROPOFOL;  Surgeon: Danie Binder, MD;  [facility];  Service: Endoscopy;  Laterality: N/A;  2:15pm  . ENDOSCOPIC CONCHA BULLOSA RESECTION Bilateral 08/25/2017   Procedure: BILATERAL ENDOSCOPIC CONCHA BULLOSA RESECTION;  Surgeon: Leta Baptist, MD;  Location: [place];  Service: ENT;  Laterality: Bilateral;  . ETHMOIDECTOMY Bilateral 08/25/2017   Procedure: BILATERAL TOTAL ETHMOIDECTOMY;  Surgeon: Leta Baptist, MD;  Location: [place];  Service: ENT;  Laterality: Bilateral;  . EYE SURGERY    . INTRAOCULAR LENS INSERTION Bilateral    age 87s  . LUMBAR FUSION  07/05/2004  . LUMBAR LAMINECTOMY  07/05/2004   L5-S1  . MAXILLARY ANTROSTOMY Bilateral 08/25/2017   Procedure: BILATERAL MAXILLARY ANTROSTOMY WITH TISSUE REMOVAL;  Surgeon: Leta Baptist, MD;  Location: [place];  Service: ENT;  Laterality: Bilateral;  . MEMBRANE PEEL Right 11/06/2011; 08/06/2013  . NASAL SEPTOPLASTY W/ TURBINOPLASTY Bilateral 08/25/2017   Procedure: NASAL SEPTOPLASTY WITH BILATERAL TURBINATE REDUCTION;  Surgeon: Leta Baptist, MD;  Location: [place];  Service: ENT;  Laterality: Bilateral;  . PARS PLANA VITRECTOMY Right 03/22/2011; 08/21/2011; 12/20/2011; 05/13/2012; 11/20/2012; 05/12/2013; 08/06/2013; 05/17/2016  . PARS PLANA VITRECTOMY W/ SCLERAL BUCKLE Left 04/21/2015  . PLANTAR FASCIA RELEASE Right   . SINUS ENDO W/FUSION Bilateral 08/25/2017   Procedure: ENDOSCOPIC SINUS SURGERY WITH FUSION NAVIGATION;  Surgeon: Leta Baptist, MD;  Location: [place];  Service: ENT;  Laterality: Bilateral;  . TUBAL LIGATION      FAMILY HISTORY: Family History  Problem Relation Age of Onset  . Depression Mother   . Cancer Father        prostate  . Hypertension Father   . Hearing loss Father   . Other Father        dialysis  . Vision loss Daughter        cataracts  . Stroke Maternal Grandmother   . Other Paternal Grandmother        lung issues  . Vision loss Daughter        cataracts  . Colon cancer Neg Hx   . Colon polyps Neg Hx     SOCIAL HISTORY: Social History   Socioeconomic History  . Marital status: Divorced    Spouse name: Not on file  . Number of children: Not on file  . Years of education: Not on file  .  Highest education level: Not on file  Occupational History  . Not on file  Social Needs  . Financial resource strain: Not on file  . Food insecurity    Worry: Not on file    Inability: Not on file  . Transportation needs    Medical: Not on file    Non-medical: Not on file  Tobacco Use  . Smoking status: Former Smoker    Packs/day: 3.00    Years: 15.00    Pack years: 45.00    Types: Cigarettes    Quit date: 05/24/2009    Years since quitting: 9.8  . Smokeless tobacco: Never Used  Substance and Sexual Activity  . Alcohol use: Yes    Comment: occasionally  . Drug use: No  . Sexual activity: Not Currently    Birth control/protection: None, Surgical    Comment: tubal and ablation  Lifestyle  . Physical activity    Days per week: Not on file    Minutes per session: Not on file  . Stress: Not on file  Relationships  . Social Herbalist on phone: Not on file    Gets together: Not on file    Attends religious service: Not on file    Active member of club or organization: Not on file    Attends meetings of clubs or organizations: Not on file    Relationship status: Not on file  . Intimate partner violence    Fear of current or ex partner: Not on file    Emotionally abused: Not on file    Physically abused: Not on file    Forced sexual activity: Not on file  Other Topics Concern  . Not on file  Social History Narrative  . Not on file      PHYSICAL EXAM Generalized: Well developed, in no acute distress   Neurological examination  Mentation: Alert oriented to time, place, history taking. Follows all commands speech and language fluent Cranial nerve II-XII:Extraocular movements were full. Facial symmetry noted. uvula tongue midline. Head turning and shoulder shrug  were normal  and symmetric. Motor: Good strength throughout subjectively per patient Sensory: Sensory testing is intact to soft touch on all 4 extremities subjectively per patient Coordination:  Cerebellar testing reveals good finger-nose-finger  Gait and station: Patient is able to stand from a seated position. gait is normal.  Reflexes: UTA  DIAGNOSTIC DATA (LABS, IMAGING, TESTING) - I reviewed patient records, labs, notes, testing and imaging myself where available.  Lab Results  Component Value Date   WBC 5.2 09/11/2018   HGB 14.9 09/11/2018   HCT 44.7 09/11/2018   MCV 90.1 09/11/2018   PLT 367 09/11/2018      Component Value Date/Time   NA 140 09/11/2018 2250   NA 138 12/13/2015 1646   K 2.9 (L) 09/11/2018 2250   CL 106 09/11/2018 2250   CO2 21 (L) 09/11/2018 2250   GLUCOSE 120 (H) 09/11/2018 2250   BUN 12 09/11/2018 2250   BUN 10 12/13/2015 1646   CREATININE 0.74 09/11/2018 2250   CALCIUM 9.2 09/11/2018 2250   PROT 7.0 09/11/2018 2250   PROT 7.1 12/13/2015 1646   ALBUMIN 3.5 09/11/2018 2250   ALBUMIN 4.3 12/13/2015 1646   AST 19 09/11/2018 2250   ALT 21 09/11/2018 2250   ALKPHOS 65 09/11/2018 2250   BILITOT 0.7 09/11/2018 2250   BILITOT 0.5 12/13/2015 1646   GFRNONAA >60 09/11/2018 2250   GFRAA >60 09/11/2018 2250      ASSESSMENT AND PLAN 53 y.o. year old female  has a past medical history of Abnormally small mouth, Anxiety, Arthritis, Chronic ethmoidal sinusitis (07/2017), Chronic maxillary sinusitis (19/5093), Complication of anesthesia, Concha bullosa (07/2017), Constipation, Depression, Deviated nasal septum (07/2017), GERD (gastroesophageal reflux disease), History of cardiac murmur, Hypertension, Legally blind, Nasal turbinate hypertrophy (07/2017), Rash of back (08/18/2017), and Restless leg. here with:  1. RLS  Overall the patient is doing well. She will continue on Mirapex. If symptoms worsen or she develops new symptoms she should let us know.  She has been encouraged to follow-up with her primary care regarding potential orthostatic hypotension and back pain.   I spent 15 minutes with the patient. 50% of this time was spent this time was  spent discussing her symptoms and plan of care.   Ward Givens, MSN, NP-C 04/13/2019, 11:57 AM [facility] [address] [place], [place] [address] [phone]  I reviewed the above note and documentation by the Nurse Practitioner and agree with the history, exam, assessment and plan as outlined above. I was immediately available for consultation. Star Age, MD, PhD [facility])

## 2019-04-13 NOTE — Telephone Encounter (Signed)
Spoke to pt and she consented to [employer].  She did receive email to activate. Due to current COVID 19 pandemic, our office is severely reducing in office visits until further notice, in order to minimize the risk to our patients and healthcare providers.  Pt understands that although there may be some limitations with this type of visit, we will take all precautions to reduce any security or privacy concerns.  Pt understands that this will be treated like an in office visit and we will file with pt's insurance, and there may be a patient responsible charge related to this service.  email sent to her with mychart activation code.

## 2019-04-16 DIAGNOSIS — F39 Unspecified mood [affective] disorder: Secondary | ICD-10-CM | POA: Diagnosis not present

## 2019-04-16 DIAGNOSIS — R42 Dizziness and giddiness: Secondary | ICD-10-CM | POA: Diagnosis not present

## 2019-04-24 ENCOUNTER — Inpatient Hospital Stay (HOSPITAL_COMMUNITY)
Admission: EM | Admit: 2019-04-24 | Discharge: 2019-05-01 | DRG: 918 | Disposition: A | Discharge status: Other Acute Inpatient Hospital | Payer: PPO | Source: Other Acute Inpatient Hospital | Attending: Student | Admitting: Student

## 2019-04-24 ENCOUNTER — Emergency Department (HOSPITAL_COMMUNITY): Payer: PPO

## 2019-04-24 ENCOUNTER — Other Ambulatory Visit: Payer: Self-pay

## 2019-04-24 ENCOUNTER — Encounter (HOSPITAL_COMMUNITY): Payer: Self-pay | Admitting: *Deleted

## 2019-04-24 DIAGNOSIS — F419 Anxiety disorder, unspecified: Secondary | ICD-10-CM | POA: Diagnosis present

## 2019-04-24 DIAGNOSIS — R578 Other shock: Secondary | ICD-10-CM | POA: Diagnosis not present

## 2019-04-24 DIAGNOSIS — T450X2A Poisoning by antiallergic and antiemetic drugs, intentional self-harm, initial encounter: Secondary | ICD-10-CM | POA: Diagnosis present

## 2019-04-24 DIAGNOSIS — T461X2A Poisoning by calcium-channel blockers, intentional self-harm, initial encounter: Secondary | ICD-10-CM | POA: Diagnosis present

## 2019-04-24 DIAGNOSIS — R579 Shock, unspecified: Secondary | ICD-10-CM | POA: Diagnosis present

## 2019-04-24 DIAGNOSIS — T510X2A Toxic effect of ethanol, intentional self-harm, initial encounter: Secondary | ICD-10-CM | POA: Diagnosis present

## 2019-04-24 DIAGNOSIS — Z818 Family history of other mental and behavioral disorders: Secondary | ICD-10-CM | POA: Diagnosis not present

## 2019-04-24 DIAGNOSIS — T50902A Poisoning by unspecified drugs, medicaments and biological substances, intentional self-harm, initial encounter: Secondary | ICD-10-CM | POA: Diagnosis present

## 2019-04-24 DIAGNOSIS — Z751 Person awaiting admission to adequate facility elsewhere: Secondary | ICD-10-CM | POA: Diagnosis not present

## 2019-04-24 DIAGNOSIS — Z20828 Contact with and (suspected) exposure to other viral communicable diseases: Secondary | ICD-10-CM | POA: Diagnosis present

## 2019-04-24 DIAGNOSIS — G4700 Insomnia, unspecified: Secondary | ICD-10-CM | POA: Diagnosis not present

## 2019-04-24 DIAGNOSIS — T424X2A Poisoning by benzodiazepines, intentional self-harm, initial encounter: Principal | ICD-10-CM | POA: Diagnosis present

## 2019-04-24 DIAGNOSIS — K589 Irritable bowel syndrome without diarrhea: Secondary | ICD-10-CM | POA: Diagnosis not present

## 2019-04-24 DIAGNOSIS — F329 Major depressive disorder, single episode, unspecified: Secondary | ICD-10-CM | POA: Diagnosis present

## 2019-04-24 DIAGNOSIS — J322 Chronic ethmoidal sinusitis: Secondary | ICD-10-CM | POA: Diagnosis present

## 2019-04-24 DIAGNOSIS — E162 Hypoglycemia, unspecified: Secondary | ICD-10-CM | POA: Diagnosis not present

## 2019-04-24 DIAGNOSIS — K5909 Other constipation: Secondary | ICD-10-CM | POA: Diagnosis present

## 2019-04-24 DIAGNOSIS — T1491XA Suicide attempt, initial encounter: Secondary | ICD-10-CM | POA: Diagnosis not present

## 2019-04-24 DIAGNOSIS — I313 Pericardial effusion (noninflammatory): Secondary | ICD-10-CM | POA: Diagnosis present

## 2019-04-24 DIAGNOSIS — Z87891 Personal history of nicotine dependence: Secondary | ICD-10-CM | POA: Diagnosis not present

## 2019-04-24 DIAGNOSIS — T450X1A Poisoning by antiallergic and antiemetic drugs, accidental (unintentional), initial encounter: Secondary | ICD-10-CM | POA: Diagnosis not present

## 2019-04-24 DIAGNOSIS — I10 Essential (primary) hypertension: Secondary | ICD-10-CM | POA: Diagnosis present

## 2019-04-24 DIAGNOSIS — Y902 Blood alcohol level of 40-59 mg/100 ml: Secondary | ICD-10-CM | POA: Diagnosis present

## 2019-04-24 DIAGNOSIS — N390 Urinary tract infection, site not specified: Secondary | ICD-10-CM | POA: Diagnosis not present

## 2019-04-24 DIAGNOSIS — R0902 Hypoxemia: Secondary | ICD-10-CM | POA: Diagnosis not present

## 2019-04-24 DIAGNOSIS — G2581 Restless legs syndrome: Secondary | ICD-10-CM | POA: Diagnosis present

## 2019-04-24 DIAGNOSIS — H548 Legal blindness, as defined in USA: Secondary | ICD-10-CM | POA: Diagnosis present

## 2019-04-24 DIAGNOSIS — F29 Unspecified psychosis not due to a substance or known physiological condition: Secondary | ICD-10-CM | POA: Diagnosis not present

## 2019-04-24 DIAGNOSIS — Z781 Physical restraint status: Secondary | ICD-10-CM | POA: Diagnosis not present

## 2019-04-24 DIAGNOSIS — R4781 Slurred speech: Secondary | ICD-10-CM | POA: Diagnosis not present

## 2019-04-24 DIAGNOSIS — K219 Gastro-esophageal reflux disease without esophagitis: Secondary | ICD-10-CM | POA: Diagnosis present

## 2019-04-24 DIAGNOSIS — Z209 Contact with and (suspected) exposure to unspecified communicable disease: Secondary | ICD-10-CM | POA: Diagnosis not present

## 2019-04-24 DIAGNOSIS — Z7951 Long term (current) use of inhaled steroids: Secondary | ICD-10-CM

## 2019-04-24 DIAGNOSIS — Z03818 Encounter for observation for suspected exposure to other biological agents ruled out: Secondary | ICD-10-CM | POA: Diagnosis not present

## 2019-04-24 DIAGNOSIS — Z79899 Other long term (current) drug therapy: Secondary | ICD-10-CM

## 2019-04-24 DIAGNOSIS — Z881 Allergy status to other antibiotic agents status: Secondary | ICD-10-CM

## 2019-04-24 DIAGNOSIS — Z888 Allergy status to other drugs, medicaments and biological substances status: Secondary | ICD-10-CM

## 2019-04-24 DIAGNOSIS — R45851 Suicidal ideations: Secondary | ICD-10-CM | POA: Diagnosis present

## 2019-04-24 DIAGNOSIS — Y92009 Unspecified place in unspecified non-institutional (private) residence as the place of occurrence of the external cause: Secondary | ICD-10-CM

## 2019-04-24 DIAGNOSIS — J320 Chronic maxillary sinusitis: Secondary | ICD-10-CM | POA: Diagnosis present

## 2019-04-24 DIAGNOSIS — Z8674 Personal history of sudden cardiac arrest: Secondary | ICD-10-CM

## 2019-04-24 DIAGNOSIS — R0602 Shortness of breath: Secondary | ICD-10-CM | POA: Diagnosis not present

## 2019-04-24 DIAGNOSIS — F322 Major depressive disorder, single episode, severe without psychotic features: Secondary | ICD-10-CM | POA: Diagnosis not present

## 2019-04-24 DIAGNOSIS — R404 Transient alteration of awareness: Secondary | ICD-10-CM | POA: Diagnosis not present

## 2019-04-24 DIAGNOSIS — R4020 Unspecified coma: Secondary | ICD-10-CM | POA: Diagnosis not present

## 2019-04-24 DIAGNOSIS — Z880 Allergy status to penicillin: Secondary | ICD-10-CM

## 2019-04-24 LAB — CBC WITH DIFFERENTIAL/PLATELET
Abs Immature Granulocytes: 0.02 10*3/uL (ref 0.00–0.07)
Basophils Absolute: 0.1 10*3/uL (ref 0.0–0.1)
Basophils Relative: 1 %
Eosinophils Absolute: 0.2 10*3/uL (ref 0.0–0.5)
Eosinophils Relative: 2 %
HCT: 44.3 % (ref 36.0–46.0)
Hemoglobin: 14.2 g/dL (ref 12.0–15.0)
Immature Granulocytes: 0 %
Lymphocytes Relative: 16 %
Lymphs Abs: 1.2 10*3/uL (ref 0.7–4.0)
MCH: 30.7 pg (ref 26.0–34.0)
MCHC: 32.1 g/dL (ref 30.0–36.0)
MCV: 95.7 fL (ref 80.0–100.0)
Monocytes Absolute: 0.3 10*3/uL (ref 0.1–1.0)
Monocytes Relative: 5 %
Neutro Abs: 5.7 10*3/uL (ref 1.7–7.7)
Neutrophils Relative %: 76 %
Platelets: 274 10*3/uL (ref 150–400)
RBC: 4.63 MIL/uL (ref 3.87–5.11)
RDW: 12.5 % (ref 11.5–15.5)
WBC: 7.5 10*3/uL (ref 4.0–10.5)
nRBC: 0 % (ref 0.0–0.2)

## 2019-04-24 LAB — RAPID URINE DRUG SCREEN, HOSP PERFORMED
Amphetamines: POSITIVE — AB
Barbiturates: NOT DETECTED
Benzodiazepines: NOT DETECTED
Cocaine: NOT DETECTED
Opiates: NOT DETECTED
Tetrahydrocannabinol: NOT DETECTED

## 2019-04-24 LAB — COMPREHENSIVE METABOLIC PANEL
ALT: 15 U/L (ref 0–44)
AST: 15 U/L (ref 15–41)
Albumin: 3.7 g/dL (ref 3.5–5.0)
Alkaline Phosphatase: 62 U/L (ref 38–126)
Anion gap: 12 (ref 5–15)
BUN: 9 mg/dL (ref 6–20)
CO2: 17 mmol/L — ABNORMAL LOW (ref 22–32)
Calcium: 8.3 mg/dL — ABNORMAL LOW (ref 8.9–10.3)
Chloride: 112 mmol/L — ABNORMAL HIGH (ref 98–111)
Creatinine, Ser: 0.88 mg/dL (ref 0.44–1.00)
GFR calc Af Amer: >60 mL/min (ref >60)
GFR calc non Af Amer: >60 mL/min (ref >60)
Glucose, Bld: 83 mg/dL (ref 70–99)
Potassium: 3.3 mmol/L — ABNORMAL LOW (ref 3.5–5.1)
Sodium: 141 mmol/L (ref 135–145)
Total Bilirubin: 0.5 mg/dL (ref 0.3–1.2)
Total Protein: 6.3 g/dL — ABNORMAL LOW (ref 6.5–8.1)

## 2019-04-24 LAB — ETHANOL: Alcohol, Ethyl (B): 54 mg/dL — ABNORMAL HIGH (ref <10)

## 2019-04-24 LAB — TROPONIN I (HIGH SENSITIVITY): Troponin I (High Sensitivity): <2 ng/L (ref <18)

## 2019-04-24 MED ORDER — SODIUM CHLORIDE 0.9 % IV BOLUS
1000.0000 mL | Freq: Once | INTRAVENOUS | Status: AC
  Administered 2019-04-25: 1000 mL via INTRAVENOUS

## 2019-04-24 MED ORDER — SODIUM CHLORIDE 0.9 % IV BOLUS
2000.0000 mL | Freq: Once | INTRAVENOUS | Status: AC
  Administered 2019-04-24: 2000 mL via INTRAVENOUS

## 2019-04-24 NOTE — ED Provider Notes (Signed)
Care assumed from Dr. Roderic Palau.  The patient here after overdose of multiple medications including Benadryl, amlodipine, alcohol.  Unknown amounts of each.  She is somnolent but arouses to painful stimuli and protecting her airway.  Blood pressure 161 systolic.  EKG without prolonged QT.  She has irregular pupils which appear to be chronic.  Will obtain CT head.  She moves all extremities to pain.  Patient remains obtunded.  Blood pressure continues to drop into the 09U and 04V systolic.  Additional IV fluids given.  EKG shows normal intervals and QRS and QT.  Discussed with poison center.  Patient does have amlodipine on her medication list but does not know how much that she took.  We will start norepinephrine as well as give IV calcium.  Contractility appears normal on bedside echo so we will hold off on high-dose insulin at this time.  Will give IV calcium and start vasopressors.  Blood pressure improving to the 40J and 81X systolic on Levophed after calcium given.  Patient remains obtunded but protecting airway in response to pain.  No ICU beds available.  Discussed with critical care at [facility], Dr. Jimmy Footman requested ABG and accepts patient.  3:30 AM.  Patient awake and agitated and ripping off monitoring leads and ripping out IVs.  She is refusing to comply with care.  She is very upset that her suicide attempt was not successful.  She has slurred speech.  She stated she does not want be here.  She is not redirectable and trying to get out of bed.  She remains hypotensive.  IVC paperwork completed.  Patient given dose of Geodon for patient and staff safety.  Will start Precedex drip and try to wean off levophed.  Placed on soft restraints.  She is more calm after Geodon. Precedex not started at this time.  Blood pressure remains low in the 91Y systolic and Levophed restarted. Lactate is improving.  pH is normal on ABG. Protecting airway, no need for intubation at this time.   EMERGENCY DEPARTMENT [place] CARDIAC EXAM "Study: Limited Ultrasound of the Heart and Pericardium"  INDICATIONS:Abnormal vital signs Multiple views of the heart and pericardium were obtained in real-time with a multi-frequency probe.  PERFORMED NW:GNFAOZ IMAGES ARCHIVED?: Yes LIMITATIONS:  Body habitus VIEWS USED: Subcostal 4 chamber INTERPRETATION: Cardiac activity present, Pericardial effusion present, Cardiac tamponade absent and Normal contractility     CRITICAL CARE Performed by: Ezequiel Essex Total critical care time: 75 minutes Critical care time was exclusive of separately billable procedures and treating other patients. Critical care was necessary to treat or prevent imminent or life-threatening deterioration. Critical care was time spent personally by me on the following activities: development of treatment plan with patient and/or surrogate as well as nursing, discussions with consultants, evaluation of patient's response to treatment, examination of patient, obtaining history from patient or surrogate, ordering and performing treatments and interventions, ordering and review of laboratory studies, ordering and review of radiographic studies, pulse oximetry and re-evaluation of patient's condition.     EKG Interpretation  Date/Time:  Saturday April 24 2019 22:03:35 EDT Ventricular Rate:  75 PR Interval:    QRS Duration: 103 QT Interval:  388 QTC Calculation: 434 R Axis:   -37 Text Interpretation:  Sinus rhythm Borderline prolonged PR interval Left axis deviation Abnormal R-wave progression, early transition Consider anterior infarct No significant change was found Confirmed by Ezequiel Essex [phone]) on 04/24/2019 11:03:46 PM         Katarina Riebe, Annie Main, MD 04/25/19  0602  

## 2019-04-24 NOTE — ED Provider Notes (Signed)
[facility] EMERGENCY DEPARTMENT Provider Note   CSN: [id number] Arrival date & time: 04/24/19  2140     History   Chief Complaint Chief Complaint  Patient presents with  . Drug Overdose    HPI Sara Barnett is a 53 y.o. female.     Patient took an overdose of blood pressure medicines and benzos to kill herself.  The history is provided by the EMS personnel.  Drug Overdose This is a new problem. The current episode started 1 to 2 hours ago. The problem occurs constantly. The problem has not changed since onset.Pertinent negatives include no chest pain. Nothing aggravates the symptoms. Nothing relieves the symptoms. She has tried nothing for the symptoms.    Past Medical History:  Diagnosis Date  . Abnormally small mouth   . Anxiety   . Arthritis    knees  . Chronic ethmoidal sinusitis 07/2017  . Chronic maxillary sinusitis 07/2017  . Complication of anesthesia    anaphylaxis after Methylprednisolone injection - cardiac arrest/PEA  . 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 Constipation   . Depression   . Deviated nasal septum 07/2017  . GERD (gastroesophageal reflux disease)   . History of cardiac murmur    states no known problems, no cardiologist  . Hypertension    states under control with meds., has been on med. x 17 years  . Legally blind   . Nasal turbinate hypertrophy 07/2017  . Rash of back 08/18/2017  . Restless leg     Patient Active Problem List   Diagnosis Date Noted  . Therapeutic drug monitoring 10/12/2018  . Encounter for screening colonoscopy 07/09/2018  . RLS (restless legs syndrome) 03/16/2018  . Constipation 01/29/2017  . Breast tenderness 12/13/2015  . Fatigue 12/13/2015  . Hot flashes 12/13/2015  . Major depression 07/20/2014    Past Surgical History:  Procedure Laterality Date  . ABLATION ON ENDOMETRIOSIS    . BUNIONECTOMY Right   . CATARACT EXTRACTION, BILATERAL     age 54 year  . CESAREAN SECTION     x 2  . COLONOSCOPY  WITH PROPOFOL N/A 08/25/2018   Procedure: COLONOSCOPY WITH PROPOFOL;  Surgeon: Danie Binder, MD;  [facility];  Service: Endoscopy;  Laterality: N/A;  2:15pm  . ENDOSCOPIC CONCHA BULLOSA RESECTION Bilateral 08/25/2017   Procedure: BILATERAL ENDOSCOPIC CONCHA BULLOSA RESECTION;  Surgeon: Leta Baptist, MD;  Location: [place];  Service: ENT;  Laterality: Bilateral;  . ETHMOIDECTOMY Bilateral 08/25/2017   Procedure: BILATERAL TOTAL ETHMOIDECTOMY;  Surgeon: Leta Baptist, MD;  Location: [place];  Service: ENT;  Laterality: Bilateral;  . EYE SURGERY    . INTRAOCULAR LENS INSERTION Bilateral    age 26s  . LUMBAR FUSION  07/05/2004  . LUMBAR LAMINECTOMY  07/05/2004   L5-S1  . MAXILLARY ANTROSTOMY Bilateral 08/25/2017   Procedure: BILATERAL MAXILLARY ANTROSTOMY WITH TISSUE REMOVAL;  Surgeon: Leta Baptist, MD;  Location: [place];  Service: ENT;  Laterality: Bilateral;  . MEMBRANE PEEL Right 11/06/2011; 08/06/2013  . NASAL SEPTOPLASTY W/ TURBINOPLASTY Bilateral 08/25/2017   Procedure: NASAL SEPTOPLASTY WITH BILATERAL TURBINATE REDUCTION;  Surgeon: Leta Baptist, MD;  Location: [place];  Service: ENT;  Laterality: Bilateral;  . PARS PLANA VITRECTOMY Right 03/22/2011; 08/21/2011; 12/20/2011; 05/13/2012; 11/20/2012; 05/12/2013; 08/06/2013; 05/17/2016  . PARS PLANA VITRECTOMY W/ SCLERAL BUCKLE Left 04/21/2015  . PLANTAR FASCIA RELEASE Right   . SINUS ENDO W/FUSION Bilateral 08/25/2017   Procedure: ENDOSCOPIC SINUS  SURGERY WITH FUSION NAVIGATION;  Surgeon: Leta Baptist, MD;  Location: [place];  Service: ENT;  Laterality: Bilateral;  . TUBAL LIGATION       OB History    Gravida  2   Para  2   Term      Preterm      AB      Living  2     SAB      TAB      Ectopic      Multiple      Live Births               Home Medications    Prior to Admission medications   Medication Sig Start Date End Date Taking?  Authorizing Provider  amLODipine (NORVASC) 10 MG tablet Take 1 tablet by mouth daily. 02/11/19  Yes [provider]  benazepril (LOTENSIN) 40 MG tablet Take 1 tablet by mouth daily. 02/12/19  Yes [provider]  buPROPion (WELLBUTRIN SR) 100 MG 12 hr tablet Take 100 mg by mouth daily. 04/14/19  Yes [provider]  Cholecalciferol (VITAMIN D3) 2000 units TABS Take 2,000 Units by mouth daily.   Yes [provider]  clonazePAM (KLONOPIN) 0.5 MG tablet Take 0.5 mg by mouth 2 (two) times daily as needed for anxiety.   Yes [provider]  Ferrous Gluconate (IRON) 240 (27 Fe) MG TABS Take 240 mg by mouth daily.   Yes [provider]  fexofenadine-pseudoephedrine (ALLEGRA-D) 60-120 MG 12 hr tablet Take 1 tablet by mouth daily as needed (for allergies).   Yes [provider]  fluticasone (FLONASE) 50 MCG/ACT nasal spray Place 2 sprays into both nostrils daily as needed for allergies.  02/24/18  Yes [provider]  guaiFENesin (MUCINEX) 600 MG 12 hr tablet Take 600 mg by mouth daily as needed for cough.    Yes [provider]  lansoprazole (PREVACID) 30 MG capsule Take 30 mg by mouth daily.    Yes [provider]  linaclotide Rolan Lipa) 145 MCG CAPS capsule Take 1 capsule (145 mcg total) by mouth daily before breakfast. 01/22/19  Yes Annitta Needs, NP  linaclotide [facility]) 290 MCG CAPS capsule Take 1 capsule (290 mcg total) by mouth daily before breakfast. 01/23/18  Yes Annitta Needs, NP  Multiple Vitamin (MULTIVITAMIN) tablet Take 1 tablet by mouth daily.   Yes [provider]  Omega-3 Fatty Acids (SALMON OIL-1000 PO) Take 2,000 mg by mouth daily.   Yes [provider]  OVER THE COUNTER MEDICATION Take 2 tablets by mouth daily. Hair Growth Supplement   Yes [provider]  phentermine (ADIPEX-P) 37.5 MG tablet Take 37.5 mg by mouth daily.  03/09/18  Yes [provider]  pramipexole  (MIRAPEX) 0.125 MG tablet TAKE 3 TABLETS BY MOUTH IN THE EVENING TAKE  90-120  MINUTES  BEFORE  BEDTIME 01/11/19  Yes Kathrynn Ducking, MD  prednisoLONE acetate (PRED FORTE) 1 % ophthalmic suspension Place 1 drop into the right eye 2 (two) times a day. 04/23/19  Yes [provider]  sulfamethoxazole-trimethoprim (BACTRIM DS,SEPTRA DS) 800-160 MG tablet Take 1 tablet by mouth 2 (two) times daily.   Yes [provider]  vitamin C (ASCORBIC ACID) 500 MG tablet Take 500 mg by mouth daily.   Yes [provider]  amLODipine-benazepril (LOTREL) 10-40 MG per capsule Take 1 capsule by mouth daily.    [provider]    Family History Family History  Problem Relation Age of Onset  . Depression Mother   . Cancer Father        prostate  . Hypertension Father   . Hearing loss Father   . Other Father        dialysis  . Vision loss Daughter        cataracts  . Stroke Maternal Grandmother   . Other Paternal Grandmother        lung issues  . Vision loss Daughter        cataracts  . Colon cancer Neg Hx   . Colon polyps Neg Hx     Social History Social History   Tobacco Use  . Smoking status: Former Smoker    Packs/day: 3.00    Years: 15.00    Pack years: 45.00    Types: Cigarettes    Quit date: 05/24/2009    Years since quitting: 9.9  . Smokeless tobacco: Never Used  Substance Use Topics  . Alcohol use: Yes    Comment: occasionally  . Drug use: No     Allergies   Methylprednisolone, Augmentin [amoxicillin-pot clavulanate], Doxycycline, Hydrocodone-acetaminophen, Statins, and Other   Review of Systems Review of Systems  Unable to perform ROS: Mental status change  Cardiovascular: Negative for chest pain.     Physical Exam Updated Vital Signs BP 100/71   Pulse 75   Temp (!) 97.5 F (36.4 C) (Oral)   Resp 16   SpO2 93%   Physical Exam Vitals signs and nursing note reviewed.  Constitutional:      Appearance: She is well-developed.   HENT:     Head: Normocephalic.     Right Ear: Ear canal normal.     Nose: Nose normal.     Mouth/Throat:     Mouth: Mucous membranes are moist.  Eyes:     General: No scleral icterus.    Conjunctiva/sclera: Conjunctivae normal.  Neck:     Musculoskeletal: Neck supple.     Thyroid: No thyromegaly.  Cardiovascular:     Rate and Rhythm: Regular rhythm.     Heart sounds: No murmur. No friction rub. No gallop.      Comments: Tachycardic Pulmonary:     Breath sounds: No stridor. No wheezing or rales.  Chest:     Chest wall: No tenderness.  Abdominal:     General: There is no distension.     Tenderness: There is no abdominal tenderness. There is no rebound.  Musculoskeletal: Normal range of motion.  Lymphadenopathy:     Cervical: No cervical adenopathy.  Skin:    Capillary Refill: Capillary refill takes less than 2 seconds.     Findings: No erythema or rash.  Neurological:     General: No focal deficit present.     Motor: No abnormal muscle tone.     Coordination: Coordination normal.     Comments: Patient will speak when given painful stimuli but does not answer questions appropriately.  She is protecting her airway      ED Treatments / Results  Labs (all labs ordered are listed, but only abnormal results are displayed) Labs Reviewed  CBC WITH DIFFERENTIAL/PLATELET  COMPREHENSIVE METABOLIC PANEL  ETHANOL  TROPONIN I (HIGH SENSITIVITY)  TROPONIN I (HIGH SENSITIVITY)  URINALYSIS, ROUTINE W REFLEX MICROSCOPIC  RAPID URINE DRUG SCREEN, HOSP PERFORMED  LACTIC ACID, PLASMA  ACETAMINOPHEN LEVEL  SALICYLATE LEVEL    EKG EKG Interpretation  Date/Time:  Saturday April 24 2019 22:03:35 EDT Ventricular Rate:  75 PR Interval:  QRS Duration: 103 QT Interval:  388 QTC Calculation: 434 R Axis:   -37 Text Interpretation:  Sinus rhythm Borderline prolonged PR interval Left axis deviation Abnormal R-wave progression, early transition Consider anterior infarct No significant  change was found Confirmed by Ezequiel Essex [id number]) on 04/24/2019 11:03:46 PM   Radiology Dg Chest Portable 1 View  Result Date: 04/24/2019 CLINICAL DATA:  53 year old female with shortness of breath EXAM: PORTABLE CHEST 1 VIEW COMPARISON:  Chest radiograph dated 06/27/2015 FINDINGS: Diffuse interstitial coarsening. No focal consolidation, pleural effusion, or pneumothorax. The cardiac silhouette is within normal limits. No acute osseous pathology. IMPRESSION: No active disease. Electronically Signed   By: Anner Crete M.D.   On: 04/24/2019 23:01    Procedures Procedures (including critical care time)  Medications Ordered in ED Medications  sodium chloride 0.9 % bolus 2,000 mL (2,000 mLs Intravenous New Bag/Given 04/24/19 2221)     Initial Impression / Assessment and Plan / ED Course  I have reviewed the triage vital signs and the nursing notes.  Pertinent labs & imaging results that were available during my care of the patient were reviewed by me and considered in my medical decision making (see chart for details).    CRITICAL CARE Performed by: Milton Ferguson Total critical care time: 35 minutes Critical care time was exclusive of separately billable procedures and treating other patients. Critical care was necessary to treat or prevent imminent or life-threatening deterioration. Critical care was time spent personally by me on the following activities: development of treatment plan with patient and/or surrogate as well as nursing, discussions with consultants, evaluation of patient's response to treatment, examination of patient, obtaining history from patient or surrogate, ordering and performing treatments and interventions, ordering and review of laboratory studies, ordering and review of radiographic studies, pulse oximetry and re-evaluation of patient's condition.     Patient with overdose of benzos and blood pressure medicines.  Initially hypotensive but responding to  fluids.  Labs and x-rays pending  Final Clinical Impressions(s) / ED Diagnoses   Final diagnoses:  None    ED Discharge Orders    None       Milton Ferguson, MD 04/27/19 1327

## 2019-04-24 NOTE — ED Notes (Signed)
Pt states that she is wanting to kill herself

## 2019-04-24 NOTE — ED Notes (Signed)
Dr Wyvonnia Dusky aware of bp, additional orders given,

## 2019-04-24 NOTE — ED Triage Notes (Signed)
Pt texted her eye doctor and thanked them for taking care of her during her life. Eye doctor contacted 911 and had a welfare check performed, upon pd arrival to residence pt was lethargic, hypotensive with systolic bp of 70. Admitted to ems that she wanted to kill herself several times enroute to er, upon arrival to er pt speech slurred, difficult to understand, will arouse with painful stimuli, Dr Roderic Palau at bedside,

## 2019-04-25 DIAGNOSIS — R579 Shock, unspecified: Secondary | ICD-10-CM | POA: Diagnosis present

## 2019-04-25 DIAGNOSIS — K219 Gastro-esophageal reflux disease without esophagitis: Secondary | ICD-10-CM | POA: Diagnosis present

## 2019-04-25 DIAGNOSIS — F329 Major depressive disorder, single episode, unspecified: Secondary | ICD-10-CM | POA: Diagnosis present

## 2019-04-25 DIAGNOSIS — T510X2A Toxic effect of ethanol, intentional self-harm, initial encounter: Secondary | ICD-10-CM | POA: Diagnosis present

## 2019-04-25 DIAGNOSIS — T50902A Poisoning by unspecified drugs, medicaments and biological substances, intentional self-harm, initial encounter: Secondary | ICD-10-CM

## 2019-04-25 DIAGNOSIS — K5909 Other constipation: Secondary | ICD-10-CM | POA: Diagnosis present

## 2019-04-25 DIAGNOSIS — H548 Legal blindness, as defined in USA: Secondary | ICD-10-CM | POA: Diagnosis present

## 2019-04-25 DIAGNOSIS — Z20828 Contact with and (suspected) exposure to other viral communicable diseases: Secondary | ICD-10-CM | POA: Diagnosis present

## 2019-04-25 DIAGNOSIS — F419 Anxiety disorder, unspecified: Secondary | ICD-10-CM | POA: Diagnosis present

## 2019-04-25 DIAGNOSIS — N390 Urinary tract infection, site not specified: Secondary | ICD-10-CM | POA: Diagnosis not present

## 2019-04-25 DIAGNOSIS — J320 Chronic maxillary sinusitis: Secondary | ICD-10-CM | POA: Diagnosis present

## 2019-04-25 DIAGNOSIS — G2581 Restless legs syndrome: Secondary | ICD-10-CM | POA: Diagnosis present

## 2019-04-25 DIAGNOSIS — R45851 Suicidal ideations: Secondary | ICD-10-CM | POA: Diagnosis present

## 2019-04-25 DIAGNOSIS — Z751 Person awaiting admission to adequate facility elsewhere: Secondary | ICD-10-CM | POA: Diagnosis not present

## 2019-04-25 DIAGNOSIS — R578 Other shock: Secondary | ICD-10-CM | POA: Diagnosis not present

## 2019-04-25 DIAGNOSIS — T1491XA Suicide attempt, initial encounter: Secondary | ICD-10-CM | POA: Diagnosis not present

## 2019-04-25 DIAGNOSIS — T461X2A Poisoning by calcium-channel blockers, intentional self-harm, initial encounter: Secondary | ICD-10-CM | POA: Diagnosis present

## 2019-04-25 DIAGNOSIS — J322 Chronic ethmoidal sinusitis: Secondary | ICD-10-CM | POA: Diagnosis present

## 2019-04-25 DIAGNOSIS — I10 Essential (primary) hypertension: Secondary | ICD-10-CM | POA: Diagnosis present

## 2019-04-25 DIAGNOSIS — Z781 Physical restraint status: Secondary | ICD-10-CM | POA: Diagnosis not present

## 2019-04-25 DIAGNOSIS — T450X2A Poisoning by antiallergic and antiemetic drugs, intentional self-harm, initial encounter: Secondary | ICD-10-CM | POA: Diagnosis present

## 2019-04-25 DIAGNOSIS — I313 Pericardial effusion (noninflammatory): Secondary | ICD-10-CM | POA: Diagnosis present

## 2019-04-25 DIAGNOSIS — T424X2A Poisoning by benzodiazepines, intentional self-harm, initial encounter: Secondary | ICD-10-CM | POA: Diagnosis present

## 2019-04-25 DIAGNOSIS — Y92009 Unspecified place in unspecified non-institutional (private) residence as the place of occurrence of the external cause: Secondary | ICD-10-CM | POA: Diagnosis not present

## 2019-04-25 DIAGNOSIS — Z87891 Personal history of nicotine dependence: Secondary | ICD-10-CM | POA: Diagnosis not present

## 2019-04-25 DIAGNOSIS — Y902 Blood alcohol level of 40-59 mg/100 ml: Secondary | ICD-10-CM | POA: Diagnosis present

## 2019-04-25 DIAGNOSIS — Z818 Family history of other mental and behavioral disorders: Secondary | ICD-10-CM | POA: Diagnosis not present

## 2019-04-25 DIAGNOSIS — Z8674 Personal history of sudden cardiac arrest: Secondary | ICD-10-CM | POA: Diagnosis not present

## 2019-04-25 DIAGNOSIS — E162 Hypoglycemia, unspecified: Secondary | ICD-10-CM | POA: Diagnosis not present

## 2019-04-25 LAB — BLOOD GAS, ARTERIAL
Acid-base deficit: 8.4 mmol/L — ABNORMAL HIGH (ref 0.0–2.0)
Bicarbonate: 17.7 mmol/L — ABNORMAL LOW (ref 20.0–28.0)
FIO2: 28
O2 Saturation: 93.2 %
Patient temperature: 37
pCO2 arterial: 33.3 mmHg (ref 32.0–48.0)
pH, Arterial: 7.316 — ABNORMAL LOW (ref 7.350–7.450)
pO2, Arterial: 74.6 mmHg — ABNORMAL LOW (ref 83.0–108.0)

## 2019-04-25 LAB — GLUCOSE, CAPILLARY
Glucose-Capillary: 88 mg/dL (ref 70–99)
Glucose-Capillary: 62 mg/dL — ABNORMAL LOW (ref 70–99)
Glucose-Capillary: 183 mg/dL — ABNORMAL HIGH (ref 70–99)
Glucose-Capillary: 64 mg/dL — ABNORMAL LOW (ref 70–99)

## 2019-04-25 LAB — URINALYSIS, ROUTINE W REFLEX MICROSCOPIC
Bilirubin Urine: NEGATIVE
Glucose, UA: NEGATIVE mg/dL
Hgb urine dipstick: NEGATIVE
Ketones, ur: NEGATIVE mg/dL
Leukocytes,Ua: NEGATIVE
Nitrite: NEGATIVE
Protein, ur: NEGATIVE mg/dL
Specific Gravity, Urine: 1.004 — ABNORMAL LOW (ref 1.005–1.030)
pH: 5 (ref 5.0–8.0)

## 2019-04-25 LAB — CBC
HCT: 46.5 % — ABNORMAL HIGH (ref 36.0–46.0)
Hemoglobin: 15.8 g/dL — ABNORMAL HIGH (ref 12.0–15.0)
MCH: 30.7 pg (ref 26.0–34.0)
MCHC: 34 g/dL (ref 30.0–36.0)
MCV: 90.3 fL (ref 80.0–100.0)
Platelets: 255 10*3/uL (ref 150–400)
RBC: 5.15 MIL/uL — ABNORMAL HIGH (ref 3.87–5.11)
RDW: 11.8 % (ref 11.5–15.5)
WBC: 8.2 10*3/uL (ref 4.0–10.5)
nRBC: 0 % (ref 0.0–0.2)

## 2019-04-25 LAB — SALICYLATE LEVEL: Salicylate Lvl: <7 mg/dL (ref 2.8–30.0)

## 2019-04-25 LAB — TROPONIN I (HIGH SENSITIVITY): Troponin I (High Sensitivity): <2 ng/L (ref <18)

## 2019-04-25 LAB — LACTIC ACID, PLASMA
Lactic Acid, Venous: 3.8 mmol/L (ref 0.5–1.9)
Lactic Acid, Venous: 2.6 mmol/L (ref 0.5–1.9)
Lactic Acid, Venous: 2.3 mmol/L (ref 0.5–1.9)

## 2019-04-25 LAB — CREATININE, SERUM
Creatinine, Ser: 0.71 mg/dL (ref 0.44–1.00)
GFR calc Af Amer: >60 mL/min (ref >60)
GFR calc non Af Amer: >60 mL/min (ref >60)

## 2019-04-25 LAB — MRSA PCR SCREENING: MRSA by PCR: NEGATIVE

## 2019-04-25 LAB — SARS CORONAVIRUS 2 BY RT PCR (HOSPITAL ORDER, PERFORMED IN ~~LOC~~ HOSPITAL LAB): SARS Coronavirus 2: NEGATIVE

## 2019-04-25 LAB — ACETAMINOPHEN LEVEL: Acetaminophen (Tylenol), Serum: <10 ug/mL — ABNORMAL LOW (ref 10–30)

## 2019-04-25 MED ORDER — INSULIN ASPART 100 UNIT/ML ~~LOC~~ SOLN: 0.0000 [IU] | SUBCUTANEOUS | Status: DC

## 2019-04-25 MED ORDER — SODIUM CHLORIDE 0.9 % IV SOLN: INTRAVENOUS | Status: DC

## 2019-04-25 MED ORDER — CALCIUM GLUCONATE 10 % IV SOLN
1.0000 g | Freq: Once | INTRAVENOUS | Status: DC
  Filled 2019-04-25: qty 10

## 2019-04-25 MED ORDER — CALCIUM GLUCONATE-NACL 1-0.675 GM/50ML-% IV SOLN
INTRAVENOUS | Status: AC
  Filled 2019-04-25: qty 50

## 2019-04-25 MED ORDER — ZIPRASIDONE MESYLATE 20 MG IM SOLR
20.0000 mg | Freq: Once | INTRAMUSCULAR | Status: AC
  Administered 2019-04-25: 20 mg via INTRAMUSCULAR
  Filled 2019-04-25: qty 20

## 2019-04-25 MED ORDER — NOREPINEPHRINE 4 MG/250ML-% IV SOLN
2.0000 ug/min | INTRAVENOUS | Status: DC
  Administered 2019-04-25: 01:00:00 2 ug/min via INTRAVENOUS
  Filled 2019-04-25: qty 250

## 2019-04-25 MED ORDER — SODIUM CHLORIDE 0.9 % IV BOLUS
1000.0000 mL | Freq: Once | INTRAVENOUS | Status: AC
  Administered 2019-04-25: 1000 mL via INTRAVENOUS

## 2019-04-25 MED ORDER — LORAZEPAM 2 MG/ML IJ SOLN
1.0000 mg | Freq: Once | INTRAMUSCULAR | Status: AC
  Administered 2019-04-25: 1 mg via INTRAVENOUS

## 2019-04-25 MED ORDER — ENOXAPARIN SODIUM 40 MG/0.4ML ~~LOC~~ SOLN
40.0000 mg | SUBCUTANEOUS | Status: DC
  Filled 2019-04-25: qty 0.4

## 2019-04-25 MED ORDER — DEXTROSE 50 % IV SOLN
INTRAVENOUS | Status: AC
  Administered 2019-04-25: 20:00:00 25 mL
  Filled 2019-04-25: qty 50

## 2019-04-25 MED ORDER — SODIUM CHLORIDE 0.9 % IV SOLN
1.0000 g | Freq: Once | INTRAVENOUS | Status: AC
  Administered 2019-04-25: 1 g via INTRAVENOUS
  Filled 2019-04-25: qty 10

## 2019-04-25 MED ORDER — LORAZEPAM 2 MG/ML IJ SOLN
INTRAMUSCULAR | Status: AC
  Filled 2019-04-25: qty 1

## 2019-04-25 MED ORDER — SODIUM CHLORIDE 0.9 % IV SOLN: 250.0000 mL | INTRAVENOUS | Status: DC

## 2019-04-25 MED ORDER — DEXTROSE-NACL 5-0.9 % IV SOLN
INTRAVENOUS | Status: DC
  Administered 2019-04-25: 22:00:00 via INTRAVENOUS

## 2019-04-25 MED ORDER — PANTOPRAZOLE SODIUM 40 MG PO TBEC
40.0000 mg | DELAYED_RELEASE_TABLET | Freq: Every day | ORAL | Status: DC
  Administered 2019-04-27 – 2019-04-30 (×4): 40 mg via ORAL
  Filled 2019-04-25 (×5): qty 1

## 2019-04-25 MED ORDER — DEXMEDETOMIDINE HCL IN NACL 200 MCG/50ML IV SOLN
0.4000 ug/kg/h | INTRAVENOUS | Status: DC
  Filled 2019-04-25 (×2): qty 50

## 2019-04-25 MED ORDER — ONDANSETRON HCL 4 MG/2ML IJ SOLN: 4.0000 mg | Freq: Four times a day (QID) | INTRAMUSCULAR | Status: DC | PRN

## 2019-04-25 MED ORDER — DEXTROSE 50 % IV SOLN
INTRAVENOUS | Status: AC
  Administered 2019-04-25: 16:00:00
  Filled 2019-04-25: qty 50

## 2019-04-25 MED ORDER — ACETAMINOPHEN 325 MG PO TABS
650.0000 mg | ORAL_TABLET | ORAL | Status: DC | PRN
  Administered 2019-05-01: 650 mg via ORAL
  Filled 2019-04-25: qty 2

## 2019-04-25 MED ORDER — SODIUM CHLORIDE 0.9 % IV SOLN: 1.0000 g | Freq: Once | INTRAVENOUS | Status: DC

## 2019-04-25 MED ORDER — PREDNISOLONE ACETATE 1 % OP SUSP
1.0000 [drp] | Freq: Two times a day (BID) | OPHTHALMIC | Status: DC
  Administered 2019-04-26 – 2019-04-30 (×9): 1 [drp] via OPHTHALMIC
  Filled 2019-04-25: qty 5

## 2019-04-25 MED ORDER — STERILE WATER FOR INJECTION IJ SOLN
INTRAMUSCULAR | Status: AC
  Filled 2019-04-25: qty 10

## 2019-04-25 MED ORDER — VITAMIN C 500 MG PO TABS
500.0000 mg | ORAL_TABLET | Freq: Every day | ORAL | Status: DC
  Administered 2019-04-27 – 2019-04-30 (×4): 500 mg via ORAL
  Filled 2019-04-25 (×5): qty 1

## 2019-04-25 NOTE — ED Notes (Signed)
Pt took unknown amounts of ? Benadryl, amlodipine, clonazepam and wine tonight Bottle that were found with pt were 1 bottle of amlodipine that was not opened One bottle of 10 mg amlodipine  that was filled on 02-11-2019 for qty of 90 with 12 left One bottle of benadryl 25 mg tablets with 52 tablets left One bottle of clonazepam 0.5 mg tablets that was filled on 04-13-2019 for qty of 60 and bottle was empty.

## 2019-04-25 NOTE — ED Notes (Signed)
IVC paper taken out by Dr Wyvonnia Dusky. IVC paperwork faxed @ [phone], spoke with Magistrate who states that paperwork would be taken to [facility] office promptly.

## 2019-04-25 NOTE — ED Notes (Signed)
Attempted to contact [facility] for transport @ [place] til 0355. [place] called also, was able to finally get in touch with someone.

## 2019-04-25 NOTE — ED Notes (Signed)
Date and time results received: 04/25/19 00;28 (use smartphrase ".now" to insert current time)  Test: lactic acid Critical Value: 3.8  Name of Provider Notified: Dr Wyvonnia Dusky  Orders Received? Or Actions Taken?: none

## 2019-04-25 NOTE — ED Notes (Signed)
CRITICAL VALUE ALERT  Critical Value:   Lactic acid 2.3  Date & Time Notied:  04/25/19 0430  Provider Notified: EDP Ranc[place]   Orders Received/Actions taken: No orders at this time

## 2019-04-25 NOTE — ED Notes (Signed)
Report given to [facility]. 

## 2019-04-25 NOTE — Progress Notes (Signed)
[facility] Physician-Brief Progress Note Patient Name: Sara Barnett DOB: [birth year] MRN: [id number]   Date of Service  04/25/2019  HPI/Events of Note  CBG < 80. Drug OD. Cr normal.   eICU Interventions  D5 NS at 75 ml/hr oredered. Follow AM labs.      Intervention Category Minor Interventions: Routine modifications to care plan (e.g. PRN medications for pain, fever)  Elmer Sow 04/25/2019, 8:52 PM

## 2019-04-25 NOTE — ED Notes (Signed)
RN spoke with Andee Poles at [employer] who advised to monitor pt for hypotension, bradycardia, anticholinergenic symptoms, seizures, Poison control faxed paperwork for amlodipine overdose management.

## 2019-04-25 NOTE — ED Notes (Signed)
CRITICAL VALUE ALERT  Critical Value:  Lactic Acid 2.6  Date & Time Notied:  04/25/19 0240  Provider Notified: EDP Rancour   Orders Received/Actions taken: No orders at this time

## 2019-04-25 NOTE — ED Notes (Signed)
Pt calmer, sitter at bedside,

## 2019-04-25 NOTE — ED Notes (Signed)
[facility] here to transport patient. 

## 2019-04-25 NOTE — Progress Notes (Signed)
Received call from poison control for pt update. Informed caller of  ecg intervals PR 0.26 QRS 0.07 QT 0.34,  vss, treatments for hypoglycemic events, no current maintenance IV fluids and next scheduled lab draw

## 2019-04-25 NOTE — Progress Notes (Addendum)
Safety sitter at bedside. Pt sleeping calmly rr unlabored, vss.

## 2019-04-25 NOTE — ED Notes (Signed)
Rn called into room by sitter, pt sitting up in bed, cursing at staff, upset stating " why the hell am I still here" RN explained to pt that her friend had called 911 to check on her after getting her text. Pt states " she is not a friend of mine, I dont want to live" , pt pulled an iv out, pulled ekg leads off, soft restraints applied, Dr Wyvonnia Dusky at bedside,

## 2019-04-25 NOTE — H&P (Signed)
NAME:  Sara Barnett, MRN:  [id number], DOB:  1966/08/12, LOS: 0 ADMISSION DATE:  04/24/2019, CONSULTATION DATE:  04/25/19 REFERRING MD:  ER, CHIEF COMPLAINT:  Overdose   Brief History   53 year old woman w/ hx of depression presenting after intentional overdose.  History of present illness   53 year old woman with hx anxiety/depression, GERD, HTN, RLS presenting after intentional overdose of some combination of benadryl, amlodipine, clonazepam, alcohol.  Hypotensive and obtunded at outside ER but then woke up agitated and required IRS, transferred here for ongoing shock.  When asked how she feels she states "like shit with all this shit hooked up to me."  She denies pain or dyspnea.  On minimal levophed.  Utox with amphetamines otherwise neg, EtOH 54.  Initial EKG with benign intervals.  Past Medical History   Patient Active Problem List   Diagnosis Date Noted  . Drug overdose, intentional self-harm, initial encounter ([place]) 04/25/2019  . Therapeutic drug monitoring 10/12/2018  . Encounter for screening colonoscopy 07/09/2018  . RLS (restless legs syndrome) 03/16/2018  . Constipation 01/29/2017  . Breast tenderness 12/13/2015  . Fatigue 12/13/2015  . Hot flashes 12/13/2015  . Major depression 07/20/2014     Significant Hospital Events   04/25/19 admission  Consults:  None  Procedures:  None  Significant Diagnostic Tests:  None  Micro Data:  None  Antimicrobials:  None   Interim history/subjective:  Admitted  Objective   Blood pressure 108/70, pulse 71, temperature (!) 97.5 F (36.4 C), temperature source Oral, resp. rate 18, height 5\' 1"  (1.549 m), weight 74 kg, SpO2 95 %.        Intake/Output Summary (Last 24 hours) at 04/25/2019 1121 Last data filed at 04/25/2019 7353 Gross per 24 hour  Intake 2000 ml  Output 2050 ml  Net -50 ml   Filed Weights   04/25/19 0354  Weight: 74 kg    Examination: GEN: somenolent middle aged woman in NAD HEENT: pupils  dilated, MMM CV: RRR, soft SEM, ext warm PULM: Clear, no accessory muscle use GI: Soft, +BS EXT: No edema NEURO: Moves all 4 ext when prompted repeatedly PSYCH: obstinate but AOx3 SKIN: No rashes   Resolved Hospital Problem list   NA  Assessment & Plan:  # Intentional overdose with shock- question of CCB overdose.  Given calcium and 4L fluids as well as geodon and ativan prior to transfer.  EKG reassuring. # Underlying depression/anxiety # GERD # HTN  -Hold all home antihypertensives -Wean pressors to maintain MAP > 65 -Hold sedating meds, reorient, suicide precautions -Mental health c/s in AM  Best practice:  Diet: NPO Pain/Anxiety/Delirium protocol (if indicated): None VAP protocol (if indicated): None DVT prophylaxis: Lovenox GI prophylaxis: PTA PPI Glucose control: SSI Mobility: Bedrest Code Status: Full Family Communication: Will reach out Disposition: ICU  Labs   CBC: Recent Labs  Lab 04/24/19 2234  WBC 7.5  NEUTROABS 5.7  HGB 14.2  HCT 44.3  MCV 95.7  PLT 299    Basic Metabolic Panel: Recent Labs  Lab 04/24/19 2234  NA 141  K 3.3*  CL 112*  CO2 17*  GLUCOSE 83  BUN 9  CREATININE 0.88  CALCIUM 8.3*   GFR: Estimated Creatinine Clearance: 69.6 mL/min (by C-G formula based on SCr of 0.88 mg/dL). Recent Labs  Lab 04/24/19 2234 04/24/19 2328 04/25/19 0209 04/25/19 0354  WBC 7.5  --   --   --   LATICACIDVEN  --  3.8* 2.6* 2.3*  Liver Function Tests: Recent Labs  Lab 04/24/19 2234  AST 15  ALT 15  ALKPHOS 62  BILITOT 0.5  PROT 6.3*  ALBUMIN 3.7   No results for input(s): LIPASE, AMYLASE in the last 168 hours. No results for input(s): AMMONIA in the last 168 hours.  ABG    Component Value Date/Time   PHART 7.316 (L) 04/25/2019 0207   PCO2ART 33.3 04/25/2019 0207   PO2ART 74.6 (L) 04/25/2019 0207   HCO3 17.7 (L) 04/25/2019 0207   ACIDBASEDEF 8.4 (H) 04/25/2019 0207   O2SAT 93.2 04/25/2019 0207     Coagulation Profile:  No results for input(s): INR, PROTIME in the last 168 hours.  Cardiac Enzymes: No results for input(s): CKTOTAL, CKMB, CKMBINDEX, TROPONINI in the last 168 hours.  HbA1C: No results found for: HGBA1C  CBG: No results for input(s): GLUCAP in the last 168 hours.  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   Surgical History    Past Surgical History:  Procedure Laterality Date  . ABLATION ON ENDOMETRIOSIS    . BUNIONECTOMY Right   . CATARACT EXTRACTION, BILATERAL     age 70 year  . CESAREAN SECTION     x 2  . COLONOSCOPY WITH PROPOFOL N/A 08/25/2018   Procedure: COLONOSCOPY WITH PROPOFOL;  Surgeon: Danie Binder, MD;  [facility];  Service: Endoscopy;  Laterality: N/A;  2:15pm  . ENDOSCOPIC CONCHA BULLOSA RESECTION Bilateral 08/25/2017   Procedure: BILATERAL ENDOSCOPIC CONCHA BULLOSA RESECTION;  Surgeon: Leta Baptist, MD;  Location: [place];  Service: ENT;  Laterality: Bilateral;  . ETHMOIDECTOMY Bilateral 08/25/2017   Procedure: BILATERAL TOTAL ETHMOIDECTOMY;  Surgeon: Leta Baptist, MD;  Location: [place];  Service: ENT;  Laterality: Bilateral;  . EYE SURGERY    . INTRAOCULAR LENS INSERTION Bilateral    age 38s  . LUMBAR FUSION  07/05/2004  . LUMBAR LAMINECTOMY  07/05/2004   L5-S1  . MAXILLARY ANTROSTOMY Bilateral 08/25/2017   Procedure: BILATERAL MAXILLARY ANTROSTOMY WITH TISSUE REMOVAL;   Surgeon: Leta Baptist, MD;  Location: [place];  Service: ENT;  Laterality: Bilateral;  . MEMBRANE PEEL Right 11/06/2011; 08/06/2013  . NASAL SEPTOPLASTY W/ TURBINOPLASTY Bilateral 08/25/2017   Procedure: NASAL SEPTOPLASTY WITH BILATERAL TURBINATE REDUCTION;  Surgeon: Leta Baptist, MD;  Location: [place];  Service: ENT;  Laterality: Bilateral;  . PARS PLANA VITRECTOMY Right 03/22/2011; 08/21/2011; 12/20/2011; 05/13/2012; 11/20/2012; 05/12/2013; 08/06/2013; 05/17/2016  . PARS PLANA VITRECTOMY W/ SCLERAL BUCKLE Left 04/21/2015  . PLANTAR FASCIA RELEASE Right   . SINUS ENDO W/FUSION Bilateral 08/25/2017   Procedure: ENDOSCOPIC SINUS SURGERY WITH FUSION NAVIGATION;  Surgeon: Leta Baptist, MD;  Location: [facility];  Service: ENT;  Laterality: Bilateral;  . TUBAL LIGATION       Social History   reports that she quit smoking about 9 years ago. Her smoking use included cigarettes. She has a 45.00 pack-year smoking history. She has never used smokeless tobacco. She reports current alcohol use. She reports that she does not use drugs.   Family History  Her family history includes Cancer in her father; Depression in her mother; Hearing loss in her father; Hypertension in her father; Other in her father and paternal grandmother; Stroke in her maternal grandmother; Vision loss in her daughter and daughter. There is no history of Colon cancer or Colon polyps.   Allergies Allergies  Allergen Reactions  . Methylprednisolone Anaphylaxis and Other (See Comments)    CARDIAC ARREST/PEA   . Augmentin [Amoxicillin-Pot Clavulanate] Other (See Comments)    "FEELING HOT"; EXTREME SLEEPINESS  . Doxycycline Itching and Other (See Comments)    THROAT SWELLING  . Hydrocodone-Acetaminophen Itching and Other (See Comments)    THROAT SWELLING  . Statins Other (See Comments)    JOINT PAIN, swellling  . Other Other (See Comments)    Steroids - Unknown drug and strength, stopped  patient's heart     Home Medications  Prior to Admission medications   Medication Sig Start Date End Date Taking? Authorizing Provider  amLODipine (NORVASC) 10 MG tablet Take 1 tablet by mouth daily. 02/11/19  Yes [provider]  benazepril (LOTENSIN) 40 MG tablet Take 1 tablet by mouth daily. 02/12/19  Yes [provider]  buPROPion (WELLBUTRIN SR) 100 MG 12 hr tablet Take 100 mg by mouth daily. 04/14/19  Yes [provider]  Cholecalciferol (VITAMIN D3) 2000 units TABS Take 2,000 Units by mouth daily.   Yes [provider]  clonazePAM (KLONOPIN) 0.5 MG tablet Take 0.5 mg by mouth 2 (two) times daily as needed for anxiety.   Yes [provider]  Ferrous Gluconate (IRON) 240 (27 Fe) MG TABS Take 240 mg by mouth daily.   Yes [provider]  fexofenadine-pseudoephedrine (ALLEGRA-D) 60-120 MG 12 hr tablet Take 1 tablet by mouth daily as needed (for allergies).   Yes [provider]  fluticasone (FLONASE) 50 MCG/ACT nasal spray Place 2 sprays into both nostrils daily as needed for allergies.  02/24/18  Yes [provider]  guaiFENesin (MUCINEX) 600 MG 12 hr tablet Take 600 mg by mouth daily as needed for cough.    Yes [provider]  lansoprazole (PREVACID) 30 MG capsule Take 30 mg by mouth daily.    Yes [provider]  linaclotide Rolan Lipa) 145 MCG CAPS capsule Take 1 capsule (145 mcg total) by mouth daily before breakfast. 01/22/19  Yes Annitta Needs, NP  linaclotide [facility]) 290 MCG CAPS capsule Take 1 capsule (290 mcg total) by mouth daily before breakfast. 01/23/18  Yes Annitta Needs, NP  Multiple Vitamin (MULTIVITAMIN) tablet Take 1 tablet by mouth daily.   Yes [provider]  Omega-3 Fatty Acids (SALMON OIL-1000 PO) Take 2,000 mg by mouth daily.   Yes [provider]  OVER THE COUNTER MEDICATION Take 2 tablets by mouth daily. Hair Growth Supplement   Yes [provider]   phentermine (ADIPEX-P) 37.5 MG tablet Take 37.5 mg by mouth daily.  03/09/18  Yes [provider]  pramipexole (MIRAPEX) 0.125 MG tablet TAKE 3 TABLETS BY MOUTH IN THE EVENING TAKE  90-120  MINUTES  BEFORE  BEDTIME 01/11/19  Yes Kathrynn Ducking, MD  prednisoLONE acetate (PRED FORTE) 1 % ophthalmic suspension Place 1 drop into the right eye 2 (two) times a day. 04/23/19  Yes [provider]  sulfamethoxazole-trimethoprim (BACTRIM DS,SEPTRA DS) 800-160 MG tablet Take 1 tablet by mouth 2 (two) times daily.   Yes [provider]  vitamin C (ASCORBIC ACID) 500 MG tablet Take 500 mg by mouth daily.  Yes [provider]  amLODipine-benazepril (LOTREL) 10-40 MG per capsule Take 1 capsule by mouth daily.    [provider]     Critical care time: 31 minutes

## 2019-04-25 NOTE — ED Notes (Signed)
Pt asleep with equal rise and chest fall.

## 2019-04-26 DIAGNOSIS — T1491XA Suicide attempt, initial encounter: Secondary | ICD-10-CM

## 2019-04-26 DIAGNOSIS — R578 Other shock: Secondary | ICD-10-CM

## 2019-04-26 LAB — BASIC METABOLIC PANEL
Anion gap: 7 (ref 5–15)
BUN: <5 mg/dL — ABNORMAL LOW (ref 6–20)
CO2: 23 mmol/L (ref 22–32)
Calcium: 8.4 mg/dL — ABNORMAL LOW (ref 8.9–10.3)
Chloride: 109 mmol/L (ref 98–111)
Creatinine, Ser: 0.63 mg/dL (ref 0.44–1.00)
GFR calc Af Amer: >60 mL/min (ref >60)
GFR calc non Af Amer: >60 mL/min (ref >60)
Glucose, Bld: 90 mg/dL (ref 70–99)
Potassium: 3.1 mmol/L — ABNORMAL LOW (ref 3.5–5.1)
Sodium: 139 mmol/L (ref 135–145)

## 2019-04-26 LAB — GLUCOSE, CAPILLARY
Glucose-Capillary: 73 mg/dL (ref 70–99)
Glucose-Capillary: 87 mg/dL (ref 70–99)
Glucose-Capillary: 88 mg/dL (ref 70–99)
Glucose-Capillary: 91 mg/dL (ref 70–99)
Glucose-Capillary: 93 mg/dL (ref 70–99)
Glucose-Capillary: 97 mg/dL (ref 70–99)

## 2019-04-26 LAB — PHOSPHORUS: Phosphorus: 3.1 mg/dL (ref 2.5–4.6)

## 2019-04-26 LAB — MAGNESIUM: Magnesium: 2 mg/dL (ref 1.7–2.4)

## 2019-04-26 LAB — HIV ANTIBODY (ROUTINE TESTING W REFLEX): HIV Screen 4th Generation wRfx: NONREACTIVE

## 2019-04-26 MED ORDER — STERILE WATER FOR INJECTION IJ SOLN
INTRAMUSCULAR | Status: AC
  Filled 2019-04-26: qty 10

## 2019-04-26 MED ORDER — POTASSIUM CHLORIDE 10 MEQ/100ML IV SOLN
10.0000 meq | INTRAVENOUS | Status: AC
  Administered 2019-04-26 (×3): 10 meq via INTRAVENOUS
  Filled 2019-04-26 (×3): qty 100

## 2019-04-26 MED ORDER — ZIPRASIDONE MESYLATE 20 MG IM SOLR: 20.0000 mg | Freq: Once | INTRAMUSCULAR | Status: DC

## 2019-04-26 MED ORDER — ZIPRASIDONE MESYLATE 20 MG IM SOLR
20.0000 mg | Freq: Four times a day (QID) | INTRAMUSCULAR | Status: DC | PRN
  Filled 2019-04-26: qty 20

## 2019-04-26 MED ORDER — ZIPRASIDONE MESYLATE 20 MG IM SOLR
20.0000 mg | Freq: Once | INTRAMUSCULAR | Status: AC
  Administered 2019-04-26: 16:00:00 20 mg via INTRAMUSCULAR
  Filled 2019-04-26: qty 20

## 2019-04-26 MED ORDER — ZIPRASIDONE MESYLATE 20 MG IM SOLR: 20.0000 mg | INTRAMUSCULAR | Status: DC | PRN

## 2019-04-26 NOTE — Progress Notes (Signed)
Called and left VM for father.

## 2019-04-26 NOTE — Progress Notes (Signed)
Pt arrived to room 5N29 from 3MW. Received report from [place], Therapist, sports. At this time, pt is calm with sitter at bedside. 4 point restraints currently released. Active order for 1:1 sitter and non-violent restraints. Pt reoriented. Will continue to monitor.

## 2019-04-26 NOTE — Progress Notes (Signed)
[facility] ADULT ICU REPLACEMENT PROTOCOL FOR AM LAB REPLACEMENT ONLY  The patient does apply for the [facility] Adult ICU Electrolyte Replacment Protocol based on the criteria listed below:   1. Is GFR >/= 40 ml/min? Yes.    Patient's GFR today is >60 2. Is urine output >/= 0.5 ml/kg/hr for the last 6 hours? Yes.   Patient's UOP is 0.84 ml/kg/hr 3. Is BUN < 60 mg/dL? Yes.    Patient's BUN today is <5 4. Abnormal electrolyte K 3.0 5. Ordered repletion with: per protocol 6. If a panic level lab has been reported, has the CCM MD in charge been notified? Yes.  .   Physician:  Alphonzo Severance 04/26/2019 5:24 AM

## 2019-04-26 NOTE — Progress Notes (Signed)
Results for ZAMIYAH, RESENDES (MRN [id number]) as of 04/26/2019 10:53  Ref. Range 04/25/2019 16:48 04/25/2019 20:16 04/25/2019 23:50 04/26/2019 04:25 04/26/2019 07:45  Glucose-Capillary Latest Ref Range: 70 - 99 mg/dL 183 (H) 64 (L) 87 91 97  Noted that blood sugars have been less than 100 mg/dl.  Recommend discontinuing Novolog 0-24 units every 4 hours. No history of diabetes noted. No home diabetes medications noted.   Harvel Ricks RN BSN CDE Diabetes Coordinator Pager: [phone]  8am-5pm

## 2019-04-26 NOTE — Progress Notes (Signed)
ICU Progress Note  S: Seen in f/u for suicide attempt.  Had asymptomatic hypoglycemia overnight, started on D5 drip.  She says she hates being here and wants everyone to leave her alone.  States that we should have let her kill herself.  O: Vitals benign.  Weaned off levophed yesterday. Appears well, eyes closed. Refused further exam.  A: Suicide attempts: no obvious serious sequelae based on EKG/vitals/labs. Hypoglycemia related to not eating for over a day.  P: Psych consult. Start diet. Suicide precautions. Stop fluids and lab checks. Continue to check CBG Fine for floor vs. Inpatient psych transfer  Erskine Emery MD

## 2019-04-26 NOTE — Plan of Care (Signed)
Pt refuses meds and treatments, combative and very agitated.

## 2019-04-26 NOTE — Consult Note (Signed)
Telepsych Consultation   Reason for Consult:  Suicide attempt  Referring Physician:  Dr. Ina Homes Location of Patient:  [facility] Location of Provider: [facility]  Patient Identification: Sara Barnett MRN:  [id number] Principal Diagnosis: Suicide attempt [facility]) Diagnosis:  Active Problems:   Drug overdose, intentional self-harm, initial encounter ([place])   Shock ([place])   Total Time spent with patient: 1 hour  Subjective:   Sara Barnett is a 53 y.o. female patient admitted with suicide attempt by overdose.  HPI:   Per chart review, patient was admitted with intentional overdose of Benadryl, Amlodipine, Klonopin and alcohol. BAL was 54 and UDS was positive for amphetamines on admission. She has been agitated and refusing care. She reportedly stated, "we should have let her kill herself." She was weaned off Levophed yesterday. She received IM Geodon 20 mg yesterday for agitation. Home medications include Wellbutrin 100 mg BID, Phentermine 37.5 mg daily and Klonopin 0.5 mg BID.   On interview, Sara Barnett is very irritable and minimally participating in interview. She reports, "Why wouldn't I?" when asked about wanting to harm herself. She endorses SI. She reports, "I want to die but no one will let me. No one can help me." She denies a history of prior suicide attempts. She denies HI or AVH. She will not identify her stressors and reports, "what's not going on." She reports infrequent alcohol use. She denies illicit substance use. She denies ingestion of other substances yesterday.   Past Psychiatric History: Depression and anxiety.   Risk to Self:  Yes. Endorses SI and recent suicide attempt.  Risk to Others:  None. Denies HI.  Prior Inpatient Therapy:  Denies  Prior Outpatient Therapy:  She is followed by Juel Burrow, FNP.   Past Medical History:  Past Medical History:  Diagnosis Date  . Abnormally small mouth   . Anxiety   . Arthritis    knees  .  Chronic ethmoidal sinusitis 07/2017  . Chronic maxillary sinusitis 07/2017  . Complication of anesthesia    anaphylaxis after Methylprednisolone injection - cardiac arrest/PEA  . 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 Constipation   . Depression   . Deviated nasal septum 07/2017  . GERD (gastroesophageal reflux disease)   . History of cardiac murmur    states no known problems, no cardiologist  . Hypertension    states under control with meds., has been on med. x 17 years  . Legally blind   . Nasal turbinate hypertrophy 07/2017  . Rash of back 08/18/2017  . Restless leg     Past Surgical History:  Procedure Laterality Date  . ABLATION ON ENDOMETRIOSIS    . BUNIONECTOMY Right   . CATARACT EXTRACTION, BILATERAL     age 57 year  . CESAREAN SECTION     x 2  . COLONOSCOPY WITH PROPOFOL N/A 08/25/2018   Procedure: COLONOSCOPY WITH PROPOFOL;  Surgeon: Danie Binder, MD;  [facility];  Service: Endoscopy;  Laterality: N/A;  2:15pm  . ENDOSCOPIC CONCHA BULLOSA RESECTION Bilateral 08/25/2017   Procedure: BILATERAL ENDOSCOPIC CONCHA BULLOSA RESECTION;  Surgeon: Leta Baptist, MD;  Location: [place];  Service: ENT;  Laterality: Bilateral;  . ETHMOIDECTOMY Bilateral 08/25/2017   Procedure: BILATERAL TOTAL ETHMOIDECTOMY;  Surgeon: Leta Baptist, MD;  Location: [place];  Service: ENT;  Laterality: Bilateral;  . EYE SURGERY    . INTRAOCULAR LENS INSERTION Bilateral    age 26s  . LUMBAR FUSION  07/05/2004  . LUMBAR LAMINECTOMY  07/05/2004   L5-S1  . MAXILLARY ANTROSTOMY Bilateral 08/25/2017   Procedure: BILATERAL MAXILLARY ANTROSTOMY WITH TISSUE REMOVAL;  Surgeon: Leta Baptist, MD;  Location: [place];  Service: ENT;  Laterality: Bilateral;  . MEMBRANE PEEL Right 11/06/2011; 08/06/2013  . NASAL SEPTOPLASTY W/ TURBINOPLASTY Bilateral 08/25/2017   Procedure: NASAL SEPTOPLASTY WITH BILATERAL TURBINATE REDUCTION;  Surgeon: Leta Baptist, MD;   Location: [place];  Service: ENT;  Laterality: Bilateral;  . PARS PLANA VITRECTOMY Right 03/22/2011; 08/21/2011; 12/20/2011; 05/13/2012; 11/20/2012; 05/12/2013; 08/06/2013; 05/17/2016  . PARS PLANA VITRECTOMY W/ SCLERAL BUCKLE Left 04/21/2015  . PLANTAR FASCIA RELEASE Right   . SINUS ENDO W/FUSION Bilateral 08/25/2017   Procedure: ENDOSCOPIC SINUS SURGERY WITH FUSION NAVIGATION;  Surgeon: Leta Baptist, MD;  Location: [place];  Service: ENT;  Laterality: Bilateral;  . TUBAL LIGATION     Family History:  Family History  Problem Relation Age of Onset  . Depression Mother   . Cancer Father        prostate  . Hypertension Father   . Hearing loss Father   . Other Father        dialysis  . Vision loss Daughter        cataracts  . Stroke Maternal Grandmother   . Other Paternal Grandmother        lung issues  . Vision loss Daughter        cataracts  . Colon cancer Neg Hx   . Colon polyps Neg Hx    Family Psychiatric  History: Mother-depression Social History:  Social History   Substance and Sexual Activity  Alcohol Use Yes   Comment: occasionally     Social History   Substance and Sexual Activity  Drug Use No    Social History   Socioeconomic History  . Marital status: Divorced    Spouse name: Not on file  . Number of children: Not on file  . Years of education: Not on file  . Highest education level: Not on file  Occupational History  . Not on file  Social Needs  . Financial resource strain: Not on file  . Food insecurity    Worry: Not on file    Inability: Not on file  . Transportation needs    Medical: Not on file    Non-medical: Not on file  Tobacco Use  . Smoking status: Former Smoker    Packs/day: 3.00    Years: 15.00    Pack years: 45.00    Types: Cigarettes    Quit date: 05/24/2009    Years since quitting: 9.9  . Smokeless tobacco: Never Used  Substance and Sexual Activity  . Alcohol use: Yes    Comment: occasionally  .  Drug use: No  . Sexual activity: Not Currently    Birth control/protection: None, Surgical    Comment: tubal and ablation  Lifestyle  . Physical activity    Days per week: Not on file    Minutes per session: Not on file  . Stress: Not on file  Relationships  . Social Herbalist on phone: Not on file    Gets together: Not on file    Attends religious service: Not on file    Active member of club or organization: Not on file    Attends meetings of clubs or organizations: Not on file    Relationship status: Not on file  Other Topics Concern  .  Not on file  Social History Narrative  . Not on file   Additional Social History: N/A    Allergies:   Allergies  Allergen Reactions  . Methylprednisolone Anaphylaxis and Other (See Comments)    CARDIAC ARREST/PEA   . Augmentin [Amoxicillin-Pot Clavulanate] Other (See Comments)    "FEELING HOT"; EXTREME SLEEPINESS  . Doxycycline Itching and Other (See Comments)    THROAT SWELLING  . Hydrocodone-Acetaminophen Itching and Other (See Comments)    THROAT SWELLING  . Statins Other (See Comments)    JOINT PAIN, swellling  . Other Other (See Comments)    Steroids - Unknown drug and strength, stopped patient's heart    Labs:  Results for orders placed or performed during the hospital encounter of 04/24/19 (from the past 48 hour(s))  CBC with Differential/Platelet     Status: None   Collection Time: 04/24/19 10:34 PM  Result Value Ref Range   WBC 7.5 4.0 - 10.5 K/uL   RBC 4.63 3.87 - 5.11 MIL/uL   Hemoglobin 14.2 12.0 - 15.0 g/dL   HCT 44.3 36.0 - 46.0 %   MCV 95.7 80.0 - 100.0 fL   MCH 30.7 26.0 - 34.0 pg   MCHC 32.1 30.0 - 36.0 g/dL   RDW 12.5 11.5 - 15.5 %   Platelets 274 150 - 400 K/uL   nRBC 0.0 0.0 - 0.2 %   Neutrophils Relative % 76 %   Neutro Abs 5.7 1.7 - 7.7 K/uL   Lymphocytes Relative 16 %   Lymphs Abs 1.2 0.7 - 4.0 K/uL   Monocytes Relative 5 %   Monocytes Absolute 0.3 0.1 - 1.0 K/uL   Eosinophils Relative  2 %   Eosinophils Absolute 0.2 0.0 - 0.5 K/uL   Basophils Relative 1 %   Basophils Absolute 0.1 0.0 - 0.1 K/uL   Immature Granulocytes 0 %   Abs Immature Granulocytes 0.02 0.00 - 0.07 K/uL    Comment: Performed at [facility], [address] [place], [place] [address]  Comprehensive metabolic panel     Status: Abnormal   Collection Time: 04/24/19 10:34 PM  Result Value Ref Range   Sodium 141 135 - 145 mmol/L   Potassium 3.3 (L) 3.5 - 5.1 mmol/L   Chloride 112 (H) 98 - 111 mmol/L   CO2 17 (L) 22 - 32 mmol/L   Glucose, Bld 83 70 - 99 mg/dL   BUN 9 6 - 20 mg/dL   Creatinine, Ser 0.88 0.44 - 1.00 mg/dL   Calcium 8.3 (L) 8.9 - 10.3 mg/dL   Total Protein 6.3 (L) 6.5 - 8.1 g/dL   Albumin 3.7 3.5 - 5.0 g/dL   AST 15 15 - 41 U/L   ALT 15 0 - 44 U/L   Alkaline Phosphatase 62 38 - 126 U/L   Total Bilirubin 0.5 0.3 - 1.2 mg/dL   GFR calc non Af Amer >60 >60 mL/min   GFR calc Af Amer >60 >60 mL/min   Anion gap 12 5 - 15    Comment: Performed at [facility], [address] [place], [place] [address]  Ethanol     Status: Abnormal   Collection Time: 04/24/19 10:34 PM  Result Value Ref Range   Alcohol, Ethyl (B) 54 (H) <10 mg/dL    Comment: (NOTE) Lowest detectable limit for serum alcohol is 10 mg/dL. For medical purposes only. Performed at [facility], [address], [place], [place] [address]   Troponin I (High Sensitivity)     Status:  None   Collection Time: 04/24/19 10:34 PM  Result Value Ref Range   Troponin I (High Sensitivity) <2.0 <18 ng/L    Comment: Performed at [facility], [address], [place], [place] [address]  Rapid urine drug screen (hospital performed)     Status: Abnormal   Collection Time: 04/24/19 10:50 PM  Result Value Ref Range   Opiates NONE DETECTED NONE DETECTED   Cocaine NONE DETECTED NONE DETECTED   Benzodiazepines NONE DETECTED NONE DETECTED   Amphetamines POSITIVE (A) NONE DETECTED   Tetrahydrocannabinol NONE DETECTED NONE DETECTED   Barbiturates  NONE DETECTED NONE DETECTED    Comment: (NOTE) DRUG SCREEN FOR MEDICAL PURPOSES ONLY.  IF CONFIRMATION IS NEEDED FOR ANY PURPOSE, NOTIFY LAB WITHIN 5 DAYS. LOWEST DETECTABLE LIMITS FOR URINE DRUG SCREEN Drug Class                     Cutoff (ng/mL) Amphetamine and metabolites    1000 Barbiturate and metabolites    200 Benzodiazepine                 932 Tricyclics and metabolites     300 Opiates and metabolites        300 Cocaine and metabolites        300 THC                            50 Performed at [facility], [address], [place], [place] [address]   Troponin I (High Sensitivity)     Status: None   Collection Time: 04/24/19 11:28 PM  Result Value Ref Range   Troponin I (High Sensitivity) <2.0 <18 ng/L    Comment: Performed at [facility], [address] [place], [place] [address]  Lactic acid, plasma     Status: Abnormal   Collection Time: 04/24/19 11:28 PM  Result Value Ref Range   Lactic Acid, Venous 3.8 (HH) 0.5 - 1.9 mmol/L    Comment: CRITICAL RESULT CALLED TO, READ BACK BY AND VERIFIED WITH: POINDEXTER,M @ 0023 ON 04/25/19 BY JUW Performed at [facility], [address], [place], [place] [address]   Acetaminophen level     Status: Abnormal   Collection Time: 04/24/19 11:28 PM  Result Value Ref Range   Acetaminophen (Tylenol), Serum <10 (L) 10 - 30 ug/mL    Comment: (NOTE) Therapeutic concentrations vary significantly. A range of 10-30 ug/mL  may be an effective concentration for many patients. However, some  are best treated at concentrations outside of this range. Acetaminophen concentrations >150 ug/mL at 4 hours after ingestion  and >50 ug/mL at 12 hours after ingestion are often associated with  toxic reactions. Performed at [facility], [address] [place], [place] [address]   Salicylate level     Status: None   Collection Time: 04/24/19 11:28 PM  Result Value Ref Range   Salicylate Lvl <8.3 2.8 - 30.0 mg/dL    Comment: Performed at [facility], [address], [place], [place] [address]  Urinalysis, Routine w reflex microscopic     Status: Abnormal   Collection Time: 04/25/19 12:45 AM  Result Value Ref Range   Color, Urine STRAW (A) YELLOW   APPearance CLEAR CLEAR   Specific Gravity, Urine 1.004 (L) 1.005 - 1.030   pH 5.0 5.0 - 8.0   Glucose, UA NEGATIVE NEGATIVE mg/dL   Hgb urine dipstick NEGATIVE NEGATIVE   Bilirubin Urine NEGATIVE NEGATIVE  Ketones, ur NEGATIVE NEGATIVE mg/dL   Protein, ur NEGATIVE NEGATIVE mg/dL   Nitrite NEGATIVE NEGATIVE   Leukocytes,Ua NEGATIVE NEGATIVE    Comment: Performed at [facility], [address] [place], [place] [address]  SARS Coronavirus 2 (CEPHEID - Performed in [place] hospital lab), Hosp Order     Status: None   Collection Time: 04/25/19 12:45 AM   Specimen: Nasopharyngeal Swab  Result Value Ref Range   SARS Coronavirus 2 NEGATIVE NEGATIVE    Comment: (NOTE) If result is NEGATIVE SARS-CoV-2 target nucleic acids are NOT DETECTED. The SARS-CoV-2 RNA is generally detectable in upper and lower  respiratory specimens during the acute phase of infection. The lowest  concentration of SARS-CoV-2 viral copies this assay can detect is 250  copies / mL. A negative result does not preclude SARS-CoV-2 infection  and should not be used as the sole basis for treatment or other  patient management decisions.  A negative result may occur with  improper specimen collection / handling, submission of specimen other  than nasopharyngeal swab, presence of viral mutation(s) within the  areas targeted by this assay, and inadequate number of viral copies  (<250 copies / mL). A negative result must be combined with clinical  observations, patient history, and epidemiological information. If result is POSITIVE SARS-CoV-2 target nucleic acids are DETECTED. The SARS-CoV-2 RNA is generally detectable in upper and lower  respiratory specimens dur ing the acute phase of infection.  Positive  results  are indicative of active infection with SARS-CoV-2.  Clinical  correlation with patient history and other diagnostic information is  necessary to determine patient infection status.  Positive results do  not rule out bacterial infection or co-infection with other viruses. If result is PRESUMPTIVE POSTIVE SARS-CoV-2 nucleic acids MAY BE PRESENT.   A presumptive positive result was obtained on the submitted specimen  and confirmed on repeat testing.  While 2019 novel coronavirus  (SARS-CoV-2) nucleic acids may be present in the submitted sample  additional confirmatory testing may be necessary for epidemiological  and / or clinical management purposes  to differentiate between  SARS-CoV-2 and other Sarbecovirus currently known to infect humans.  If clinically indicated additional testing with an alternate test  methodology [phone]) is advised. The SARS-CoV-2 RNA is generally  detectable in upper and lower respiratory sp ecimens during the acute  phase of infection. The expected result is Negative. Fact Sheet for Patients:  StrictlyIdeas.no Fact Sheet for Healthcare Providers: BankingDealers.co.za This test is not yet approved or cleared by the [place] FDA and has been authorized for detection and/or diagnosis of SARS-CoV-2 by FDA under an Emergency Use Authorization (EUA).  This EUA will remain in effect (meaning this test can be used) for the duration of the COVID-19 declaration under Section 564(b)(1) of the Act, 21 U.S.C. section 360bbb-3(b)(1), unless the authorization is terminated or revoked sooner. Performed at [facility], [address] [place], [place] [address]   Blood gas, arterial (at [facility])     Status: Abnormal   Collection Time: 04/25/19  2:07 AM  Result Value Ref Range   FIO2 28.00    pH, Arterial 7.316 (L) 7.350 - 7.450   pCO2 arterial 33.3 32.0 - 48.0 mmHg   pO2, Arterial 74.6 (L) 83.0 - 108.0 mmHg    Bicarbonate 17.7 (L) 20.0 - 28.0 mmol/L   Acid-base deficit 8.4 (H) 0.0 - 2.0 mmol/L   O2 Saturation 93.2 %   Patient temperature 37.0    Allens test (  pass/fail) NOT INDICATED (A) PASS    Comment: Performed at [facility], [address], [place], [place] [address]  Lactic acid, plasma     Status: Abnormal   Collection Time: 04/25/19  2:09 AM  Result Value Ref Range   Lactic Acid, Venous 2.6 (HH) 0.5 - 1.9 mmol/L    Comment: CRITICAL RESULT CALLED TO, READ BACK BY AND VERIFIED WITH: LONG,H @ 0241 ON 12/25/18 BY JUW Performed at [facility], [address] [place], [place] [address]   Lactic acid, plasma     Status: Abnormal   Collection Time: 04/25/19  3:54 AM  Result Value Ref Range   Lactic Acid, Venous 2.3 (HH) 0.5 - 1.9 mmol/L    Comment: CRITICAL RESULT CALLED TO, READ BACK BY AND VERIFIED WITH: LONG,H @ 0431 ON 04/25/19 BY JUW Performed at [facility], [address], [place], [place] [address]   MRSA PCR Screening     Status: None   Collection Time: 04/25/19 11:07 AM   Specimen: Nasal Mucosa; Nasopharyngeal  Result Value Ref Range   MRSA by PCR NEGATIVE NEGATIVE    Comment:        The GeneXpert MRSA Assay (FDA approved for NASAL specimens only), is one component of a comprehensive MRSA colonization surveillance program. It is not intended to diagnose MRSA infection nor to guide or monitor treatment for MRSA infections. Performed at [facility], [place] [address], [place], [place] [address]   Glucose, capillary     Status: None   Collection Time: 04/25/19 12:11 PM  Result Value Ref Range   Glucose-Capillary 88 70 - 99 mg/dL  CBC     Status: Abnormal   Collection Time: 04/25/19 12:49 PM  Result Value Ref Range   WBC 8.2 4.0 - 10.5 K/uL   RBC 5.15 (H) 3.87 - 5.11 MIL/uL   Hemoglobin 15.8 (H) 12.0 - 15.0 g/dL   HCT 46.5 (H) 36.0 - 46.0 %   MCV 90.3 80.0 - 100.0 fL   MCH 30.7 26.0 - 34.0 pg   MCHC 34.0 30.0 - 36.0 g/dL   RDW 11.8 11.5 - 15.5 %   Platelets  255 150 - 400 K/uL   nRBC 0.0 0.0 - 0.2 %    Comment: Performed at [facility], [place] [address] [place], [place] [address]  Creatinine, serum     Status: None   Collection Time: 04/25/19 12:49 PM  Result Value Ref Range   Creatinine, Ser 0.71 0.44 - 1.00 mg/dL   GFR calc non Af Amer >60 >60 mL/min   GFR calc Af Amer >60 >60 mL/min    Comment: Performed at [place] [address], [place], [place] [address]  Glucose, capillary     Status: Abnormal   Collection Time: 04/25/19  4:16 PM  Result Value Ref Range   Glucose-Capillary 62 (L) 70 - 99 mg/dL  Glucose, capillary     Status: Abnormal   Collection Time: 04/25/19  4:48 PM  Result Value Ref Range   Glucose-Capillary 183 (H) 70 - 99 mg/dL  Glucose, capillary     Status: Abnormal   Collection Time: 04/25/19  8:16 PM  Result Value Ref Range   Glucose-Capillary 64 (L) 70 - 99 mg/dL  Glucose, capillary     Status: None   Collection Time: 04/25/19 11:50 PM  Result Value Ref Range   Glucose-Capillary 87 70 - 99 mg/dL  Basic metabolic panel     Status: Abnormal  Collection Time: 04/26/19  2:20 AM  Result Value Ref Range   Sodium 139 135 - 145 mmol/L   Potassium 3.1 (L) 3.5 - 5.1 mmol/L   Chloride 109 98 - 111 mmol/L   CO2 23 22 - 32 mmol/L   Glucose, Bld 90 70 - 99 mg/dL   BUN <5 (L) 6 - 20 mg/dL   Creatinine, Ser 0.63 0.44 - 1.00 mg/dL   Calcium 8.4 (L) 8.9 - 10.3 mg/dL   GFR calc non Af Amer >60 >60 mL/min   GFR calc Af Amer >60 >60 mL/min   Anion gap 7 5 - 15    Comment: Performed at [place] [address] [place], [place] [address]  Magnesium     Status: None   Collection Time: 04/26/19  2:20 AM  Result Value Ref Range   Magnesium 2.0 1.7 - 2.4 mg/dL    Comment: Performed at [facility], [place] [address] [place], [place] [address]  Phosphorus     Status: None   Collection Time: 04/26/19  2:20 AM  Result Value Ref Range   Phosphorus 3.1 2.5 - 4.6 mg/dL    Comment: Performed at [place] [address], [place], [place] [address]  HIV Antibody (routine testing w rflx)     Status: None   Collection Time: 04/26/19  2:20 AM  Result Value Ref Range   HIV Screen 4th Generation wRfx Non Reactive Non Reactive    Comment: (NOTE) Performed At: [facility] [place], [place] [id number] Rush Farmer MD OZ:[id number]   Glucose, capillary     Status: None   Collection Time: 04/26/19  4:25 AM  Result Value Ref Range   Glucose-Capillary 91 70 - 99 mg/dL  Glucose, capillary     Status: None   Collection Time: 04/26/19  7:45 AM  Result Value Ref Range   Glucose-Capillary 97 70 - 99 mg/dL  Glucose, capillary     Status: None   Collection Time: 04/26/19 11:32 AM  Result Value Ref Range   Glucose-Capillary 93 70 - 99 mg/dL    Medications:  Current Facility-Administered Medications  Medication Dose Route Frequency Provider Last Rate Last Dose  . 0.9 %  sodium chloride infusion  250 mL Intravenous Continuous Rancour, Stephen, MD      . acetaminophen (TYLENOL) tablet 650 mg  650 mg Oral Q4H PRN Candee Furbish, MD      . insulin aspart (novoLOG) injection 0-24 Units  0-24 Units Subcutaneous Q4H Candee Furbish, MD      . ondansetron [facility]) injection 4 mg  4 mg Intravenous Q6H PRN Candee Furbish, MD      . pantoprazole (PROTONIX) EC tablet 40 mg  40 mg Oral Daily Candee Furbish, MD      . prednisoLONE acetate (PRED FORTE) 1 % ophthalmic suspension 1 drop  1 drop Right Eye BID Candee Furbish, MD      . vitamin C (ASCORBIC ACID) tablet 500 mg  500 mg Oral Daily Candee Furbish, MD      . ziprasidone (GEODON) injection 20 mg  20 mg Intramuscular Once Candee Furbish, MD        Musculoskeletal: Strength & Muscle Tone: No atrophy noted. Gait & Station: UTA since patient is lying in bed. Patient leans: N/A  Psychiatric Specialty Exam: Physical Exam  Nursing note and vitals reviewed. Constitutional: She is oriented to person, place, and time.  She appears well-developed and well-nourished.  HENT:  Head: Normocephalic and atraumatic.  Neck: Normal range of motion.  Respiratory: Effort normal.  Musculoskeletal: Normal range of motion.  Neurological: She is alert and oriented to person, place, and time.  Psychiatric: Her speech is normal. Her affect is angry and labile. She is agitated. Cognition and memory are normal. She expresses impulsivity. She expresses suicidal ideation. She expresses suicidal plans.    Review of Systems  Psychiatric/Behavioral: Positive for depression and suicidal ideas. Negative for hallucinations and substance abuse.  All other systems reviewed and are negative.   Blood pressure 123/76, pulse 79, temperature (!) 97.4 F (36.3 C), temperature source Axillary, resp. rate (!) 24, height 5\' 1"  (1.549 m), weight 74 kg, SpO2 (!) 81 %.Body mass index is 30.83 kg/m.  General Appearance: Disheveled, middle aged, Caucasian female, wearing a hospital gown who is lying in bed. NAD.   Eye Contact:  Fair  Speech:  Clear and Coherent and Normal Rate  Volume:  Normal  Mood:  Depressed  Affect:  Labile  Thought Process:  Goal Directed, Linear and Descriptions of Associations: Intact  Orientation:  Full (Time, Place, and Person)  Thought Content:  Logical  Suicidal Thoughts:  Yes.  with intent/plan  Homicidal Thoughts:  No  Memory:  Immediate;   Good Recent;   Good Remote;   Good  Judgement:  Poor  Insight:  Fair  Psychomotor Activity:  Normal  Concentration:  Concentration: Good and Attention Span: Good  Recall:  Good  Fund of Knowledge:  Good  Language:  Good  Akathisia:  No  Handed:  Right  AIMS (if indicated):   N/A  Assets:  Housing Resilience  ADL's:  Intact  Cognition:  WNL  Sleep:   N/A   Assessment:  Sara Barnett is a 53 y.o. female who was admitted with intentional overdose of Benadryl, Amlodipine, Klonopin and alcohol in a suicide attempt. She is irritable on interview. She is disheveled  in appearance. She endorses current SI with intention to harm herself. She denies HI or AVH. She warrants inpatient psychiatric hospitalization for stabilization and treatment.   Treatment Plan Summary: -Patient warrants inpatient psychiatric hospitalization given high risk of harm to self. -Continue bedside sitter.  -Continue to hold home psychotropic medications due to overdose.  -EKG reviewed and QTc 434 on 6/27. Please closely monitor when starting or increasing QTc prolonging agents.   -Please pursue involuntary commitment if patient refuses voluntary psychiatric hospitalization or attempts to leave the hospital.  -Will sign off on patient at this time. Please consult psychiatry again as needed.     Disposition: Recommend psychiatric Inpatient admission when medically cleared.  This service was provided via telemedicine using a 2-way, interactive audio and video technology.  Names of all persons participating in this telemedicine service and their role in this encounter. Name: Buford Dresser, DO Role: Psychiatrist  Name: Sara Barnett Role: Patient     Faythe Dingwall, DO 04/26/2019 2:25 PM

## 2019-04-27 DIAGNOSIS — T1491XA Suicide attempt, initial encounter: Secondary | ICD-10-CM

## 2019-04-27 LAB — GLUCOSE, CAPILLARY
Glucose-Capillary: 86 mg/dL (ref 70–99)
Glucose-Capillary: 97 mg/dL (ref 70–99)
Glucose-Capillary: 95 mg/dL (ref 70–99)
Glucose-Capillary: 119 mg/dL — ABNORMAL HIGH (ref 70–99)
Glucose-Capillary: 103 mg/dL — ABNORMAL HIGH (ref 70–99)
Glucose-Capillary: 96 mg/dL (ref 70–99)

## 2019-04-27 NOTE — Progress Notes (Signed)
Pt's father called requesting update from MD. Vaughan Browner, MD paged. Will continue to monitor.

## 2019-04-27 NOTE — Discharge Summary (Signed)
Physician Discharge Summary  Patient ID: LORAY AKARD MRN: [id number] DOB/AGE: [birth year] 53 y.o.  Admit date: 04/24/2019 Discharge date: 04/27/2019    Discharge Diagnoses:  Intentional Overdose, Suicide Attempt  Depression  HTN GERD                                                                  DISCHARGE PLAN BY DIAGNOSIS    Intentional Overdose, Suicide Attempt  Discharge Plan: -Inpatient Behavioral Health Admission   Depression  Discharge Plan: -Per Psychiatry > continue to hold home psychotropic medications at this time   HTN Discharge Plan: -Continue to hold hypertensive medications due to borderline BP  GERD                                              Discharge Plan: -Continue Prevacid   Chronic Constipation Plan -Continue Linzess                  DISCHARGE SUMMARY   53 year old female presents to ED on 6/28 after intentional overdose with benadryl, amlodipine, clonazepam, ETOH. With progressive hypotension given calcium and started on levophed gtt. On 6/29 weaned off pressors and transferred to [facility] unit. Psychiatry consulted. Recommends inpatient psychiatric hospitalization. Discharged to [facility].         SIGNIFICANT DIAGNOSTIC STUDIES CXR 6/27 > Diffuse interstitial coarsening. No focal consolidation, pleural effusion, or pneumothorax. The cardiac silhouette is within normal limits. No acute osseous pathology. CT Head 6/28 > No acute intracranial abnormality.  MICRO DATA  COVID 6/27 > Negative   Discharge Exam: General: Adult female, no distress, sitting in bed  Neuro: alert, oriented, follows commands  CV: RRR, no MRG PULM: Clear breath sounds, no wheeze/crackles  GI: intact  Extremities: -edema   Vitals:   04/26/19 1828 04/27/19 0351 04/27/19 0800 04/27/19 0959  BP: 107/75 107/63  106/70  Pulse: 74 70  74  Resp: 18 19  20   Temp: 97.8 F (36.6 C) 97.9 F (36.6 C)  99.3 F (37.4 C)  TempSrc: Oral Oral  Oral  SpO2: 95%  91%  93%  Weight:   76.5 kg   Height:         Discharge Labs  BMET Recent Labs  Lab 04/24/19 2234 04/25/19 1249 04/26/19 0220  NA 141  --  139  K 3.3*  --  3.1*  CL 112*  --  109  CO2 17*  --  23  GLUCOSE 83  --  90  BUN 9  --  <5*  CREATININE 0.88 0.71 0.63  CALCIUM 8.3*  --  8.4*  MG  --   --  2.0  PHOS  --   --  3.1    CBC Recent Labs  Lab 04/24/19 2234 04/25/19 1249  HGB 14.2 15.8*  HCT 44.3 46.5*  WBC 7.5 8.2  PLT 274 255    Anti-Coagulation No results for input(s): INR in the last 168 hours.        Allergies as of 04/27/2019      Reactions   Methylprednisolone Anaphylaxis, Other (See Comments)   CARDIAC ARREST/PEA    Augmentin [  amoxicillin-pot Clavulanate] Other (See Comments)   "FEELING HOT"; EXTREME SLEEPINESS   Doxycycline Itching, Other (See Comments)   THROAT SWELLING   Hydrocodone-acetaminophen Itching, Other (See Comments)   THROAT SWELLING   Statins Other (See Comments)   JOINT PAIN, swellling   Other Other (See Comments)   Steroids - Unknown drug and strength, stopped patient's heart      Medication List    STOP taking these medications   amLODipine 10 MG tablet Commonly known as: NORVASC   amLODipine-benazepril 10-40 MG capsule Commonly known as: LOTREL   benazepril 40 MG tablet Commonly known as: LOTENSIN   buPROPion 100 MG 12 hr tablet Commonly known as: WELLBUTRIN SR   clonazePAM 0.5 MG tablet Commonly known as: KLONOPIN   phentermine 37.5 MG tablet Commonly known as: ADIPEX-P   pramipexole 0.125 MG tablet Commonly known as: MIRAPEX   sulfamethoxazole-trimethoprim 800-160 MG tablet Commonly known as: BACTRIM DS     TAKE these medications   fexofenadine-pseudoephedrine 60-120 MG 12 hr tablet Commonly known as: ALLEGRA-D Take 1 tablet by mouth daily as needed (for allergies).   fluticasone 50 MCG/ACT nasal spray Commonly known as: FLONASE Place 2 sprays into both nostrils daily as needed for allergies.    guaiFENesin 600 MG 12 hr tablet Commonly known as: MUCINEX Take 600 mg by mouth daily as needed for cough.   Iron 240 (27 Fe) MG Tabs Take 240 mg by mouth daily.   lansoprazole 30 MG capsule Commonly known as: PREVACID Take 30 mg by mouth daily.   linaclotide 145 MCG Caps capsule Commonly known as: Linzess Take 1 capsule (145 mcg total) by mouth daily before breakfast. What changed: Another medication with the same name was removed. Continue taking this medication, and follow the directions you see here.   multivitamin tablet Take 1 tablet by mouth daily.   OVER THE COUNTER MEDICATION Take 2 tablets by mouth daily. Hair Growth Supplement   prednisoLONE acetate 1 % ophthalmic suspension Commonly known as: PRED FORTE Place 1 drop into the right eye 2 (two) times a day.   SALMON OIL-1000 PO Take 2,000 mg by mouth daily.   vitamin C 500 MG tablet Commonly known as: ASCORBIC ACID Take 500 mg by mouth daily.   Vitamin D3 50 MCG (2000 UT) Tabs Take 2,000 Units by mouth daily.        Disposition: Discharged to inpatient psychiatric hospital   Discharged Condition: AUNESTI PELLEGRINO is medically stable and cleared for discharge to inpatient behavioral health.    Time spent on disposition:  32 Minutes.   Signed: Hayden Pedro, AGACNP-BC [facility]  PCCM Pgr: [phone]

## 2019-04-27 NOTE — Progress Notes (Signed)
PCCM Progress note  I called and updated Mr. Sara Barnett, the father over telephone.  Reviewed presentation after suicide attempt and care she recieved.  Father is aware that Sara Barnett will get transferred to [facility] when bed is available.  He asked if he could visit her but unfortunately due to COVID restrictions we are still limiting family visitation.  Sara Garfinkel MD [facility] 04/27/2019, 1:50 PM

## 2019-04-27 NOTE — Plan of Care (Signed)
Problem: Self-Concept: Goal: Will verbalize positive feelings about self Outcome: Progressing   Problem: Coping: Goal: Coping ability will improve Outcome: Progressing   Problem: Health Behavior/Discharge Planning: Goal: Identification of resources available to assist in meeting health care needs will improve Outcome: Progressing   Problem: Medication: Goal: Compliance with prescribed medication regimen will improve Outcome: Progressing

## 2019-04-27 NOTE — Progress Notes (Signed)
   NAME:  Sara Barnett, MRN:  [id number], DOB:  06/19/1966, LOS: 2 ADMISSION DATE:  04/24/2019, CONSULTATION DATE:  04/25/19 REFERRING MD:  ER, CHIEF COMPLAINT:  Overdose   Brief History   53 year old woman w/ hx of depression presenting after intentional overdose. Utox with amphetamines otherwise neg, EtOH 54.  Initial EKG with benign intervals.  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  Significant Hospital Events   6/28- Admission, shock requiring pressors 6/29-Weaned off pressors.  Psychiatry recommending admission to behavioral health  Consults:  Psychiatry  Procedures:  None  Significant Diagnostic Tests:  Chest x-ray 6/27-no active cardiopulmonary disease.  I reviewed the images personally.  Micro Data:  SARS-CoV-2 6/28-negative  Antimicrobials:  None   Interim history/subjective:  Transfer out of ICU.  No complaints Ate breakfast.   Had no urine output overnight but started picking up after breakfast  Objective   Blood pressure 106/70, pulse 74, temperature 99.3 F (37.4 C), temperature source Oral, resp. rate 20, height 5\' 1"  (1.549 m), weight 76.5 kg, SpO2 93 %.        Intake/Output Summary (Last 24 hours) at 04/27/2019 1200 Last data filed at 04/27/2019 1113 Gross per 24 hour  Intake 440 ml  Output 750 ml  Net -310 ml   Filed Weights   04/25/19 0354 04/27/19 0800  Weight: 74 kg 76.5 kg    Examination: Gen:      No acute distress HEENT:  EOMI, sclera anicteric Neck:     No masses; no thyromegaly Lungs:    Clear to auscultation  bilaterally; normal respiratory effort CV:         Regular rate and rhythm; no murmurs Abd:      + bowel sounds; soft, non-tender; no palpable masses, no distension Ext:    No edema; adequate peripheral perfusion Skin:      Warm and dry; no rash Neuro: alert and oriented x 3  Resolved Hospital Problem list   NA  Assessment & Plan:  Intentional overdose with shock- question of CCB overdose.  Given calcium and 4L fluids as well as geodon and ativan prior to transfer.  EKG reassuring. Underlying depression/anxiety Hemodynamically stable.  Off pressors for greater than 24 hours  Depression, suicidal attempt Evaluated by psychiatry.  Will need inpatient admission to psychiatric hospital  Hypertension, GERD Protonix Holding Norvasc for now  Best practice:  Diet: P.o. diet Pain/Anxiety/Delirium protocol (if indicated): None VAP protocol (if indicated): None DVT prophylaxis: Lovenox GI prophylaxis: PTA PPI Glucose control: SSI Mobility: Bedrest Code Status: Full Family Communication: Patient updated. I called father 6/30 but got voice mail. Left message Disposition: Stable for discharge.  Marshell Garfinkel MD [facility] 04/27/2019, 12:08 PM

## 2019-04-27 NOTE — Progress Notes (Addendum)
CSW acknowledges inpatient psych rec by Psychiatry. CSW will fax out for bed availability. CSW acknowledges patient IVC'd on 6/28, next renewal of IVC due on 7/5.   Update: CSW attempted to make referral, awaiting call back from [facility].   [place], [place]

## 2019-04-27 NOTE — Consult Note (Signed)
   [facility] CM Inpatient Consult   04/27/2019  Sara Barnett Feb 01, 1966 [id number]    Patienthas been active with [facility] Care Management. Our community based plan of care has focused [employer] and [employer] support.Patient had been engaged by [facility] for assistance with community resources.  Plan: Patient will be followed by an external care management team with [facility] fordiseasecare management services with [employer].   Alerted THNsocial workerof hospitalization/ current disposition.   For additional questions,please contact:  Aylani Spurlock A. Cathren Sween, BSN, RN-BC [facility] Liaison Cell: [phone]

## 2019-04-27 NOTE — Progress Notes (Addendum)
Bladder scan 258mL. MD paged. Awaiting MD response. Will continue to monitor.  Per Vaughan Browner, MD okay to I&O cath. Pt refusing I&O cath till after breakfast. [place], MD aware. Will continue to monitor.

## 2019-04-28 LAB — GLUCOSE, CAPILLARY
Glucose-Capillary: 96 mg/dL (ref 70–99)
Glucose-Capillary: 86 mg/dL (ref 70–99)
Glucose-Capillary: 135 mg/dL — ABNORMAL HIGH (ref 70–99)

## 2019-04-28 NOTE — Progress Notes (Signed)
   NAME:  Sara Barnett, MRN:  [id number], DOB:  1966/10/20, LOS: 3 ADMISSION DATE:  04/24/2019, CONSULTATION DATE:  04/25/19 REFERRING MD:  ER, CHIEF COMPLAINT:  Overdose   Brief History   53 year old woman w/ hx of depression presenting after intentional overdose. Utox with amphetamines otherwise neg, EtOH 54.  Initial EKG with benign intervals.  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  Significant Hospital Events   6/28- Admission, shock requiring pressors 6/29-Weaned off pressors.  Psychiatry recommending admission to behavioral health 7/1- Awaiting bed at [facility]  Consults:  Psychiatry  Procedures:  None  Significant Diagnostic Tests:  Chest x-ray 6/27-no active cardiopulmonary disease.  I reviewed the images personally.  Micro Data:  SARS-CoV-2 6/28-negative  Antimicrobials:  None   Interim history/subjective:  No acute events.  Objective   Blood pressure 119/78, pulse 78, temperature (!) 97.5 F (36.4 C), temperature source Oral, resp. rate 16, height 5\' 1"  (1.549 m), weight 76.1 kg, SpO2 100 %.        Intake/Output Summary (Last 24 hours) at 04/28/2019 1531 Last data filed at 04/28/2019 0830 Gross per 24 hour  Intake -  Output 1400 ml  Net -1400 ml   Filed Weights   04/25/19 0354 04/27/19 0800 04/28/19 0604  Weight: 74 kg 76.5 kg 76.1 kg    Examination: Gen:      No acute distress HEENT:  EOMI, sclera anicteric Neck:     No masses; no thyromegaly Lungs:    Clear to auscultation bilaterally; normal respiratory effort CV:         Regular  rate and rhythm; no murmurs Abd:      + bowel sounds; soft, non-tender; no palpable masses, no distension Ext:    No edema; adequate peripheral perfusion Skin:      Warm and dry; no rash Neuro: alert and oriented x 3  Resolved Hospital Problem list   NA  Assessment & Plan:  Intentional overdose with shock- question of CCB overdose.  Given calcium and 4L fluids as well as geodon and ativan prior to transfer.  EKG reassuring. Underlying depression/anxiety Hemodynamically stable.   Depression, suicidal attempt Continue sitter Awaiting placement at [facility] pending bed availability  Hypertension, GERD Protonix Restart norvasc  Best practice:  Diet: P.o. diet Pain/Anxiety/Delirium protocol (if indicated): None VAP protocol (if indicated): None DVT prophylaxis: Lovenox GI prophylaxis: PTA PPI Glucose control: SSI Mobility: Bedrest Code Status: Full Family Communication: Updated father 6/30  Marshell Garfinkel MD  Pulmonary and Critical Care 04/28/2019, 3:31 PM

## 2019-04-28 NOTE — Progress Notes (Signed)
No beds at [facility] today, will continue to follow up for psych bed avail.   [place], [place]

## 2019-04-29 MED ORDER — CLONAZEPAM 0.5 MG PO TABS
0.5000 mg | ORAL_TABLET | Freq: Three times a day (TID) | ORAL | Status: DC | PRN
  Administered 2019-04-29 – 2019-04-30 (×3): 0.5 mg via ORAL
  Filled 2019-04-29 (×3): qty 1

## 2019-04-29 MED ORDER — BISACODYL 5 MG PO TBEC
5.0000 mg | DELAYED_RELEASE_TABLET | Freq: Every day | ORAL | Status: DC | PRN
  Administered 2019-04-29 (×2): 5 mg via ORAL
  Filled 2019-04-29 (×2): qty 1

## 2019-04-29 NOTE — Progress Notes (Signed)
   NAME:  Sara Barnett, MRN:  [id number], DOB:  May 03, 1966, LOS: 4 ADMISSION DATE:  04/24/2019, CONSULTATION DATE:  04/25/19 REFERRING MD:  ER, CHIEF COMPLAINT:  Overdose   Brief History   53 year old woman w/ hx of depression presenting after intentional overdose. Utox with amphetamines otherwise neg, EtOH 54.  Initial EKG with benign intervals.  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  Significant Hospital Events   6/28- Admission, shock requiring pressors 6/29-Weaned off pressors.  Psychiatry recommending admission to behavioral health 7/1- Awaiting bed at [facility]  Consults:  Psychiatry  Procedures:  None  Significant Diagnostic Tests:  Chest x-ray 6/27-no active cardiopulmonary disease.  I reviewed the images personally.  Micro Data:  SARS-CoV-2 6/28-negative  Antimicrobials:  None   Interim history/subjective:  No acute events.   Objective   Blood pressure 113/76, pulse 73, temperature (!) 97.5 F (36.4 C), temperature source Oral, resp. rate 18, height 5\' 1"  (1.549 m), weight 76.1 kg, SpO2 95 %.        Intake/Output Summary (Last 24 hours) at 04/29/2019 1655 Last data filed at 04/29/2019 1254 Gross per 24 hour  Intake 360 ml  Output 1650 ml  Net -1290 ml   Filed Weights   04/25/19 0354 04/27/19 0800 04/28/19 0604  Weight: 74 kg 76.5 kg 76.1 kg    Examination: Gen:      No acute distress HEENT:  EOMI, sclera anicteric Neck:     No masses; no thyromegaly Lungs:    Clear to auscultation bilaterally; normal respiratory effort CV:          Regular rate and rhythm; no murmurs Abd:      + bowel sounds; soft, non-tender; no palpable masses, no distension Ext:    No edema; adequate peripheral perfusion Skin:      Warm and dry; no rash Neuro: alert and oriented x 3 Psych: normal mood and affect  Resolved Hospital Problem list   NA  Assessment & Plan:  Intentional overdose with shock- question of CCB overdose.  Given calcium and 4L fluids as well as geodon and ativan prior to transfer.  EKG reassuring. Underlying depression/anxiety Hemodynamically stable.   Depression, suicidal attempt Continue sitter Awaiting placement at [facility] pending bed availability  Hypertension, GERD Protonix Mont[place] BP  TRH to pick up tomorrow. Signed out to Dr. Elta Guadeloupe MD [place] 04/29/2019, 4:55 PM

## 2019-04-29 NOTE — Progress Notes (Signed)
CSW called [facility] who reports patient is at the top of their list for a bed today and will call CSW to inform when one opens up. They requested faxed IVC, CSW faxing.   [place], [place]

## 2019-04-29 NOTE — Care Management Important Message (Signed)
Important Message  Patient Details  Name: Sara Barnett MRN: [id number] Date of Birth: May 09, 1966   Medicare Important Message Given:  Yes     Memory Argue 04/29/2019, 2:23 PM

## 2019-04-29 NOTE — Progress Notes (Signed)
[place] reports they are still waiting on the bed avail, if they get it after CSW leaves for today they will call floor nurse.   [place] at [place], [place]

## 2019-04-29 NOTE — Progress Notes (Signed)
eLink Physician-Brief Progress Note Patient Name: Sara Barnett DOB: Mar 12, 1966 MRN: [id number]   Date of Service  04/29/2019  HPI/Events of Note  Pt s/p suicide attempt. Currently experiencing intense anxiety. Also constipated.  eICU Interventions  Klonopin 0.5 mg op tid prn, Dulcolax pill 1 daily prn x 3 days        Trevon Strothers U Braylynn Ghan 04/29/2019, 2:11 AM

## 2019-04-30 LAB — URINALYSIS, ROUTINE W REFLEX MICROSCOPIC
Bilirubin Urine: NEGATIVE
Glucose, UA: NEGATIVE mg/dL
Hgb urine dipstick: NEGATIVE
Ketones, ur: NEGATIVE mg/dL
Nitrite: NEGATIVE
Protein, ur: NEGATIVE mg/dL
Specific Gravity, Urine: 1.008 (ref 1.005–1.030)
WBC, UA: >50 WBC/hpf — ABNORMAL HIGH (ref 0–5)
pH: 7 (ref 5.0–8.0)

## 2019-04-30 MED ORDER — SENNOSIDES-DOCUSATE SODIUM 8.6-50 MG PO TABS
2.0000 | ORAL_TABLET | Freq: Two times a day (BID) | ORAL | Status: DC
  Administered 2019-04-30: 2 via ORAL
  Filled 2019-04-30 (×2): qty 2

## 2019-04-30 MED ORDER — TRAZODONE HCL 50 MG PO TABS
100.0000 mg | ORAL_TABLET | Freq: Every day | ORAL | Status: DC
  Administered 2019-04-30: 100 mg via ORAL
  Filled 2019-04-30: qty 2

## 2019-04-30 MED ORDER — HEPARIN SODIUM (PORCINE) 5000 UNIT/ML IJ SOLN
5000.0000 [IU] | Freq: Three times a day (TID) | INTRAMUSCULAR | Status: DC
  Administered 2019-04-30 – 2019-05-01 (×4): 5000 [IU] via SUBCUTANEOUS
  Filled 2019-04-30 (×4): qty 1

## 2019-04-30 NOTE — Plan of Care (Signed)
  Problem: Coping: Goal: Ability to interact with others will improve Outcome: Progressing   Problem: Education: Goal: Ability to make informed decisions regarding treatment will improve Outcome: Progressing   Problem: Health Behavior/Discharge Planning: Goal: Identification of resources available to assist in meeting health care needs will improve Outcome: Progressing

## 2019-04-30 NOTE — Progress Notes (Signed)
Patient Demographics:    Sara Barnett, is a 53 y.o. female, DOB - February 09, 1966, VZC:[id number]  Admit date - 04/24/2019   Admitting Physician Renee Pain, MD  Outpatient Primary MD for the patient is Celene Squibb, MD  LOS - 5   Chief Complaint  Patient presents with  . Drug Overdose        Subjective:    Sara Barnett today has no fevers, no emesis,  No chest pain,  ??  Urinary frequency, will check UA  Assessment  & Plan :    Principal Problem:   Suicide attempt [facility]) Active Problems:   Drug overdose, intentional self-harm, initial encounter ([place])   Shock ([place])  Brief Summary:- 53 year old woman with hx anxiety/depression, GERD, HTN, RLS Admitted on 04/25/2019 after intentional overdose of some combination of benadryl, amlodipine, clonazepam, alcohol and amphetamines.    She was initially hypotensive and obtunded at outside ER but then woke up agitated and required IRS, transferred to [facility]  for ongoing shock, required low-dose Levophed. --Utox with amphetamines otherwise neg, EtOH 54.  Initial EKG with benign intervals -Weaned off pressors on 04/26/2019 Transferred from PCCM service to hospitalist service on 05/17/2019 -Awaiting inpatient psychiatric treatment as advised by consulting psychiatrist   A/p 1) intentional overdose/suicide attempt--- off pressors, hypotension which resolved, --psychiatric consult appreciated, patient is medically stable for transfer to psychiatric unit.  Awaiting transfer to [facility].. ... Okay to use clonazepam as needed anxiety, use trazodone for sleep  2)HTN--- PTA patient was on amlodipine and benazepril, BP currently stable without any BP medications continue to monitor  3)GERD--stable, continue Protonix  Disposition/Need for in-Hospital Stay- patient unable to be discharged at this time due to awaiting psychiatric inpatient bed possibly at [facility] [place]  Code Status :  Full code  Family Communication:   NA (patient is alert, awake and coherent)   Disposition Plan  : [facility]/inpatient psych  Consults  :  PCCM/Psych  DVT Prophylaxis  :   - heparin  Lab Results  Component Value Date   PLT 255 04/25/2019    Inpatient Medications  Scheduled Meds: . heparin injection (subcutaneous)  5,000 Units Subcutaneous Q8H  . pantoprazole  40 mg Oral Daily  . prednisoLONE acetate  1 drop Right Eye BID  . traZODone  100 mg Oral QHS  . vitamin C  500 mg Oral Daily   Continuous Infusions: . sodium chloride     PRN Meds:.acetaminophen, bisacodyl, clonazePAM, ondansetron (ZOFRAN) IV    Anti-infectives (From admission, onward)   None        Objective:   Vitals:   04/29/19 1651 04/29/19 2036 04/30/19 1114 04/30/19 1608  BP: 119/87 120/85 120/83 (!) 120/91  Pulse: 81 80 79 78  Resp: 16 14 18 16   Temp: 98.4 F (36.9 C) 97.9 F (36.6 C) (!) 97.4 F (36.3 C) 98.5 F (36.9 C)  TempSrc: Oral Oral Oral Oral  SpO2: 99% 99% 96% 97%  Weight:      Height:        Wt Readings from Last 3 Encounters:  04/28/19 76.1 kg  10/12/18 72.6 kg  09/11/18 72.6 kg     Intake/Output Summary (Last 24 hours) at 04/30/2019 1615 Last data filed at 04/30/2019 1300 Gross per  24 hour  Intake 480 ml  Output 2300 ml  Net -1820 ml     Physical Exam  Gen:- Awake Alert,  In no apparent distress  HEENT:- [place].AT, No sclera icterus Eyes--visual disturbance is not new Neck-Supple Neck,No JVD,.  Lungs-  CTAB , fair symmetrical air movement CV- S1, S2 normal, regular  Abd-  +ve B.Sounds, Abd Soft, No tenderness,    Extremity/Skin:- No  edema, pedal pulses present  Psych-affect is appropriate, oriented x3 Neuro-no new focal deficits, no tremors   Data Review:   Micro Results Recent Results (from the past 240 hour(s))  SARS Coronavirus 2 (CEPHEID - Performed in [place] hospital lab), Hosp Order     Status: None   Collection Time: 04/25/19 12:45 AM   Specimen:  Nasopharyngeal Swab  Result Value Ref Range Status   SARS Coronavirus 2 NEGATIVE NEGATIVE Final    Comment: (NOTE) If result is NEGATIVE SARS-CoV-2 target nucleic acids are NOT DETECTED. The SARS-CoV-2 RNA is generally detectable in upper and lower  respiratory specimens during the acute phase of infection. The lowest  concentration of SARS-CoV-2 viral copies this assay can detect is 250  copies / mL. A negative result does not preclude SARS-CoV-2 infection  and should not be used as the sole basis for treatment or other  patient management decisions.  A negative result may occur with  improper specimen collection / handling, submission of specimen other  than nasopharyngeal swab, presence of viral mutation(s) within the  areas targeted by this assay, and inadequate number of viral copies  (<250 copies / mL). A negative result must be combined with clinical  observations, patient history, and epidemiological information. If result is POSITIVE SARS-CoV-2 target nucleic acids are DETECTED. The SARS-CoV-2 RNA is generally detectable in upper and lower  respiratory specimens dur ing the acute phase of infection.  Positive  results are indicative of active infection with SARS-CoV-2.  Clinical  correlation with patient history and other diagnostic information is  necessary to determine patient infection status.  Positive results do  not rule out bacterial infection or co-infection with other viruses. If result is PRESUMPTIVE POSTIVE SARS-CoV-2 nucleic acids MAY BE PRESENT.   A presumptive positive result was obtained on the submitted specimen  and confirmed on repeat testing.  While 2019 novel coronavirus  (SARS-CoV-2) nucleic acids may be present in the submitted sample  additional confirmatory testing may be necessary for epidemiological  and / or clinical management purposes  to differentiate between  SARS-CoV-2 and other Sarbecovirus currently known to infect humans.  If clinically  indicated additional testing with an alternate test  methodology [phone]) is advised. The SARS-CoV-2 RNA is generally  detectable in upper and lower respiratory sp ecimens during the acute  phase of infection. The expected result is Negative. Fact Sheet for Patients:  StrictlyIdeas.no Fact Sheet for Healthcare Providers: BankingDealers.co.za This test is not yet approved or cleared by the [place] FDA and has been authorized for detection and/or diagnosis of SARS-CoV-2 by FDA under an Emergency Use Authorization (EUA).  This EUA will remain in effect (meaning this test can be used) for the duration of the COVID-19 declaration under Section 564(b)(1) of the Act, 21 U.S.C. section 360bbb-3(b)(1), unless the authorization is terminated or revoked sooner. Performed at [facility], [address], [place], [place] [address]   MRSA PCR Screening     Status: None   Collection Time: 04/25/19 11:07 AM   Specimen: Nasal Mucosa; Nasopharyngeal  Result Value Ref  Range Status   MRSA by PCR NEGATIVE NEGATIVE Final    Comment:        The GeneXpert MRSA Assay (FDA approved for NASAL specimens only), is one component of a comprehensive MRSA colonization surveillance program. It is not intended to diagnose MRSA infection nor to guide or monitor treatment for MRSA infections. Performed at [facility], [place] [address], [place],  [address]     Radiology Reports Ct Head Wo Contrast  Result Date: 04/25/2019 CLINICAL DATA:  Altered level of consciousness. EXAM: CT HEAD WITHOUT CONTRAST TECHNIQUE: Contiguous axial images were obtained from the base of the skull through the vertex without intravenous contrast. COMPARISON:  CT head dated August 25, 2008 not be retrieved for comparison. The dictation is available for review. FINDINGS: Brain: No evidence of acute infarction, hemorrhage, hydrocephalus, extra-axial collection or mass  lesion/mass effect. Vascular: No hyperdense vessel or unexpected calcification. Skull: Normal. Negative for fracture or focal lesion. Sinuses/Orbits: No acute finding. Other: None. IMPRESSION: No acute intracranial abnormality. Electronically Signed   By: Constance Holster M.D.   On: 04/25/2019 00:21   Dg Chest Portable 1 View  Result Date: 04/24/2019 CLINICAL DATA:  54 year old female with shortness of breath EXAM: PORTABLE CHEST 1 VIEW COMPARISON:  Chest radiograph dated 06/27/2015 FINDINGS: Diffuse interstitial coarsening. No focal consolidation, pleural effusion, or pneumothorax. The cardiac silhouette is within normal limits. No acute osseous pathology. IMPRESSION: No active disease. Electronically Signed   By: Anner Crete M.D.   On: 04/24/2019 23:01     CBC Recent Labs  Lab 04/24/19 2234 04/25/19 1249  WBC 7.5 8.2  HGB 14.2 15.8*  HCT 44.3 46.5*  PLT 274 255  MCV 95.7 90.3  MCH 30.7 30.7  MCHC 32.1 34.0  RDW 12.5 11.8  LYMPHSABS 1.2  --   MONOABS 0.3  --   EOSABS 0.2  --   BASOSABS 0.1  --     Chemistries  Recent Labs  Lab 04/24/19 2234 04/25/19 1249 04/26/19 0220  NA 141  --  139  K 3.3*  --  3.1*  CL 112*  --  109  CO2 17*  --  23  GLUCOSE 83  --  90  BUN 9  --  <5*  CREATININE 0.88 0.71 0.63  CALCIUM 8.3*  --  8.4*  MG  --   --  2.0  AST 15  --   --   ALT 15  --   --   ALKPHOS 62  --   --   BILITOT 0.5  --   --    ------------------------------------------------------------------------------------------------------------------ No results for input(s): CHOL, HDL, LDLCALC, TRIG, CHOLHDL, LDLDIRECT in the last 72 hours.  No results found for: HGBA1C ------------------------------------------------------------------------------------------------------------------ No results for input(s): TSH, T4TOTAL, T3FREE, THYROIDAB in the last 72 hours.  Invalid input(s): FREET3  ------------------------------------------------------------------------------------------------------------------ No results for input(s): VITAMINB12, FOLATE, FERRITIN, TIBC, IRON, RETICCTPCT in the last 72 hours.  Coagulation profile No results for input(s): INR, PROTIME in the last 168 hours.  No results for input(s): DDIMER in the last 72 hours.  Cardiac Enzymes No results for input(s): CKMB, TROPONINI, MYOGLOBIN in the last 168 hours.  Invalid input(s): CK ------------------------------------------------------------------------------------------------------------------ No results found for: BNP   Roxan Hockey M.D on 04/30/2019 at 4:15 PM  Go to [email or web address] - for contact info  [facility] - Office  [phone]

## 2019-04-30 NOTE — Progress Notes (Signed)
CSW sent referral packet to Judson Roch in [place] and Assessment @[place]  [place], [facility].  CSW will continue to follow for placement.  Areatha Keas. Judi Cong, MSW, [place] Disposition Clinical Social Work [phone] (cell) [phone] (office)

## 2019-04-30 NOTE — Progress Notes (Signed)
CSW contacted [place], CSW, to request copy of IVC on patient.  CSW wil confer with [facility] regarding bed.  Patient is cleared medically and continues to meet inpatient criteria. CSW will advise on bed availability at [facility] or assist CSW with placing patient elsewhere should she require assistance.  Areatha Keas. Judi Cong, MSW, [place] Disposition Clinical Social Work [phone] (cell) [phone] (office)

## 2019-05-01 ENCOUNTER — Inpatient Hospital Stay (HOSPITAL_COMMUNITY)
Admission: AD | Admit: 2019-05-01 | Discharge: 2019-05-05 | DRG: 885 | Disposition: A | Discharge status: Home / Self Care | Payer: PPO | Attending: Psychiatry | Admitting: Psychiatry

## 2019-05-01 ENCOUNTER — Encounter (HOSPITAL_COMMUNITY): Payer: Self-pay | Admitting: *Deleted

## 2019-05-01 ENCOUNTER — Other Ambulatory Visit: Payer: Self-pay

## 2019-05-01 DIAGNOSIS — F322 Major depressive disorder, single episode, severe without psychotic features: Principal | ICD-10-CM | POA: Diagnosis present

## 2019-05-01 DIAGNOSIS — N390 Urinary tract infection, site not specified: Secondary | ICD-10-CM | POA: Diagnosis not present

## 2019-05-01 DIAGNOSIS — T450X1A Poisoning by antiallergic and antiemetic drugs, accidental (unintentional), initial encounter: Secondary | ICD-10-CM | POA: Diagnosis not present

## 2019-05-01 DIAGNOSIS — Z87891 Personal history of nicotine dependence: Secondary | ICD-10-CM

## 2019-05-01 DIAGNOSIS — G4700 Insomnia, unspecified: Secondary | ICD-10-CM | POA: Diagnosis not present

## 2019-05-01 DIAGNOSIS — K219 Gastro-esophageal reflux disease without esophagitis: Secondary | ICD-10-CM | POA: Diagnosis not present

## 2019-05-01 DIAGNOSIS — F419 Anxiety disorder, unspecified: Secondary | ICD-10-CM | POA: Diagnosis present

## 2019-05-01 DIAGNOSIS — R579 Shock, unspecified: Secondary | ICD-10-CM | POA: Diagnosis not present

## 2019-05-01 DIAGNOSIS — T50902A Poisoning by unspecified drugs, medicaments and biological substances, intentional self-harm, initial encounter: Secondary | ICD-10-CM | POA: Diagnosis not present

## 2019-05-01 DIAGNOSIS — K589 Irritable bowel syndrome without diarrhea: Secondary | ICD-10-CM | POA: Diagnosis not present

## 2019-05-01 DIAGNOSIS — H548 Legal blindness, as defined in USA: Secondary | ICD-10-CM | POA: Diagnosis not present

## 2019-05-01 DIAGNOSIS — T1491XA Suicide attempt, initial encounter: Secondary | ICD-10-CM | POA: Diagnosis not present

## 2019-05-01 LAB — GLUCOSE, CAPILLARY
Glucose-Capillary: 86 mg/dL (ref 70–99)
Glucose-Capillary: 178 mg/dL — ABNORMAL HIGH (ref 70–99)

## 2019-05-01 LAB — COMPREHENSIVE METABOLIC PANEL
ALT: 14 U/L (ref 0–44)
AST: 13 U/L — ABNORMAL LOW (ref 15–41)
Albumin: 3.1 g/dL — ABNORMAL LOW (ref 3.5–5.0)
Alkaline Phosphatase: 68 U/L (ref 38–126)
Anion gap: 8 (ref 5–15)
BUN: 8 mg/dL (ref 6–20)
CO2: 25 mmol/L (ref 22–32)
Calcium: 8.9 mg/dL (ref 8.9–10.3)
Chloride: 104 mmol/L (ref 98–111)
Creatinine, Ser: 0.77 mg/dL (ref 0.44–1.00)
GFR calc Af Amer: >60 mL/min (ref >60)
GFR calc non Af Amer: >60 mL/min (ref >60)
Glucose, Bld: 129 mg/dL — ABNORMAL HIGH (ref 70–99)
Potassium: 3.9 mmol/L (ref 3.5–5.1)
Sodium: 137 mmol/L (ref 135–145)
Total Bilirubin: 0.7 mg/dL (ref 0.3–1.2)
Total Protein: 6.1 g/dL — ABNORMAL LOW (ref 6.5–8.1)

## 2019-05-01 LAB — CBC
HCT: 40.6 % (ref 36.0–46.0)
Hemoglobin: 14 g/dL (ref 12.0–15.0)
MCH: 31.3 pg (ref 26.0–34.0)
MCHC: 34.5 g/dL (ref 30.0–36.0)
MCV: 90.6 fL (ref 80.0–100.0)
Platelets: 264 10*3/uL (ref 150–400)
RBC: 4.48 MIL/uL (ref 3.87–5.11)
RDW: 12.2 % (ref 11.5–15.5)
WBC: 5.9 10*3/uL (ref 4.0–10.5)
nRBC: 0 % (ref 0.0–0.2)

## 2019-05-01 LAB — URINALYSIS, ROUTINE W REFLEX MICROSCOPIC
Bilirubin Urine: NEGATIVE
Glucose, UA: NEGATIVE mg/dL
Ketones, ur: NEGATIVE mg/dL
Nitrite: POSITIVE — AB
Protein, ur: NEGATIVE mg/dL
Specific Gravity, Urine: 1.009 (ref 1.005–1.030)
WBC, UA: >50 WBC/hpf — ABNORMAL HIGH (ref 0–5)
pH: 5 (ref 5.0–8.0)

## 2019-05-01 MED ORDER — MAGNESIUM HYDROXIDE 400 MG/5ML PO SUSP
30.0000 mL | Freq: Every day | ORAL | Status: DC | PRN
  Administered 2019-05-03: 30 mL via ORAL
  Filled 2019-05-01: qty 30

## 2019-05-01 MED ORDER — CEPHALEXIN 500 MG PO CAPS
500.0000 mg | ORAL_CAPSULE | Freq: Three times a day (TID) | ORAL | Status: DC
  Filled 2019-05-01 (×2): qty 1

## 2019-05-01 MED ORDER — PANTOPRAZOLE SODIUM 40 MG PO TBEC
40.0000 mg | DELAYED_RELEASE_TABLET | Freq: Every day | ORAL | Status: DC
  Administered 2019-05-01 – 2019-05-05 (×5): 40 mg via ORAL
  Filled 2019-05-01 (×8): qty 1

## 2019-05-01 MED ORDER — CEPHALEXIN 500 MG PO CAPS
500.0000 mg | ORAL_CAPSULE | Freq: Three times a day (TID) | ORAL | Status: DC
  Administered 2019-05-01 – 2019-05-05 (×11): 500 mg via ORAL
  Filled 2019-05-01 (×5): qty 1
  Filled 2019-05-01: qty 2
  Filled 2019-05-01 (×2): qty 1
  Filled 2019-05-01: qty 2
  Filled 2019-05-01 (×10): qty 1

## 2019-05-01 MED ORDER — ALUM & MAG HYDROXIDE-SIMETH 200-200-20 MG/5ML PO SUSP: 30.0000 mL | ORAL | Status: DC | PRN

## 2019-05-01 MED ORDER — CLONAZEPAM 0.5 MG PO TABS
0.5000 mg | ORAL_TABLET | Freq: Two times a day (BID) | ORAL | Status: DC | PRN
  Administered 2019-05-01 – 2019-05-04 (×6): 0.5 mg via ORAL
  Filled 2019-05-01 (×6): qty 1

## 2019-05-01 MED ORDER — TRAZODONE HCL 100 MG PO TABS
100.0000 mg | ORAL_TABLET | Freq: Every evening | ORAL | Status: DC | PRN
  Administered 2019-05-02: 100 mg via ORAL
  Filled 2019-05-01 (×3): qty 1

## 2019-05-01 MED ORDER — CEPHALEXIN 500 MG PO CAPS: 500.0000 mg | ORAL_CAPSULE | Freq: Three times a day (TID) | ORAL | 0 refills | Status: AC

## 2019-05-01 NOTE — Discharge Summary (Signed)
Physician Discharge Summary  Sara Barnett YBW:[id number] DOB: 01-May-1966 DOA: 04/24/2019  PCP: Celene Squibb, MD  Admit date: 04/24/2019 Discharge date: 05/01/2019  Admitted From: Home Disposition: [facility]  Recommendations for Outpatient Follow-up:  1. Follow up with PCP in 1-2 weeks 2. Please obtain CBC/BMP/Mag at follow up 3. Please follow up on the following pending results: Urinalysis and urine culture  Home Health: Not applicable Equipment/Devices: Not applicable  Discharge Condition: Stable CODE STATUS: Full code  Hospital Course: 53 year old woman with hx anxiety/depression, poor vision, GERD, HTN, RLS admitted on 04/25/2019 after intentional overdose of some combination of benadryl, amlodipine, clonazepam, alcohol and amphetamines.  She was initially hypotensive requiring pressors.  She was also obtunded at outside ER but then woke up agitated.  She was transferred to [facility]  for ongoing shock, required low-dose Levophed.  Psychiatry consulted and recommended inpatient psych service.  Urine toxicology with amphetamines otherwise neg.  EtOH 54. Initial EKG sinus rhythm with borderline PR interval.  He was weaned off pressors on 6/29/202 and transferred from [facility] service to hospitalist service on 04/29/2019.  She remained medically stable awaiting for inpatient psychiatry placement.  See individual problems below for more.  Discharge Diagnoses:  Intentional overdose/suicide attempt/history of anxiety/depression: Medically stable. -Transfer to behavioral health Hospital  HTN- on amlodipine and benazepril at home. BP currently stable without any BP medications -Discontinued home medications on discharge.  GERD--stable, continue Protonix  Dysuria: Patient endorses dysuria.  Ordered urinalysis and urine culture and later realized that she had urinalysis from 04/30/19 with large LE concerning for UTI. -Added Keflex 500 mg 3 times daily for 7 days. -I will  follow urine culture. -Communicated this to the nurse practitioner on call at [facility] over the phone.  Discharge Instructions  Discharge Instructions    Diet general   Complete by: As directed    Increase activity slowly   Complete by: As directed      Allergies as of 05/01/2019      Reactions   Methylprednisolone Anaphylaxis, Other (See Comments)   CARDIAC ARREST/PEA    Augmentin [amoxicillin-pot Clavulanate] Other (See Comments)   "FEELING HOT"; EXTREME SLEEPINESS   Doxycycline Itching, Other (See Comments)   THROAT SWELLING   Hydrocodone-acetaminophen Itching, Other (See Comments)   THROAT SWELLING   Statins Other (See Comments)   JOINT PAIN, swellling   Other Other (See Comments)   Steroids - Unknown drug and strength, stopped patient's heart      Medication List    STOP taking these medications   amLODipine 10 MG tablet Commonly known as: NORVASC   amLODipine-benazepril 10-40 MG capsule Commonly known as: LOTREL   benazepril 40 MG tablet Commonly known as: LOTENSIN   buPROPion 100 MG 12 hr tablet Commonly known as: WELLBUTRIN SR   clonazePAM 0.5 MG tablet Commonly known as: KLONOPIN   phentermine 37.5 MG tablet Commonly known as: ADIPEX-P   pramipexole 0.125 MG tablet Commonly known as: MIRAPEX   sulfamethoxazole-trimethoprim 800-160 MG tablet Commonly known as: BACTRIM DS     TAKE these medications   cephALEXin 500 MG capsule Commonly known as: KEFLEX Take 1 capsule (500 mg total) by mouth 3 (three) times daily for 7 days.   fexofenadine-pseudoephedrine 60-120 MG 12 hr tablet Commonly known as: ALLEGRA-D Take 1 tablet by mouth daily as needed (for allergies).   fluticasone 50 MCG/ACT nasal spray Commonly known as: FLONASE Place 2 sprays into both nostrils daily as needed for allergies.  guaiFENesin 600 MG 12 hr tablet Commonly known as: MUCINEX Take 600 mg by mouth daily as needed for cough.   Iron 240 (27 Fe) MG Tabs Take 240 mg by mouth  daily.   lansoprazole 30 MG capsule Commonly known as: PREVACID Take 30 mg by mouth daily.   linaclotide 145 MCG Caps capsule Commonly known as: Linzess Take 1 capsule (145 mcg total) by mouth daily before breakfast. What changed: Another medication with the same name was removed. Continue taking this medication, and follow the directions you see here.   multivitamin tablet Take 1 tablet by mouth daily.   OVER THE COUNTER MEDICATION Take 2 tablets by mouth daily. Hair Growth Supplement   prednisoLONE acetate 1 % ophthalmic suspension Commonly known as: PRED FORTE Place 1 drop into the right eye 2 (two) times a day.   SALMON OIL-1000 PO Take 2,000 mg by mouth daily.   vitamin C 500 MG tablet Commonly known as: ASCORBIC ACID Take 500 mg by mouth daily.   Vitamin D3 50 MCG (2000 UT) Tabs Take 2,000 Units by mouth daily.        Consultations:  PCCM  Psychiatry  Procedures/Studies:  2D Echo: None  Ct Head Wo Contrast  Result Date: 04/25/2019 CLINICAL DATA:  Altered level of consciousness. EXAM: CT HEAD WITHOUT CONTRAST TECHNIQUE: Contiguous axial images were obtained from the base of the skull through the vertex without intravenous contrast. COMPARISON:  CT head dated August 25, 2008 not be retrieved for comparison. The dictation is available for review. FINDINGS: Brain: No evidence of acute infarction, hemorrhage, hydrocephalus, extra-axial collection or mass lesion/mass effect. Vascular: No hyperdense vessel or unexpected calcification. Skull: Normal. Negative for fracture or focal lesion. Sinuses/Orbits: No acute finding. Other: None. IMPRESSION: No acute intracranial abnormality. Electronically Signed   By: Constance Holster M.D.   On: 04/25/2019 00:21   Dg Chest Portable 1 View  Result Date: 04/24/2019 CLINICAL DATA:  53 year old female with shortness of breath EXAM: PORTABLE CHEST 1 VIEW COMPARISON:  Chest radiograph dated 06/27/2015 FINDINGS: Diffuse  interstitial coarsening. No focal consolidation, pleural effusion, or pneumothorax. The cardiac silhouette is within normal limits. No acute osseous pathology. IMPRESSION: No active disease. Electronically Signed   By: Anner Crete M.D.   On: 04/24/2019 23:01      Subjective: No major events overnight of this morning.  He says she felt a little bit lightheaded when she went to shower but did not fall.  Currently no chest pain, dyspnea, dizziness or palpitation.  Denies GI symptoms.  She endorses dysuria which is new.  Denies fever, chills or new back pain.  Discharge Exam: Vitals:   05/01/19 0751 05/01/19 1139  BP: 104/80 119/79  Pulse: 70 74  Resp: 15 17  Temp: 98.6 F (37 C) 98.3 F (36.8 C)  SpO2: 94% 98%    GENERAL: No acute distress.  Appears well.  HEENT: MMM.  Impaired vision. NECK: Supple.  No JVD.  LUNGS:  No IWOB. Good air movement bilaterally. HEART:  RRR. Heart sounds normal.  ABD: Bowel sounds present. Soft. Non tender.  MSK/EXT:  Moves all extremities. No apparent deformity. No edema bilaterally. SKIN: no apparent skin lesion or wound NEURO: Awake, alert and oriented appropriately.  No gross deficit.  PSYCH: Calm. Normal affect.  Denies SI or HI.  However, she states that there are triggers for SI.   The results of significant diagnostics from this hospitalization (including imaging, microbiology, ancillary and laboratory) are listed below for reference.  Microbiology: Recent Results (from the past 240 hour(s))  SARS Coronavirus 2 (CEPHEID - Performed in [place] hospital lab), Hosp Order     Status: None   Collection Time: 04/25/19 12:45 AM   Specimen: Nasopharyngeal Swab  Result Value Ref Range Status   SARS Coronavirus 2 NEGATIVE NEGATIVE Final    Comment: (NOTE) If result is NEGATIVE SARS-CoV-2 target nucleic acids are NOT DETECTED. The SARS-CoV-2 RNA is generally detectable in upper and lower  respiratory specimens during the acute phase of  infection. The lowest  concentration of SARS-CoV-2 viral copies this assay can detect is 250  copies / mL. A negative result does not preclude SARS-CoV-2 infection  and should not be used as the sole basis for treatment or other  patient management decisions.  A negative result may occur with  improper specimen collection / handling, submission of specimen other  than nasopharyngeal swab, presence of viral mutation(s) within the  areas targeted by this assay, and inadequate number of viral copies  (<250 copies / mL). A negative result must be combined with clinical  observations, patient history, and epidemiological information. If result is POSITIVE SARS-CoV-2 target nucleic acids are DETECTED. The SARS-CoV-2 RNA is generally detectable in upper and lower  respiratory specimens dur ing the acute phase of infection.  Positive  results are indicative of active infection with SARS-CoV-2.  Clinical  correlation with patient history and other diagnostic information is  necessary to determine patient infection status.  Positive results do  not rule out bacterial infection or co-infection with other viruses. If result is PRESUMPTIVE POSTIVE SARS-CoV-2 nucleic acids MAY BE PRESENT.   A presumptive positive result was obtained on the submitted specimen  and confirmed on repeat testing.  While 2019 novel coronavirus  (SARS-CoV-2) nucleic acids may be present in the submitted sample  additional confirmatory testing may be necessary for epidemiological  and / or clinical management purposes  to differentiate between  SARS-CoV-2 and other Sarbecovirus currently known to infect humans.  If clinically indicated additional testing with an alternate test  methodology [phone]) is advised. The SARS-CoV-2 RNA is generally  detectable in upper and lower respiratory sp ecimens during the acute  phase of infection. The expected result is Negative. Fact Sheet for Patients:   StrictlyIdeas.no Fact Sheet for Healthcare Providers: BankingDealers.co.za This test is not yet approved or cleared by the [place] FDA and has been authorized for detection and/or diagnosis of SARS-CoV-2 by FDA under an Emergency Use Authorization (EUA).  This EUA will remain in effect (meaning this test can be used) for the duration of the COVID-19 declaration under Section 564(b)(1) of the Act, 21 U.S.C. section 360bbb-3(b)(1), unless the authorization is terminated or revoked sooner. Performed at [facility], [address], [place], [place] [address]   MRSA PCR Screening     Status: None   Collection Time: 04/25/19 11:07 AM   Specimen: Nasal Mucosa; Nasopharyngeal  Result Value Ref Range Status   MRSA by PCR NEGATIVE NEGATIVE Final    Comment:        The GeneXpert MRSA Assay (FDA approved for NASAL specimens only), is one component of a comprehensive MRSA colonization surveillance program. It is not intended to diagnose MRSA infection nor to guide or monitor treatment for MRSA infections. Performed at [facility], [place] [address] [place], [place] [address]      Labs: BNP (last 3 results) No results for input(s): BNP in the last 8760 hours. Basic Metabolic Panel: Recent  Labs  Lab 04/24/19 2234 04/25/19 1249 04/26/19 0220 05/01/19 0536  NA 141  --  139 137  K 3.3*  --  3.1* 3.9  CL 112*  --  109 104  CO2 17*  --  23 25  GLUCOSE 83  --  90 129*  BUN 9  --  <5* 8  CREATININE 0.88 0.71 0.63 0.77  CALCIUM 8.3*  --  8.4* 8.9  MG  --   --  2.0  --   PHOS  --   --  3.1  --    Liver Function Tests: Recent Labs  Lab 04/24/19 2234 05/01/19 0536  AST 15 13*  ALT 15 14  ALKPHOS 62 68  BILITOT 0.5 0.7  PROT 6.3* 6.1*  ALBUMIN 3.7 3.1*   No results for input(s): LIPASE, AMYLASE in the last 168 hours. No results for input(s): AMMONIA in the last 168 hours. CBC: Recent Labs  Lab 04/24/19 2234 04/25/19  1249 05/01/19 0536  WBC 7.5 8.2 5.9  NEUTROABS 5.7  --   --   HGB 14.2 15.8* 14.0  HCT 44.3 46.5* 40.6  MCV 95.7 90.3 90.6  PLT 274 255 264   Cardiac Enzymes: No results for input(s): CKTOTAL, CKMB, CKMBINDEX, TROPONINI in the last 168 hours. BNP: Invalid input(s): POCBNP CBG: Recent Labs  Lab 04/28/19 0606 04/28/19 0824 04/28/19 1203 05/01/19 0837 05/01/19 1218  GLUCAP 96 86 135* 86 178*   D-Dimer No results for input(s): DDIMER in the last 72 hours. Hgb A1c No results for input(s): HGBA1C in the last 72 hours. Lipid Profile No results for input(s): CHOL, HDL, LDLCALC, TRIG, CHOLHDL, LDLDIRECT in the last 72 hours. Thyroid function studies No results for input(s): TSH, T4TOTAL, T3FREE, THYROIDAB in the last 72 hours.  Invalid input(s): FREET3 Anemia work up No results for input(s): VITAMINB12, FOLATE, FERRITIN, TIBC, IRON, RETICCTPCT in the last 72 hours. Urinalysis    Component Value Date/Time   COLORURINE YELLOW 05/01/2019 1447   APPEARANCEUR CLOUDY (A) 05/01/2019 1447   LABSPEC 1.009 05/01/2019 1447   PHURINE 5.0 05/01/2019 1447   GLUCOSEU NEGATIVE 05/01/2019 1447   HGBUR SMALL (A) 05/01/2019 1447   BILIRUBINUR NEGATIVE 05/01/2019 1447   KETONESUR NEGATIVE 05/01/2019 1447   PROTEINUR NEGATIVE 05/01/2019 1447   UROBILINOGEN 0.2 08/25/2008 1630   NITRITE POSITIVE (A) 05/01/2019 1447   LEUKOCYTESUR LARGE (A) 05/01/2019 1447   Sepsis Labs Invalid input(s): PROCALCITONIN,  WBC,  LACTICIDVEN  Time coordinating discharge: 25 minutes  SIGNED:  Mercy Riding, MD  [facility] 05/01/2019, 3:17 PM  If 7PM-7AM, please contact night-coverage [email or web address] Password [email or web address]
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
## 2019-05-01 NOTE — Progress Notes (Signed)
CSW followed up with [facility] on patient's referral,  they report no current bed avail. They took CSW number down and will call when bed becomes avail.   [place], [place]

## 2019-05-01 NOTE — Progress Notes (Signed)
[place] NOVEL CORONAVIRUS (COVID-19) DAILY CHECK-OFF SYMPTOMS - answer yes or no to each - every day NO YES  Have you had a fever in the past 24 hours?  . Fever (Temp > 37.80C / 100F) X   Have you had any of these symptoms in the past 24 hours? . New Cough .  Sore Throat  .  Shortness of Breath .  Difficulty Breathing .  Unexplained Body Aches   X   Have you had any one of these symptoms in the past 24 hours not related to allergies?   . Runny Nose .  Nasal Congestion .  Sneezing   X   If you have had runny nose, nasal congestion, sneezing in the past 24 hours, has it worsened?  X   EXPOSURES - check yes or no X   Have you traveled outside the state in the past 14 days?  X   Have you been in contact with someone with a confirmed diagnosis of COVID-19 or PUI in the past 14 days without wearing appropriate PPE?  X   Have you been living in the same home as a person with confirmed diagnosis of COVID-19 or a PUI (household contact)?    X   Have you been diagnosed with COVID-19?    X              What to do next: Answered NO to all: Answered YES to anything:   Proceed with unit schedule Follow the [facility] Inpatient Flowsheet.   

## 2019-05-01 NOTE — Progress Notes (Signed)
I received a phone call from Dr. Cyndia Skeeters and he reported that the patient showed indication of a UTI based on her urinalysis.  Dr. Cyndia Skeeters has suggested starting the patient on Keflex 500 mg 3 times daily for 7 days.  I confirmed with patient and she stated that she is pretty sure that she has taken Keflex in the past and she does not have any reactions to it.  I will prescribe the patient Keflex 500 mg p.o. 3 times daily for 7 days.

## 2019-05-01 NOTE — Progress Notes (Signed)
Patient will DC to:[facility] Anticipated DC date: 05/01/2019 Family notified:Joseph Transport by: Event organiser  Per MD patient ready for DC to [facility] . RN, patient, patient's family, and facility notified of DC. Discharge Summary and IVC sent to facility. RN given number for report [phone] patient will go to 400 bed 1, accepting physician is Dr. Mariea Clonts, Attending Psychiatrist is Myles Lipps. DC packet on chart. Ambulance transport requested for patient.  CSW signing off.  [place], [place]

## 2019-05-01 NOTE — Progress Notes (Signed)
1:1 Nursing Note   D.  Pt has been pleasant, has stayed mostly in room.  Pt denies complaints but states she hopes she does not get a roommate.  Pt's father brought her phone and iPad in and patient was able to get necessary phone numbers.  Pt denies SI/HI/AVH at this time.  A.  Support and encouragement offered, medications given as ordered.  R.  Pt remains safe on the unit, 1:1 monitoring continued as ordered for patient safety.

## 2019-05-01 NOTE — Progress Notes (Addendum)
Called and gave report to receiving facility. Pt not in distress and tolerated well. Pt has been pleasant this afternoon, asleep most of the morning. Discharge paperwork given to police for [facility].

## 2019-05-01 NOTE — Tx Team (Signed)
Initial Treatment Plan 05/01/2019 6:40 PM Sara Barnett FMM:[id number]  1  PATIENT STRESSORS: Educational concerns Financial difficulties Health problems Loss of vision   PATIENT STRENGTHS: Ability for insight Active sense of humor Average or above average intelligence Capable of independent living Communication skills General fund of knowledge Motivation for treatment/growth   PATIENT IDENTIFIED PROBLEMS: MDD with SI  Legally Blind  UTI          " I took the Benadryl so I would be comfortable"  " They're all crazy"     DISCHARGE CRITERIA:  Ability to meet basic life and health needs Adequate post-discharge living arrangements Improved stabilization in mood, thinking, and/or behavior Medical problems require only outpatient monitoring  PRELIMINARY DISCHARGE PLAN: Attend aftercare/continuing care group Attend 12-step recovery group Outpatient therapy  PATIENT/FAMILY INVOLVEMENT: This treatment plan has been presented to and reviewed with the patient, Sara Barnett, and/or family member,  The patient and family have been given the opportunity to ask questions and make suggestions.  Lauralyn Primes, RN 05/01/2019, 6:40 PM

## 2019-05-01 NOTE — Plan of Care (Signed)
  Problem: Coping: Goal: Ability to interact with others will improve Outcome: Progressing Goal: Demonstration of participation in decision-making regarding own care will improve Outcome: Progressing

## 2019-05-02 DIAGNOSIS — F322 Major depressive disorder, single episode, severe without psychotic features: Principal | ICD-10-CM

## 2019-05-02 MED ORDER — BUPROPION HCL ER (XL) 300 MG PO TB24
300.0000 mg | ORAL_TABLET | Freq: Every day | ORAL | Status: DC
  Administered 2019-05-03 – 2019-05-05 (×3): 300 mg via ORAL
  Filled 2019-05-02 (×5): qty 1

## 2019-05-02 MED ORDER — BUPROPION HCL ER (XL) 150 MG PO TB24
ORAL_TABLET | ORAL | Status: AC
  Filled 2019-05-02: qty 1

## 2019-05-02 MED ORDER — BUPROPION HCL ER (XL) 150 MG PO TB24
150.0000 mg | ORAL_TABLET | Freq: Every day | ORAL | Status: DC
  Administered 2019-05-02: 150 mg via ORAL
  Filled 2019-05-02 (×3): qty 1

## 2019-05-02 NOTE — [facility] Group Notes (Signed)
[place] LCSW Group Therapy Note  05/02/2019   10:00-11:00AM  Type of Therapy and Topic:  Group Therapy:  Unhealthy versus Healthy Supports, Which Am I?  Participation Level:  Minimal   Description of Group:  Patients in this group were introduced to the concept that additional supports including self-support are an essential part of recovery.  Initially a discussion was held about the differences between healthy versus unhealthy supports.  Patients were asked to share what healthy supports are helping them to achieve their goals and what unhealthy supports in their lives need to be addressed.   A song entitled "My Own Hero" was played and a group discussion ensued in which patients stated they could relate to the song and it inspired them to realize they have be willing to help themselves in order to succeed, because other people cannot achieve sobriety or stability for them.  We discussed adding a variety of healthy supports to address the various needs in patient lives, including becoming more self-supportive.  A song was then played called "I Am Enough" and people stated they related to this when it talked about being your own worst enemy.  A song was played called "I Know Where I've Been" toward the end of group and used to conduct an inspirational wrap-up to group of remembering how far they have already come in their journey.  Therapeutic Goals: 1)  Highlight the differences between healthy and unhealthy supports 2)  Suggest the importance of being a part of one's own support system  3)  Identify the patient's current support system   4) Elicit commitments to add healthy supports and to become more conscious of being self-supportive   Summary of Patient Progress:  The patient listed as healthy supports the following:  One doctor.  The patient expressed that unhealthy supports to be addressed include "everybody else."  Patient feels she is sometimes a healthy and sometimes an unhealthy support for  herself.  Healthy supports which could be added for increased stability and happiness are not currently under consideration, because she is angry at being in the hospital.  Therapeutic Modalities:   [place] , MSW, LCSW
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
## 2019-05-02 NOTE — Progress Notes (Signed)
D.  Pt pleasant on approach, no complaints voiced.  Pt has remained in room thus far this shift with 1:1 sitter.  Pt denies SI/HI/AVH at this time.  A   Support and encouragement offered, medication given as ordered.  1:1 continued as ordered for patient safety.  R.  Pt remains safe on the unit.

## 2019-05-02 NOTE — Progress Notes (Signed)
1:1 Nursing Note  D.  Pt has been awake looking at iPad so far this shift.  States she can not sleep due to her UTI.  Pt as given both Trazodone 50 mg and Klonopin 0.5 mg as ordered at HS.  Pt presently laying in bed awake, no distress noted  Pt also states 1:1 bothers her.  Pt states she "can see better than most people think I can", and that she lives alone.  A.  1:1 continued as ordered for Pt safety.   R.  Pt re mains safe on the unit, will continue to monitor.

## 2019-05-02 NOTE — [facility] Group Notes (Signed)
[facility] Group Notes: Nursing Education Group  Date:  05/02/2019  Time:  1:15 PM  Facilitator: Patty D., RN  Type of Therapy:  Nurse Education  Participation Level:  Active  Participation Quality:  Appropriate  Affect:  Appropriate  Cognitive:  Alert and Oriented  Insight:  Appropriate  Engagement in Group:  Developing/Improving  Modes of Intervention:  Activity, Discussion, Socialization and Support  Summary of Progress/Problems: Patient attended group and was not disruptive in the discussion.   Keigen Caddell A Amiri Riechers 05/02/2019, 1:15 PM 

## 2019-05-02 NOTE — Progress Notes (Signed)
[place] NOVEL CORONAVIRUS (COVID-19) DAILY CHECK-OFF SYMPTOMS - answer yes or no to each - every day NO YES  Have you had a fever in the past 24 hours?  . Fever (Temp > 37.80C / 100F) X   Have you had any of these symptoms in the past 24 hours? . New Cough .  Sore Throat  .  Shortness of Breath .  Difficulty Breathing .  Unexplained Body Aches   X   Have you had any one of these symptoms in the past 24 hours not related to allergies?   . Runny Nose .  Nasal Congestion .  Sneezing   X   If you have had runny nose, nasal congestion, sneezing in the past 24 hours, has it worsened?  X   EXPOSURES - check yes or no X   Have you traveled outside the state in the past 14 days?  X   Have you been in contact with someone with a confirmed diagnosis of COVID-19 or PUI in the past 14 days without wearing appropriate PPE?  X   Have you been living in the same home as a person with confirmed diagnosis of COVID-19 or a PUI (household contact)?    X   Have you been diagnosed with COVID-19?    X              What to do next: Answered NO to all: Answered YES to anything:   Proceed with unit schedule Follow the [facility] Inpatient Flowsheet.   

## 2019-05-02 NOTE — Progress Notes (Signed)
1:1 Nursing Note  D.  Pt did appear to get 3.25 hours of sleep last night, no distress noted.  A.  1:1 continued as ordered for patient safety  R. Pt remains safe on the unit
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
## 2019-05-02 NOTE — Progress Notes (Signed)
1:1 D pt is observed lying on her side, with bedcovers pulled over her. Her eyes are closed, she is calm and relaxed, her body is stilla nd she is asleep.     A Writer awakened her up and instructed her it was time to attend her group and she got OOB and went to the dayroom. She is able to ambulate independently, she wears her shoes, she says she was " up and awake all night ..because of that UTI". She is given a pitcher of water by this Probation officer and she agrees that she will drink it up.     R Safety in place. And 1:1 cont.

## 2019-05-03 LAB — URINE CULTURE: Culture: >=100000 — AB

## 2019-05-03 MED ORDER — TRAZODONE HCL 150 MG PO TABS
75.0000 mg | ORAL_TABLET | Freq: Every evening | ORAL | Status: DC | PRN
  Administered 2019-05-03: 75 mg via ORAL
  Filled 2019-05-03: qty 1

## 2019-05-03 MED ORDER — LINACLOTIDE 145 MCG PO CAPS
145.0000 ug | ORAL_CAPSULE | Freq: Every day | ORAL | Status: DC
  Administered 2019-05-04 – 2019-05-05 (×2): 145 ug via ORAL
  Filled 2019-05-03 (×4): qty 1

## 2019-05-03 NOTE — Progress Notes (Signed)
Patient ID: Sara Barnett, female   DOB: 1966/05/21, 53 y.o.   MRN: [id number] Pt resting quietly with eyes closed. Breathing even and unlabored. No distress noted. Level 1 obs for safety. Support provided. Sitter at bedtime. Safety maintained.

## 2019-05-03 NOTE — Progress Notes (Addendum)
[facility] MD Progress Note  05/03/2019 12:32 PM Sara Barnett  MRN:  [id number] Subjective:  "I'm sleepy."  Ms. Cohea seen while eating a snack in her room. She is observed ambulating well without assistance. She states she is familiar with her surroundings now and denies need for 1:1 monitoring (previously on 1:1 due to being legally blind). She reports her mood is "ok." Appears mildly irritable. She complains of still feeling sleepy after receiving 100 mg trazodone last night. She denies current SI but is ambivalent when asked if she is thankful to have survived the overdose- initially states yes but then states "being in the hospital makes me wish I had died. I'm not happy." She requests discharge but is unable to say how she will keep herself safe if she returns home. She states that she sat and contemplated her overdose before the attempt, and she drank alcohol with the Klonopin and amlodipine due to her doctor having told her this could be lethal. Her friend called while she was overdosing, and the friend called EMS due to the patient's slurred speech. She reports her Wellbutrin dose was increased shortly before the overdose last week. Other potential antidepressants are discussed but patient continues to be resistant due to history of weight gain on other antidepressants. She has been frustrated with her two adult daughters who have been living with her, due to their failure to help with housekeeping. She had an argument with her daughter in which the daughter told her she was a bad mother, after which she overdosed due to feeling worthless. She has not spoken with the daughters since admission, but they are at her parents' home and will be staying there after patient discharges home. She believes the daughters being away will help with her stress levels. She is requesting to restart her Linzess from home- states her last BM was three days ago. Denies N/V. Denies medication side effects. UTI symptoms  have improved on Keflex.  From admission H&P: Patient is a 53 year old female with a past psychiatric history significant for major depression who originally presented to the [facility] emergency department on 6/28 after an intentional overdose of multiple medications. She had apparently overdosed on Benadryl, amlodipine, Klonopin and alcohol.She required intubation in the field. While she was in [facility] she required Levophed for hypotension.   Principal Problem: <principal problem not specified> Diagnosis: Active Problems:   Major depressive disorder, severe ([facility])  Total Time spent with patient: 30 minutes  Past Psychiatric History: See admission H&P  Past Medical History:  Past Medical History:  Diagnosis Date  . Abnormally small mouth   . Anxiety   . Arthritis    knees  . Chronic ethmoidal sinusitis 07/2017  . Chronic maxillary sinusitis 07/2017  . Complication of anesthesia    anaphylaxis after Methylprednisolone injection - cardiac arrest/PEA  . 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 Constipation   . Depression   . Deviated nasal septum 07/2017  . GERD (gastroesophageal reflux disease)   . History of cardiac murmur    states no known problems, no cardiologist  . Hypertension    states under control with meds., has been on med. x 17 years  . Legally blind   . Nasal turbinate hypertrophy 07/2017  . Rash of back 08/18/2017  . Restless leg     Past Surgical History:  Procedure Laterality Date  . ABLATION ON ENDOMETRIOSIS    . BUNIONECTOMY Right   . CATARACT EXTRACTION, BILATERAL  age 61 year  . CESAREAN SECTION     x 2  . COLONOSCOPY WITH PROPOFOL N/A 08/25/2018   Procedure: COLONOSCOPY WITH PROPOFOL;  Surgeon: Danie Binder, MD;  [facility];  Service: Endoscopy;  Laterality: N/A;  2:15pm  . ENDOSCOPIC CONCHA BULLOSA RESECTION Bilateral 08/25/2017   Procedure: BILATERAL ENDOSCOPIC CONCHA BULLOSA RESECTION;  Surgeon: Leta Baptist, MD;   Location: [place];  Service: ENT;  Laterality: Bilateral;  . ETHMOIDECTOMY Bilateral 08/25/2017   Procedure: BILATERAL TOTAL ETHMOIDECTOMY;  Surgeon: Leta Baptist, MD;  Location: [place];  Service: ENT;  Laterality: Bilateral;  . EYE SURGERY    . INTRAOCULAR LENS INSERTION Bilateral    age 39s  . LUMBAR FUSION  07/05/2004  . LUMBAR LAMINECTOMY  07/05/2004   L5-S1  . MAXILLARY ANTROSTOMY Bilateral 08/25/2017   Procedure: BILATERAL MAXILLARY ANTROSTOMY WITH TISSUE REMOVAL;  Surgeon: Leta Baptist, MD;  Location: [place];  Service: ENT;  Laterality: Bilateral;  . MEMBRANE PEEL Right 11/06/2011; 08/06/2013  . NASAL SEPTOPLASTY W/ TURBINOPLASTY Bilateral 08/25/2017   Procedure: NASAL SEPTOPLASTY WITH BILATERAL TURBINATE REDUCTION;  Surgeon: Leta Baptist, MD;  Location: [place];  Service: ENT;  Laterality: Bilateral;  . PARS PLANA VITRECTOMY Right 03/22/2011; 08/21/2011; 12/20/2011; 05/13/2012; 11/20/2012; 05/12/2013; 08/06/2013; 05/17/2016  . PARS PLANA VITRECTOMY W/ SCLERAL BUCKLE Left 04/21/2015  . PLANTAR FASCIA RELEASE Right   . SINUS ENDO W/FUSION Bilateral 08/25/2017   Procedure: ENDOSCOPIC SINUS SURGERY WITH FUSION NAVIGATION;  Surgeon: Leta Baptist, MD;  Location: [place];  Service: ENT;  Laterality: Bilateral;  . TUBAL LIGATION     Family History:  Family History  Problem Relation Age of Onset  . Depression Mother   . Cancer Father        prostate  . Hypertension Father   . Hearing loss Father   . Other Father        dialysis  . Vision loss Daughter        cataracts  . Stroke Maternal Grandmother   . Other Paternal Grandmother        lung issues  . Vision loss Daughter        cataracts  . Colon cancer Neg Hx   . Colon polyps Neg Hx    Family Psychiatric  History: See admission H&P Social History:  Social History   Substance and Sexual Activity  Alcohol Use Yes   Comment: draNK A BOTRTLE OF WINE  04/25/2019     Social History   Substance and Sexual Activity  Drug Use No    Social History   Socioeconomic History  . Marital status: Divorced    Spouse name: Not on file  . Number of children: Not on file  . Years of education: Not on file  . Highest education level: Not on file  Occupational History  . Not on file  Social Needs  . Financial resource strain: Very hard  . Food insecurity    Worry: Often true    Inability: Often true  . Transportation needs    Medical: Yes    Non-medical: Yes  Tobacco Use  . Smoking status: Former Smoker    Packs/day: 3.00    Years: 15.00    Pack years: 45.00    Types: Cigarettes    Quit date: 05/24/2009    Years since quitting: 9.9  . Smokeless tobacco: Never Used  Substance and Sexual Activity  . Alcohol use: Yes  Comment: draNK A BOTRTLE OF WINE 04/25/2019  . Drug use: No  . Sexual activity: Not Currently    Birth control/protection: None, Surgical    Comment: tubal and ablation  Lifestyle  . Physical activity    Days per week: 0 days    Minutes per session: 0 min  . Stress: Very much  Relationships  . Social Herbalist on phone: Never    Gets together: Once a week    Attends religious service: Never    Active member of club or organization: No    Attends meetings of clubs or organizations: Not on file    Relationship status: Divorced  Other Topics Concern  . Not on file  Social History Narrative  . Not on file   Additional Social History:    Pain Medications: KLONOPIN Withdrawal Symptoms: Nausea / Vomiting, Irritability                    Sleep: Good  Appetite:  Good  Current Medications: Current Facility-Administered Medications  Medication Dose Route Frequency Provider Last Rate Last Dose  . alum & mag hydroxide-simeth (MAALOX/MYLANTA) 200-200-20 MG/5ML suspension 30 mL  30 mL Oral Q4H PRN Money, Darnelle Maffucci B, FNP      . buPROPion (WELLBUTRIN XL) 24 hr tablet 300 mg  300 mg Oral Daily Sharma Covert, MD   300 mg at 05/03/19 1100  . cephALEXin (KEFLEX) capsule 500 mg  500 mg Oral TID Money, Lowry Ram, FNP   500 mg at 05/03/19 1100  . clonazePAM (KLONOPIN) tablet 0.5 mg  0.5 mg Oral BID PRN Money, Lowry Ram, FNP   0.5 mg at 05/02/19 2130  . [START ON 05/04/2019] linaclotide (LINZESS) capsule 145 mcg  145 mcg Oral QAC breakfast Harriett Sine E, NP      . magnesium hydroxide (MILK OF MAGNESIA) suspension 30 mL  30 mL Oral Daily PRN Money, Lowry Ram, FNP      . pantoprazole (PROTONIX) EC tablet 40 mg  40 mg Oral Daily Money, Travis B, FNP   40 mg at 05/03/19 1100  . traZODone (DESYREL) tablet 100 mg  100 mg Oral QHS PRN Money, Lowry Ram, FNP   100 mg at 05/02/19 2130    Lab Results:  Results for orders placed or performed during the hospital encounter of 04/24/19 (from the past 48 hour(s))  Urinalysis, Routine w reflex microscopic     Status: Abnormal   Collection Time: 05/01/19  2:47 PM  Result Value Ref Range   Color, Urine YELLOW YELLOW   APPearance CLOUDY (A) CLEAR   Specific Gravity, Urine 1.009 1.005 - 1.030   pH 5.0 5.0 - 8.0   Glucose, UA NEGATIVE NEGATIVE mg/dL   Hgb urine dipstick SMALL (A) NEGATIVE   Bilirubin Urine NEGATIVE NEGATIVE   Ketones, ur NEGATIVE NEGATIVE mg/dL   Protein, ur NEGATIVE NEGATIVE mg/dL   Nitrite POSITIVE (A) NEGATIVE   Leukocytes,Ua LARGE (A) NEGATIVE   RBC / HPF 11-20 0 - 5 RBC/hpf   WBC, UA >50 (H) 0 - 5 WBC/hpf   Bacteria, UA MANY (A) NONE SEEN   Squamous Epithelial / LPF 0-5 0 - 5   WBC Clumps PRESENT    Mucus PRESENT    Non Squamous Epithelial 0-5 (A) NONE SEEN    Comment: Performed at [facility], [address], [place],  [address]    Blood Alcohol level:  Lab Results  Component Value Date  ETH 54 (H) 04/24/2019   ETH 66 (H) 11/05/3233    Metabolic Disorder Labs: No results found for: HGBA1C, MPG No results found for: PROLACTIN No results found for: CHOL, TRIG, HDL, CHOLHDL, VLDL, LDLCALC  Physical  Findings: AIMS: Facial and Oral Movements Muscles of Facial Expression: None, normal Lips and Perioral Area: None, normal Jaw: None, normal Tongue: None, normal,Extremity Movements Upper (arms, wrists, hands, fingers): None, normal Lower (legs, knees, ankles, toes): None, normal, Trunk Movements Neck, shoulders, hips: None, normal, Overall Severity Severity of abnormal movements (highest score from questions above): None, normal Incapacitation due to abnormal movements: None, normal Patient's awareness of abnormal movements (rate only patient's report): No Awareness, Dental Status Current problems with teeth and/or dentures?: No Does patient usually wear dentures?: No  CIWA:  CIWA-Ar Total: 3 COWS:  COWS Total Score: 2  Musculoskeletal: Strength & Muscle Tone: within normal limits Gait & Station: normal Patient leans: N/A  Psychiatric Specialty Exam: Physical Exam  Nursing note and vitals reviewed. Constitutional: She is oriented to person, place, and time. She appears well-developed and well-nourished.  Cardiovascular: Normal rate.  Respiratory: Effort normal.  Neurological: She is alert and oriented to person, place, and time.    Review of Systems  Constitutional: Negative.   Respiratory: Negative for cough.   Cardiovascular: Negative for chest pain.  Gastrointestinal: Positive for constipation. Negative for abdominal pain, diarrhea, nausea and vomiting.  Genitourinary: Negative for dysuria, frequency and urgency.  Neurological: Negative for dizziness and headaches.  Psychiatric/Behavioral: Positive for depression. Negative for hallucinations and suicidal ideas. The patient is not nervous/anxious and does not have insomnia.     Blood pressure 117/78, pulse 70, temperature 98.4 F (36.9 C), temperature source Oral, resp. rate 15, height 5' 2.5" (1.588 m), weight 75.8 kg, SpO2 100 %.Body mass index is 30.06 kg/m.  General Appearance: Fairly Groomed  Eye Contact:  Fair   Speech:  Normal Rate  Volume:  Normal  Mood:  Irritable  Affect:  Constricted  Thought Process:  Coherent and Descriptions of Associations: Circumstantial  Orientation:  Full (Time, Place, and Person)  Thought Content:  Logical  Suicidal Thoughts:  Denies  Homicidal Thoughts:  Denies  Memory:  Immediate;   Fair Recent;   Fair  Judgement:  Fair  Insight:  Fair  Psychomotor Activity:  Normal  Concentration:  Concentration: Fair and Attention Span: Fair  Recall:  [employer] of Knowledge:  Fair  Language:  Good  Akathisia:  No  Handed:  Right  AIMS (if indicated):     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  Start Linzess 145 mcg PO daily for IBS Decrease trazodone to 75 mg PO QHS PRN insomnia Continue Wellbutrin XL 300 mg PO daily for depression Continue Klonopin 0.5 mg PO BID PRN anxiety Continue Keflex 500 mg PO TID for UTI Continue Protonix 40 mg PO daily for GERD  Patient will participate in the therapeutic group milieu.  Discharge disposition in progress.   Connye Burkitt, NP 05/03/2019, 12:32 PM

## 2019-05-03 NOTE — Progress Notes (Signed)
1:1 Nursing Notes  D.  Pt has been resting with eyes closed, respirations even and unlabored.  No distress noted.  A.  1:1 continued as ordered for patient safety  R.  Pt remains safe on the unit

## 2019-05-03 NOTE — Progress Notes (Signed)
Patient ID: Sara Barnett, female   DOB: [birth year], 53 y.o.   MRN: [id number] D) Pt has been superficial and minimizing. focused on going home and "getting therapy from home". Pt rates her depression a 5/10 today and anxiety 4/10 and hopelessness a 2/10. Pt insight and judgement limited. Contracts for safety. A) Level 3 obs maintained for safety. Med ed reinforced. Encourage participation and work on tx while here and support available. R) Superficial.

## 2019-05-03 NOTE — Progress Notes (Signed)
Patient ID: Sara Barnett, female   DOB: 08/12/1966, 53 y.o.   MRN: [id number] Pt Level 1 obs d/c per MD. Pt is able to ambulate without assistance. Pt verbalizes understanding of safety precautions.

## 2019-05-03 NOTE — Tx Team (Signed)
Interdisciplinary Treatment and Diagnostic Plan Update  05/03/2019 Time of Session:  Sara Barnett MRN: [id number]  Principal Diagnosis: <principal problem not specified>  Secondary Diagnoses: Active Problems:   Major depressive disorder, severe (HCC)   Current Medications:  Current Facility-Administered Medications  Medication Dose Route Frequency Provider Last Rate Last Dose  . alum & mag hydroxide-simeth (MAALOX/MYLANTA) 200-200-20 MG/5ML suspension 30 mL  30 mL Oral Q4H PRN Money, Darnelle Maffucci B, FNP      . buPROPion (WELLBUTRIN XL) 24 hr tablet 300 mg  300 mg Oral Daily Sharma Covert, MD      . cephALEXin [facility]) capsule 500 mg  500 mg Oral TID Money, Lowry Ram, FNP   500 mg at 05/02/19 1759  . clonazePAM (KLONOPIN) tablet 0.5 mg  0.5 mg Oral BID PRN Money, Lowry Ram, FNP   0.5 mg at 05/02/19 2130  . magnesium hydroxide (MILK OF MAGNESIA) suspension 30 mL  30 mL Oral Daily PRN Money, Lowry Ram, FNP      . pantoprazole (PROTONIX) EC tablet 40 mg  40 mg Oral Daily Money, Lowry Ram, FNP   40 mg at 05/02/19 0856  . traZODone (DESYREL) tablet 100 mg  100 mg Oral QHS PRN Money, Lowry Ram, FNP   100 mg at 05/02/19 2130   PTA Medications: Medications Prior to Admission  Medication Sig Dispense Refill Last Dose  . OVER THE COUNTER MEDICATION Take 2 tablets by mouth daily. Hair Growth Supplement   05/01/2019 at Unknown time  . prednisoLONE acetate (PRED FORTE) 1 % ophthalmic suspension Place 1 drop into the right eye 2 (two) times a day.   05/01/2019 at Unknown time  . cephALEXin (KEFLEX) 500 MG capsule Take 1 capsule (500 mg total) by mouth 3 (three) times daily for 7 days. 21 capsule 0 More than a month at Unknown time  . Cholecalciferol (VITAMIN D3) 2000 units TABS Take 2,000 Units by mouth daily.   More than a month at Unknown time  . Ferrous Gluconate (IRON) 240 (27 Fe) MG TABS Take 240 mg by mouth daily.   More than a month at Unknown time  . fexofenadine-pseudoephedrine (ALLEGRA-D) 60-120  MG 12 hr tablet Take 1 tablet by mouth daily as needed (for allergies).   More than a month at Unknown time  . fluticasone (FLONASE) 50 MCG/ACT nasal spray Place 2 sprays into both nostrils daily as needed for allergies.   3 More than a month at Unknown time  . guaiFENesin (MUCINEX) 600 MG 12 hr tablet Take 600 mg by mouth daily as needed for cough.    More than a month at Unknown time  . lansoprazole (PREVACID) 30 MG capsule Take 30 mg by mouth daily.    More than a month at Unknown time  . linaclotide (LINZESS) 145 MCG CAPS capsule Take 1 capsule (145 mcg total) by mouth daily before breakfast. 90 capsule 3 More than a month at Unknown time  . Multiple Vitamin (MULTIVITAMIN) tablet Take 1 tablet by mouth daily.   More than a month at Unknown time  . Omega-3 Fatty Acids (SALMON OIL-1000 PO) Take 2,000 mg by mouth daily.   More than a month at Unknown time  . vitamin C (ASCORBIC ACID) 500 MG tablet Take 500 mg by mouth daily.   More than a month at Unknown time    Patient Stressors: Educational concerns Financial difficulties Health problems Loss of vision  Patient Strengths: Ability for insight Active sense of humor Average or above  average intelligence Capable of independent living Communication skills General fund of knowledge Motivation for treatment/growth  Treatment Modalities: Medication Management, Group therapy, Case management,  1 to 1 session with clinician, Psychoeducation, Recreational therapy.   Physician Treatment Plan for Primary Diagnosis: <principal problem not specified> Long Term Goal(s): Improvement in symptoms so as ready for discharge Improvement in symptoms so as ready for discharge   Short Term Goals: Ability to identify changes in lifestyle to reduce recurrence of condition will improve Ability to verbalize feelings will improve Ability to disclose and discuss suicidal ideas Ability to demonstrate self-control will improve Ability to identify and develop  effective coping behaviors will improve Ability to maintain clinical measurements within normal limits will improve Ability to identify changes in lifestyle to reduce recurrence of condition will improve Ability to verbalize feelings will improve Ability to disclose and discuss suicidal ideas Ability to demonstrate self-control will improve Ability to identify and develop effective coping behaviors will improve Ability to maintain clinical measurements within normal limits will improve  Medication Management: Evaluate patient's response, side effects, and tolerance of medication regimen.  Therapeutic Interventions: 1 to 1 sessions, Unit Group sessions and Medication administration.  Evaluation of Outcomes: Not Met  Physician Treatment Plan for Secondary Diagnosis: Active Problems:   Major depressive disorder, severe ([place])  Long Term Goal(s): Improvement in symptoms so as ready for discharge Improvement in symptoms so as ready for discharge   Short Term Goals: Ability to identify changes in lifestyle to reduce recurrence of condition will improve Ability to verbalize feelings will improve Ability to disclose and discuss suicidal ideas Ability to demonstrate self-control will improve Ability to identify and develop effective coping behaviors will improve Ability to maintain clinical measurements within normal limits will improve Ability to identify changes in lifestyle to reduce recurrence of condition will improve Ability to verbalize feelings will improve Ability to disclose and discuss suicidal ideas Ability to demonstrate self-control will improve Ability to identify and develop effective coping behaviors will improve Ability to maintain clinical measurements within normal limits will improve     Medication Management: Evaluate patient's response, side effects, and tolerance of medication regimen.  Therapeutic Interventions: 1 to 1 sessions, Unit Group sessions and Medication  administration.  Evaluation of Outcomes: Not Met   RN Treatment Plan for Primary Diagnosis: <principal problem not specified> Long Term Goal(s): Knowledge of disease and therapeutic regimen to maintain health will improve  Short Term Goals: Ability to participate in decision making will improve, Ability to verbalize feelings will improve, Ability to disclose and discuss suicidal ideas and Ability to identify and develop effective coping behaviors will improve  Medication Management: RN will administer medications as ordered by provider, will assess and evaluate patient's response and provide education to patient for prescribed medication. RN will report any adverse and/or side effects to prescribing provider.  Therapeutic Interventions: 1 on 1 counseling sessions, Psychoeducation, Medication administration, Evaluate responses to treatment, Monitor vital signs and CBGs as ordered, Perform/monitor CIWA, COWS, AIMS and Fall Risk screenings as ordered, Perform wound care treatments as ordered.  Evaluation of Outcomes: Not Met   LCSW Treatment Plan for Primary Diagnosis: <principal problem not specified> Long Term Goal(s): Safe transition to appropriate next level of care at discharge, Engage patient in therapeutic group addressing interpersonal concerns.  Short Term Goals: Engage patient in aftercare planning with referrals and resources  Therapeutic Interventions: Assess for all discharge needs, 1 to 1 time with Social worker, Explore available resources and support systems, Assess for adequacy  in community support network, Educate family and significant other(s) on suicide prevention, Complete Psychosocial Assessment, Interpersonal group therapy.  Evaluation of Outcomes: Not Met   Progress in Treatment: Attending groups: Yes. Participating in groups: Yes. Taking medication as prescribed: Yes. Toleration medication: Yes. Family/Significant other contact made: No, will contact:  if  patient consents to collateral contacts Patient understands diagnosis: Yes. Discussing patient identified problems/goals with staff: Yes. Medical problems stabilized or resolved: Yes. Denies suicidal/homicidal ideation: Yes. Issues/concerns per patient self-inventory: No. Other:   New problem(s) identified: None   New Short Term/Long Term Goal(s):Detox, medication stabilization, elimination of SI thoughts, development of comprehensive mental wellness plan.    Patient Goals:    Discharge Plan or Barriers: Patient recently admitted. CSW will continue to follow and assess for appropriate referrals and possible discharge planning.    Reason for Continuation of Hospitalization: Anxiety Depression Medication stabilization Suicidal ideation  Estimated Length of Stay: 3-5 days   Attendees: Patient: 05/03/2019 8:39 AM  Physician: Dr. Jethro Bastos, MD 05/03/2019 8:39 AM  Nursing: Selinda Eon [place]  05/03/2019 8:39 AM  RN Care Manager: 05/03/2019 8:39 AM  Social Worker: Radonna Ricker , [place] 05/03/2019 8:39 AM  Recreational Therapist:  05/03/2019 8:39 AM  Other:  05/03/2019 8:39 AM  Other:  05/03/2019 8:39 AM  Other: 05/03/2019 8:39 AM    Scribe for Treatment Team: Marylee Floras, [place] 05/03/2019 8:39 AM

## 2019-05-03 NOTE — Progress Notes (Signed)
1:1 Nursing Note  D.  Pt has slept much better this shift than previous.  No distress noted.  A.  1:1 continued as ordered for patient safety  R.  Pt remains safe on the unit

## 2019-05-04 MED ORDER — IBUPROFEN 600 MG PO TABS
600.0000 mg | ORAL_TABLET | Freq: Four times a day (QID) | ORAL | Status: DC | PRN
  Administered 2019-05-05: 600 mg via ORAL
  Filled 2019-05-04: qty 1

## 2019-05-04 MED ORDER — TRAZODONE HCL 150 MG PO TABS
75.0000 mg | ORAL_TABLET | Freq: Every evening | ORAL | Status: DC | PRN
  Administered 2019-05-04: 75 mg via ORAL
  Filled 2019-05-04: qty 1

## 2019-05-04 NOTE — Progress Notes (Signed)
Patient shared with the group that her positive event for the day was realizing that she is not alone in dealing with issues with her parents. Her goal for tomorrow is to speak with the physician regarding discharge.

## 2019-05-04 NOTE — Progress Notes (Signed)
D:  Patient's self inventory sheet, patient sleeps good, sleep medication helpful.  Good appetite, normal energy level, good concentration, anxiety 5.  Denied withdrawals.  Denied SI.  Problems, L knee pain, legally blind.  Physical pain, L knee pain, Goal is discharge.  Plans to follow rules.  Working on discharge plan.  Please let me go home.  Discharge soon. A:  Medications administered per MD orders.  Emotional support and encouragement given patient. R:  Denied SI and HI, contracts for safety.  Denied A/V hallucinations.  Safety maintained with 15 minute checks.

## 2019-05-04 NOTE — [facility] Suicide Risk Assessment (Signed)
[place] INPATIENT:  Family/Significant Other Suicide Prevention Education  Suicide Prevention Education:  Patient Refusal for Family/Significant Other Suicide Prevention Education: The patient Sara Barnett has refused to provide written consent for family/significant other to be provided Family/Significant Other Suicide Prevention Education during admission and/or prior to discharge.  Physician notified.  Joanne Chars, LCSW 05/04/2019, 11:07 AM

## 2019-05-04 NOTE — Plan of Care (Signed)
Nurse discussed anxiety, depression and coping skills with patient.  

## 2019-05-04 NOTE — Progress Notes (Signed)
D: Pt denies SI/HI/AVH. Pt is pleasant and cooperative. Pt stated she was having issue with trusting and finding a person to be with.  A: Pt was offered support and encouragement. Pt was given scheduled medications. Pt was encourage to attend groups. Q 15 minute checks were done for safety.  R: safety maintained on unit.

## 2019-05-04 NOTE — Progress Notes (Signed)
Spiritual care group on grief and loss facilitated by chaplain Dannon Nguyenthi  Group Goal:  Support / Education around grief and loss Members engage in facilitated group support and psycho-social education.  Group Description:  Following introductions and group rules, group members engaged in facilitated group dialog and support around topic of loss, with particular support around experiences of loss in their lives. Group Identified types of loss (relationships / self / things) and identified patterns, circumstances, and changes that precipitate losses. Reflected on thoughts / feelings around loss, normalized grief responses, and recognized variety in grief experience. Patient Progress:  
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
## 2019-05-04 NOTE — Progress Notes (Signed)
Did not attend group 

## 2019-05-04 NOTE — Progress Notes (Addendum)
[facility] MD Progress Note  05/04/2019 5:01 PM LETICIA COLETTA  MRN:  [id number] Subjective:   Reports some improvement, and states " I am glad I was saved and did not die".   States " I think I just need to start focusing on myself rather than thinking of everyone else except for me". Denies current medication side effects.    Objective : I have reviewed chart notes and have met with patient.  53 year old female, history of depression, presented to [place] on 6/28 following overdose on Benadryl, Amolodipine, Klonopin, Alcohol. Overdose was severe and required intubation and medical management for stabilization. She was transferred to inpatient psychiatric unit on 7/4 .  She reports she has been feeling better, and as above, currently states she is glad that suicide attempt did not succeed . Denies current suicidal ideations and is currently future oriented, hoping for discharge soon. States " I miss being outdoors, I like walking around [place] track [place] home or in the trails". Tolerating medications well .  On unit is pleasant and polite on approach. No disruptive or agitated behaviors. Limited group /milieu participation.     Principal Problem:  MDD Diagnosis: Active Problems:   Major depressive disorder, severe (HCC)  Total Time spent with patient: 20 minutes  Past Psychiatric History: See admission H&P  Past Medical History:  Past Medical History:  Diagnosis Date  . Abnormally small mouth   . Anxiety   . Arthritis    knees  . Chronic ethmoidal sinusitis 07/2017  . Chronic maxillary sinusitis 07/2017  . Complication of anesthesia    anaphylaxis after Methylprednisolone injection - cardiac arrest/PEA  . 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 Constipation   . Depression   . Deviated nasal septum 07/2017  . GERD (gastroesophageal reflux disease)   . History of cardiac murmur    states no known problems, no cardiologist  . Hypertension    states under control with meds., has  been on med. x 17 years  . Legally blind   . Nasal turbinate hypertrophy 07/2017  . Rash of back 08/18/2017  . Restless leg     Past Surgical History:  Procedure Laterality Date  . ABLATION ON ENDOMETRIOSIS    . BUNIONECTOMY Right   . CATARACT EXTRACTION, BILATERAL     age 59 year  . CESAREAN SECTION     x 2  . COLONOSCOPY WITH PROPOFOL N/A 08/25/2018   Procedure: COLONOSCOPY WITH PROPOFOL;  Surgeon: Danie Binder, MD;  [facility];  Service: Endoscopy;  Laterality: N/A;  2:15pm  . ENDOSCOPIC CONCHA BULLOSA RESECTION Bilateral 08/25/2017   Procedure: BILATERAL ENDOSCOPIC CONCHA BULLOSA RESECTION;  Surgeon: Leta Baptist, MD;  Location: [place];  Service: ENT;  Laterality: Bilateral;  . ETHMOIDECTOMY Bilateral 08/25/2017   Procedure: BILATERAL TOTAL ETHMOIDECTOMY;  Surgeon: Leta Baptist, MD;  Location: [place];  Service: ENT;  Laterality: Bilateral;  . EYE SURGERY    . INTRAOCULAR LENS INSERTION Bilateral    age 82s  . LUMBAR FUSION  07/05/2004  . LUMBAR LAMINECTOMY  07/05/2004   L5-S1  . MAXILLARY ANTROSTOMY Bilateral 08/25/2017   Procedure: BILATERAL MAXILLARY ANTROSTOMY WITH TISSUE REMOVAL;  Surgeon: Leta Baptist, MD;  Location: [place];  Service: ENT;  Laterality: Bilateral;  . MEMBRANE PEEL Right 11/06/2011; 08/06/2013  . NASAL SEPTOPLASTY W/ TURBINOPLASTY Bilateral 08/25/2017   Procedure: NASAL SEPTOPLASTY WITH BILATERAL TURBINATE REDUCTION;  Surgeon: Leta Baptist, MD;  Location:  [place];  Service: ENT;  Laterality: Bilateral;  . PARS PLANA VITRECTOMY Right 03/22/2011; 08/21/2011; 12/20/2011; 05/13/2012; 11/20/2012; 05/12/2013; 08/06/2013; 05/17/2016  . PARS PLANA VITRECTOMY W/ SCLERAL BUCKLE Left 04/21/2015  . PLANTAR FASCIA RELEASE Right   . SINUS ENDO W/FUSION Bilateral 08/25/2017   Procedure: ENDOSCOPIC SINUS SURGERY WITH FUSION NAVIGATION;  Surgeon: Leta Baptist, MD;  Location: [place];  Service:  ENT;  Laterality: Bilateral;  . TUBAL LIGATION     Family History:  Family History  Problem Relation Age of Onset  . Depression Mother   . Cancer Father        prostate  . Hypertension Father   . Hearing loss Father   . Other Father        dialysis  . Vision loss Daughter        cataracts  . Stroke Maternal Grandmother   . Other Paternal Grandmother        lung issues  . Vision loss Daughter        cataracts  . Colon cancer Neg Hx   . Colon polyps Neg Hx    Family Psychiatric  History: See admission H&P Social History:  Social History   Substance and Sexual Activity  Alcohol Use Yes   Comment: draNK A BOTRTLE OF WINE 04/25/2019     Social History   Substance and Sexual Activity  Drug Use No    Social History   Socioeconomic History  . Marital status: Divorced    Spouse name: Not on file  . Number of children: Not on file  . Years of education: Not on file  . Highest education level: Not on file  Occupational History  . Not on file  Social Needs  . Financial resource strain: Very hard  . Food insecurity    Worry: Often true    Inability: Often true  . Transportation needs    Medical: Yes    Non-medical: Yes  Tobacco Use  . Smoking status: Former Smoker    Packs/day: 3.00    Years: 15.00    Pack years: 45.00    Types: Cigarettes    Quit date: 05/24/2009    Years since quitting: 9.9  . Smokeless tobacco: Never Used  Substance and Sexual Activity  . Alcohol use: Yes    Comment: draNK A BOTRTLE OF WINE 04/25/2019  . Drug use: No  . Sexual activity: Not Currently    Birth control/protection: None, Surgical    Comment: tubal and ablation  Lifestyle  . Physical activity    Days per week: 0 days    Minutes per session: 0 min  . Stress: Very much  Relationships  . Social Herbalist on phone: Never    Gets together: Once a week    Attends religious service: Never    Active member of club or organization: No    Attends meetings of clubs or  organizations: Not on file    Relationship status: Divorced  Other Topics Concern  . Not on file  Social History Narrative  . Not on file   Additional Social History:    Pain Medications: KLONOPIN Withdrawal Symptoms: Nausea / Vomiting, Irritability  Sleep: Good  Appetite:  Good  Current Medications: Current Facility-Administered Medications  Medication Dose Route Frequency Provider Last Rate Last Dose  . alum & mag hydroxide-simeth (MAALOX/MYLANTA) 200-200-20 MG/5ML suspension 30 mL  30 mL Oral Q4H PRN Money, Lowry Ram, FNP      .  buPROPion (WELLBUTRIN XL) 24 hr tablet 300 mg  300 mg Oral Daily Sharma Covert, MD   300 mg at 05/04/19 2751  . cephALEXin (KEFLEX) capsule 500 mg  500 mg Oral TID Money, Lowry Ram, FNP   500 mg at 05/04/19 1206  . clonazePAM (KLONOPIN) tablet 0.5 mg  0.5 mg Oral BID PRN Money, Lowry Ram, FNP   0.5 mg at 05/03/19 2307  . linaclotide (LINZESS) capsule 145 mcg  145 mcg Oral QAC breakfast Connye Burkitt, NP   145 mcg at 05/04/19 0641  . magnesium hydroxide (MILK OF MAGNESIA) suspension 30 mL  30 mL Oral Daily PRN Money, Lowry Ram, FNP   30 mL at 05/03/19 2308  . pantoprazole (PROTONIX) EC tablet 40 mg  40 mg Oral Daily Money, Lowry Ram, FNP   40 mg at 05/04/19 7001  . traZODone (DESYREL) tablet 75 mg  75 mg Oral QHS PRN Connye Burkitt, NP   75 mg at 05/03/19 2307    Lab Results:  No results found for this or any previous visit (from the past 24 hour(s)).  Blood Alcohol level:  Lab Results  Component Value Date   ETH 54 (H) 04/24/2019   ETH 66 (H) 74/94/4967    Metabolic Disorder Labs: No results found for: HGBA1C, MPG No results found for: PROLACTIN No results found for: CHOL, TRIG, HDL, CHOLHDL, VLDL, LDLCALC  Physical Findings: AIMS: Facial and Oral Movements Muscles of Facial Expression: None, normal Lips and Perioral Area: None, normal Jaw: None, normal Tongue: None, normal,Extremity Movements Upper (arms, wrists, hands, fingers): None,  normal Lower (legs, knees, ankles, toes): None, normal, Trunk Movements Neck, shoulders, hips: None, normal, Overall Severity Severity of abnormal movements (highest score from questions above): None, normal Incapacitation due to abnormal movements: None, normal Patient's awareness of abnormal movements (rate only patient's report): No Awareness, Dental Status Current problems with teeth and/or dentures?: No Does patient usually wear dentures?: No  CIWA:  CIWA-Ar Total: 1 COWS:  COWS Total Score: 2  Musculoskeletal: Strength & Muscle Tone: within normal limits Gait & Station: normal Patient leans: N/A  Psychiatric Specialty Exam: Physical Exam  Nursing note and vitals reviewed. Constitutional: She is oriented to person, place, and time. She appears well-developed and well-nourished.  Cardiovascular: Normal rate.  Respiratory: Effort normal.  Neurological: She is alert and oriented to person, place, and time.    Review of Systems  Constitutional: Negative.   Respiratory: Negative for cough.   Cardiovascular: Negative for chest pain.  Gastrointestinal: Positive for constipation. Negative for abdominal pain, diarrhea, nausea and vomiting.  Genitourinary: Negative for dysuria, frequency and urgency.  Neurological: Negative for dizziness and headaches.  Psychiatric/Behavioral: Positive for depression. Negative for hallucinations and suicidal ideas. The patient is not nervous/anxious and does not have insomnia.   patient reports history of bilaterally decreased visual acuity and that she has been diagnosed as legally blind . Currently denies dysuria, urgency , fever , or chills   Blood pressure 116/87, pulse 84, temperature 98.2 F (36.8 C), temperature source Oral, resp. rate 20, height 5' 2.5" (1.588 m), weight 75.8 kg, SpO2 100 %.Body mass index is 30.06 kg/m.  General Appearance: Well Groomed  Eye Contact:  Good  Speech:  Normal Rate  Volume:  Normal  Mood:  reports improving  mood   Affect:  reactive, smiles/laughs briefly at times during session  Thought Process:  Linear and Descriptions of Associations: Intact  Orientation:  Full (Time, Place, and Person)  Thought  Content:  no hallucinations, no delusions, not internally preoccupied   Suicidal Thoughts:  No denies suicidal or self injurious ideations, denies homicidal or violent ideations  Homicidal Thoughts:  No  Memory:  recent and remote grossly intact   Judgement:  Other:  improving   Insight:  Fair and improving   Psychomotor Activity:  Normal  Concentration:  Concentration: Good and Attention Span: Good  Recall:  Good  Fund of Knowledge:  Good  Language:  Good  Akathisia:  No  Handed:  Right  AIMS (if indicated):     Assets:  Armed forces logistics/support/administrative officer Housing Leisure Time Resilience Social Support  ADL's:  Intact  Cognition:  WNL  Sleep:  Number of Hours: 4.75   Assessment : 53 year old female, history of depression, presented to [place] on 6/28 following overdose on Benadryl, Amolodipine, Klonopin, Alcohol. Overdose was severe and required intubation and medical management for stabilization. She was transferred to inpatient psychiatric unit on 7/4   Currently patient reports feeling better, less depressed , and denies suicidal or self injurious ideations at this time. She is tolerating Wellbutrin XL well . Denies medication side effects at this time.   Treatment Plan Summary: Daily contact with patient to assess and evaluate symptoms and progress in treatment and Medication management  Treatment Plan reviewed as below today 7/7  Encourage group /milieu participation  Continue  Linzess 145 mcg PO daily for IBS Decrease trazodone to 75 mg PO QHS PRN insomnia Continue Wellbutrin XL 300 mg PO daily for depression Continue Klonopin 0.5 mg PO BID PRN anxiety Continue Keflex 500 mg PO TID for UTI Continue Protonix 40 mg PO daily for GERD Treatment team working on disposition planning options    Jenne Campus, MD 05/04/2019, 5:01 PM   Patient ID: [name], female   DOB: 09-09-1966, 53 y.o.   MRN: [id number]

## 2019-05-05 MED ORDER — BUPROPION HCL ER (XL) 300 MG PO TB24: 300.0000 mg | ORAL_TABLET | Freq: Every day | ORAL | 0 refills | Status: DC

## 2019-05-05 NOTE — Progress Notes (Signed)
Discharge Note:  Patient discharged home with family member.  Patient denied SI and HI.  Denied A/V hallucinations.  Denied pain.  Suicide prevention information given and discussed with patient who stated she understood and had no questions.  Patient stated she received all her belongings, clothing, toiletries, misc items, etc.  Patient stated she appreciated all assistance received from [facility] staff.  All required discharge information given to patient at discharge.

## 2019-05-05 NOTE — [facility] Suicide Risk Assessment (Signed)
[facility] Discharge Suicide Risk Assessment   Principal Problem: Depression Discharge Diagnoses: Active Problems:   Major depressive disorder, severe (HCC)   Total Time spent with patient: 30 minutes  Musculoskeletal: Strength & Muscle Tone: within normal limits Gait & Station: normal Patient leans: N/A  Psychiatric Specialty Exam: ROS denies chest pain, no shortness of breath, no cough, no fever, no chills   Blood pressure 116/87, pulse 84, temperature 98.2 F (36.8 C), temperature source Oral, resp. rate 20, height 5' 2.5" (1.588 m), weight 75.8 kg, SpO2 100 %.Body mass index is 30.06 kg/m.  General Appearance: Well Groomed  Engineer, water::  Fair  Speech:  Normal Rate409  Volume:  Normal  Mood:  reports improved mood , states " I am feeling better", describes mood as 9/10 with 10 /10 being best   Affect:  Appropriate and reactive  Thought Process:  Linear and Descriptions of Associations: Intact  Orientation:  Full (Time, Place, and Person)  Thought Content:  no hallucinations,no delusions, not internally preoccupied   Suicidal Thoughts:  No denies any suicidal or self injurious ideations, denies homicidal or violent ideations  Homicidal Thoughts:  No  Memory:  recent and remote grossly intact   Judgement:  Other:  improving  Insight:  fair- improving  Psychomotor Activity:  Normal  Concentration:  Good  Recall:  Good  Fund of Knowledge:Good  Language: Good  Akathisia:  Negative  Handed:  Right  AIMS (if indicated):     Assets:  Communication Skills Desire for Improvement Resilience  Sleep:  Number of Hours: 6.75  Cognition: WNL  ADL's:  Intact   Mental Status Per Nursing Assessment::   On Admission:  Suicidal ideation indicated by patient, Self-harm behaviors  Demographic Factors:  53 year old female, lives alone, on disability   Loss Factors: Financial issues , family stressors   Historical Factors: History of depression, denies prior suicide attempts .   Risk  Reduction Factors:   Sense of responsibility to family and Positive coping skills or problem solving skills  Continued Clinical Symptoms:  At this time patient is alert, attentive, well related, pleasant , calm, mood is described as improved and currently euthymic, affect appropriate, reactive, no thought disorder, no suicidal or self injurious ideations,no homicidal or violent ideations, no hallucinations,no delusions. Behavior on unit calm and in good control. Pleasant on approach. Denies medication side effects, side effects reviewed .  Cognitive Features That Contribute To Risk:  No gross cognitive deficits noted upon discharge. Is alert , attentive, and oriented x 3    Suicide Risk:  Mild:  Suicidal ideation of limited frequency, intensity, duration, and specificity.  There are no identifiable plans, no associated intent, mild dysphoria and related symptoms, good self-control (both objective and subjective assessment), few other risk factors, and identifiable protective factors, including available and accessible social support.  Follow-up Information    Celene Squibb, MD Follow up on 05/12/2019.   Specialty: Internal Medicine Why: Medication management appointment with Faustino Congress is Wednesday, 7/15 at 10:00a.  Please bring your current medications and discharge paperwork from this hospitalization. Contact information: [address] [facility] [address] [phone]        [facility] Follow up.   Why: You have been referred to [facility] by Winnifred Friar. Please call and speak with your assigned Case Manager Francis Dowse [phone] to establish counseling services.  Contact information: Ph: [phone] Fx: No fax available.  Plan Of Care/Follow-up recommendations:  Activity:  as tolerated  Diet:  regular Tests:  NA Other:  See below  Patient is expressing readiness for discharge, and there are no current grounds for involuntary  commitment . She is leaving in good spirits. Plans to return home. Follow up as above . She has an established Ophtalmologist - Dr. Junita Push  She also has an established PCP, Dr. Nevada Crane.   Jenne Campus, MD 05/05/2019, 10:45 AM

## 2019-05-05 NOTE — Progress Notes (Signed)
D: Pt denies SI/HI/AVh. Pt is pleasant and cooperative. Pt visible on the unit, pt concern is going home.  A: Pt was offered support and encouragement. Pt was given scheduled medications. Pt was encourage to attend groups. Q 15 minute checks were done for safety.  R:Pt attends groups and interacts well with peers and staff. Pt is taking medication. Pt has no complaints.Pt receptive to treatment and safety maintained on unit.
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
## 2019-05-05 NOTE — Progress Notes (Signed)
D:  Patient's self inventory sheet, patient sleeps good, sleep medication helpful.  Good appetite, low energy level, good concentration.  Denied depression, hopeless and anxiety.  Denied withdrawals.  Denied SI.  Goal is discharge home. A:  Medications administered per MD orders.  Emotional support and encouragement given patient. R:  Denied SI and HI, contracts for safety.  Denied A/V hallucinations.  Safety maintained with 15 minute checks. Patient excited to be discharged today.

## 2019-05-05 NOTE — Progress Notes (Signed)
  [facility] Adult Case Management Discharge Plan :  Will you be returning to the same living situation after discharge:  Yes,  home At discharge, do you have transportation home?: Yes,  father will pick up at 1:50pm Do you have the ability to pay for your medications: Yes,  [employer]  Release of information consent forms completed and in the chart.  Patient to Follow up at: Follow-up Information    Celene Squibb, MD Follow up on 05/12/2019.   Specialty: Internal Medicine Why: Medication management appointment with Faustino Congress is Wednesday, 7/15 at 10:00a.  Please bring your current medications and discharge paperwork from this hospitalization. Contact information: [facility] [address] [phone]        [facility] Follow up.   Why: You have been referred to [facility] by Winnifred Friar. Please call and speak with your assigned Case Manager Francis Dowse [phone] to establish counseling services.  Contact information: Ph: [phone] Fx: No fax available.           Next level of care provider has access to [employer] and Suicide Prevention discussed: Yes,  with patient. Patient declined consents.  Have you used any form of tobacco in the last 30 days? (Cigarettes, Smokeless Tobacco, Cigars, and/or Pipes): No  Has patient been referred to the Quitline?: N/A patient is not a smoker  Patient has been referred for addiction treatment: Yes  Joellen Jersey, [place] 05/05/2019, 12:12 PM

## 2019-05-12 DIAGNOSIS — F332 Major depressive disorder, recurrent severe without psychotic features: Secondary | ICD-10-CM | POA: Diagnosis not present

## 2019-05-12 DIAGNOSIS — T1491XA Suicide attempt, initial encounter: Secondary | ICD-10-CM | POA: Diagnosis not present

## 2019-05-14 ENCOUNTER — Other Ambulatory Visit: Payer: Self-pay | Admitting: *Deleted

## 2019-05-17 NOTE — Patient Outreach (Signed)
[facility]) Care Management  05/14/2019  Sara Barnett 1966-05-28 [id number]   CSW called to follow-up with patient from discharge from 6/27 attempted overdose of multiple medications including Benadryl, amlodipine, alcohol, admitted to [facility] 7/4, discharged home on 05/05/19. Patient's daughter has moved out of the home into her grandparents' house. She denies any SI/HI/AVH.  agrees to follow up with Dr. Nevada Crane and [facility] (see d/c sum). Patient is provided with prescriptions for medication, but states that she had a concern about the Wellbutrin and had a call out to Dr. Juel Burrow office. Patient reports that she had also spoken with case manager with [facility] as referred by [facility] and plans to follow-up with her. Patient reports no further CSW needs at this time. CSW encouraged patient to call back if any needs came up again. CSW closing case.    Raynaldo Opitz, LCSW [facility]  Clinical Social Worker cell #: [phone]

## 2019-06-07 DIAGNOSIS — M25561 Pain in right knee: Secondary | ICD-10-CM | POA: Diagnosis not present

## 2019-06-07 DIAGNOSIS — M170 Bilateral primary osteoarthritis of knee: Secondary | ICD-10-CM | POA: Diagnosis not present

## 2019-06-09 DIAGNOSIS — F332 Major depressive disorder, recurrent severe without psychotic features: Secondary | ICD-10-CM | POA: Diagnosis not present

## 2019-06-21 DIAGNOSIS — M170 Bilateral primary osteoarthritis of knee: Secondary | ICD-10-CM | POA: Diagnosis not present

## 2019-06-24 DIAGNOSIS — H15012 Anterior scleritis, left eye: Secondary | ICD-10-CM | POA: Diagnosis not present

## 2019-06-24 DIAGNOSIS — H33021 Retinal detachment with multiple breaks, right eye: Secondary | ICD-10-CM | POA: Diagnosis not present

## 2019-06-24 DIAGNOSIS — Z961 Presence of intraocular lens: Secondary | ICD-10-CM | POA: Diagnosis not present

## 2019-06-24 DIAGNOSIS — H3521 Other non-diabetic proliferative retinopathy, right eye: Secondary | ICD-10-CM | POA: Insufficient documentation

## 2019-06-24 DIAGNOSIS — Z8669 Personal history of other diseases of the nervous system and sense organs: Secondary | ICD-10-CM | POA: Diagnosis not present

## 2019-06-29 DIAGNOSIS — M170 Bilateral primary osteoarthritis of knee: Secondary | ICD-10-CM | POA: Diagnosis not present

## 2019-07-06 DIAGNOSIS — M170 Bilateral primary osteoarthritis of knee: Secondary | ICD-10-CM | POA: Diagnosis not present

## 2019-07-28 ENCOUNTER — Other Ambulatory Visit: Payer: Self-pay | Admitting: Neurology

## 2019-08-10 DIAGNOSIS — M545 Low back pain: Secondary | ICD-10-CM | POA: Diagnosis not present

## 2019-08-10 DIAGNOSIS — J019 Acute sinusitis, unspecified: Secondary | ICD-10-CM | POA: Diagnosis not present

## 2019-09-10 ENCOUNTER — Telehealth: Payer: Self-pay | Admitting: Gastroenterology

## 2019-09-10 NOTE — Telephone Encounter (Signed)
PT is aware and she does not want to schedule a virtual appointment until after she tries the ducolax and Miralax.

## 2019-09-10 NOTE — Telephone Encounter (Signed)
PT has not been seen here since her Colonoscopy with Dr. Oneida Alar on 08/25/2018. She said she was taking Linzess 145 and Metamucil daily and did very well. She changed the Metamucil to an off brand medication similar and has not had as good of results. She hasn't had a Bm in a several days and did a glycerine suppository this Am with some results. She has not been seen in so long,  I offered her an appointment on Monday and she said her daughter has been checked for Covid and they haven't gotten results, although she or her daughter are sick. She just wants to know what she should do. I told her she can try going back to the regular metamucil with the Linzess since it helped more for now. She is aware I am sending a note to Dr. Oneida Alar to advise!

## 2019-09-10 NOTE — Telephone Encounter (Signed)
[phone] please call, she has a medication question

## 2019-09-10 NOTE — Telephone Encounter (Signed)
PLEASE CALL PT. SHE SHOULD TAKE DULCOLAX 5 MG PO Q4H x2. SHE CAN USE TAKE MIRALAX 17 GMS PO Q1H FROM 12N TO 6PM. DRINK 1 CUP OF LIQUID 30 MINUTES AFTER EACH DOSE: 1230P TO 630PM. SHE SHOULD REPEAT ON Saturday AS WELL.  SHE CAN MAKE A VIRTUAL VISIT IF SHE WOULD LIKE A Rx FOR LINZESS.

## 2019-09-18 IMAGING — CT CT MAXILLOFACIAL W/O CM
3 series · 14 of 47 positions shown, 16 images · non-contrast
Comparison: Head CT 08/25/2008 and MRI 08/31/2008

CLINICAL DATA: Recurrent maxillary sinusitis. Maxillary and frontal
sinus pain and pressure for 3-4 years.

EXAM:
CT MAXILLOFACIAL WITHOUT CONTRAST
TECHNIQUE: Multidetector CT images of the paranasal sinuses were obtained using
the standard protocol without intravenous contrast.

[Series 2: standard · axial · 0.33mm/px · z∈[+250,+342]mm · 8 of 109 slices shown, 10 images]
[im 8/109  brain]
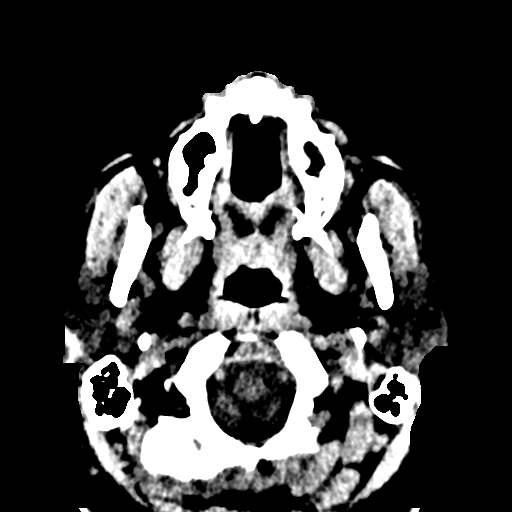
[im 8/109  bone]
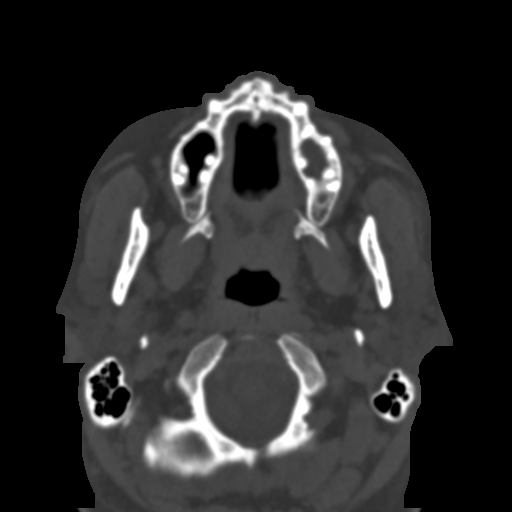
[im 23/109  bone]
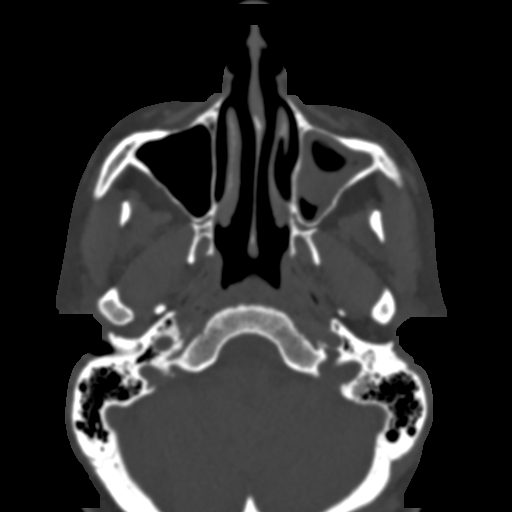
[im 34/109  bone]
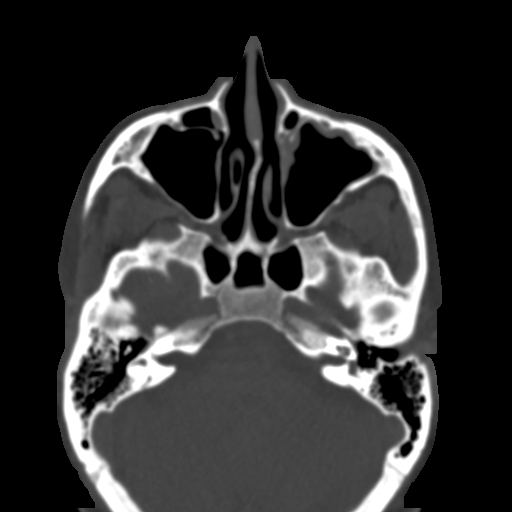
[im 49/109  bone]
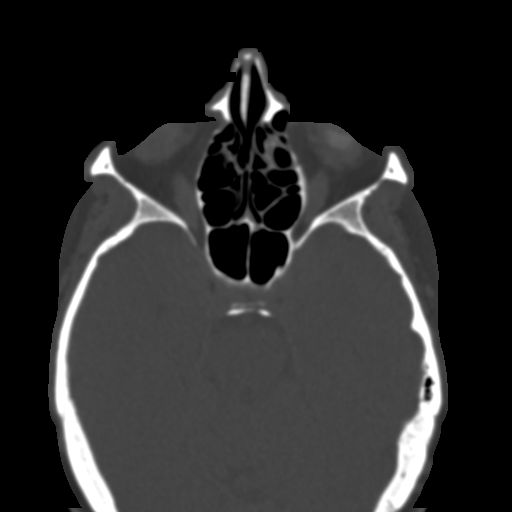
[im 60/109  brain]
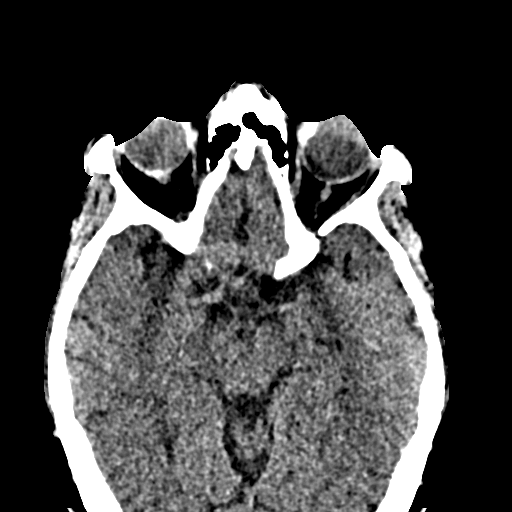
[im 60/109  bone]
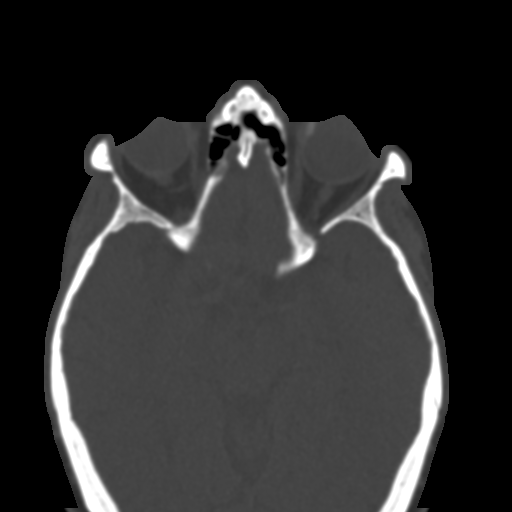
[im 75/109  bone]
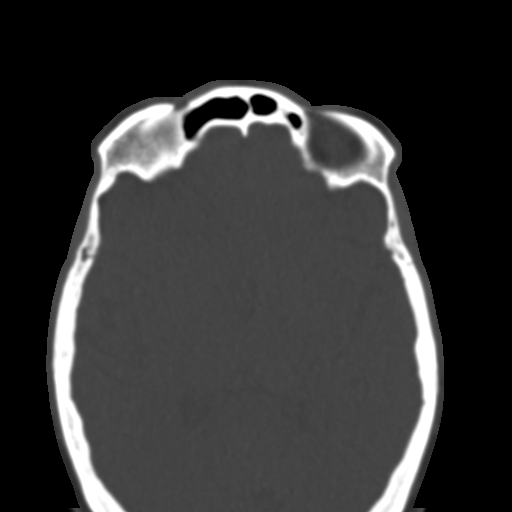
[im 86/109  bone]
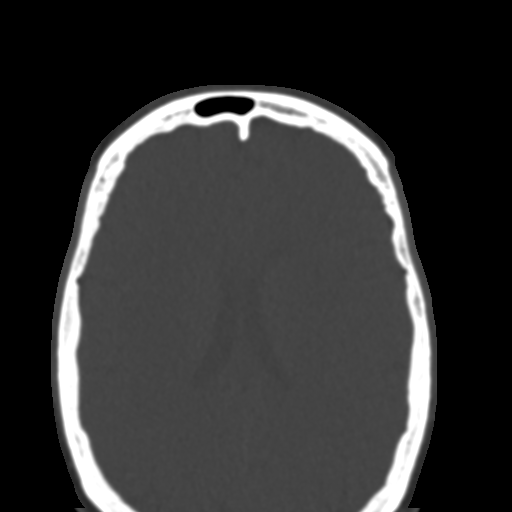
[im 101/109  bone]
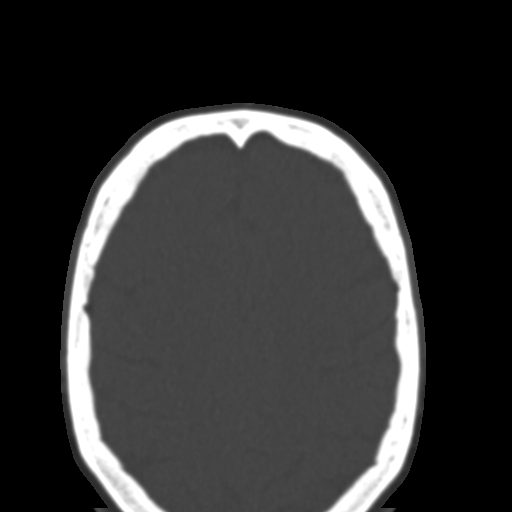

[Series 4: coronal · coronal · 0.22mm/px · 3 of 101 slices shown]
[im 34/101  bone]
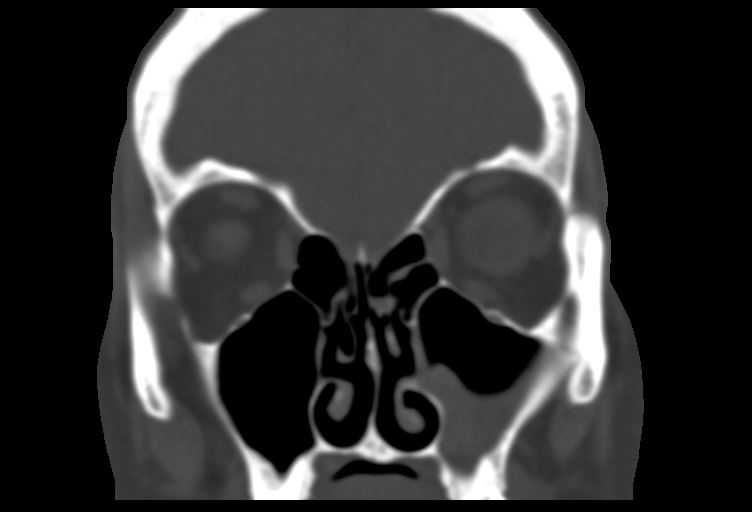
[im 45/101  bone]
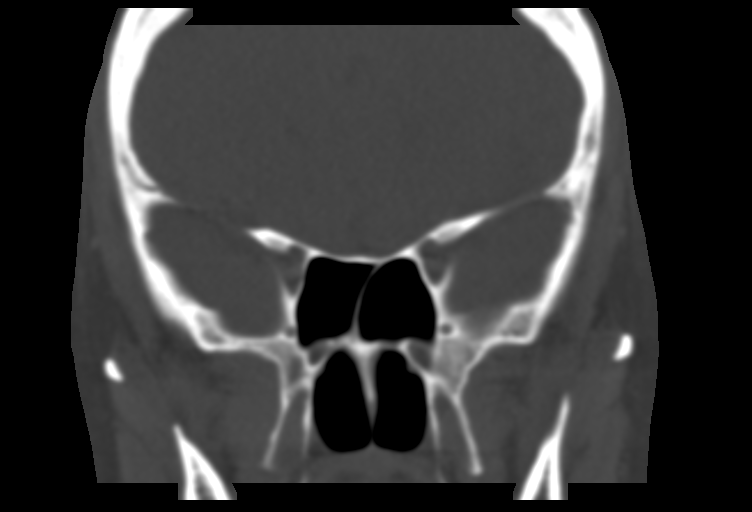
[im 56/101  bone]
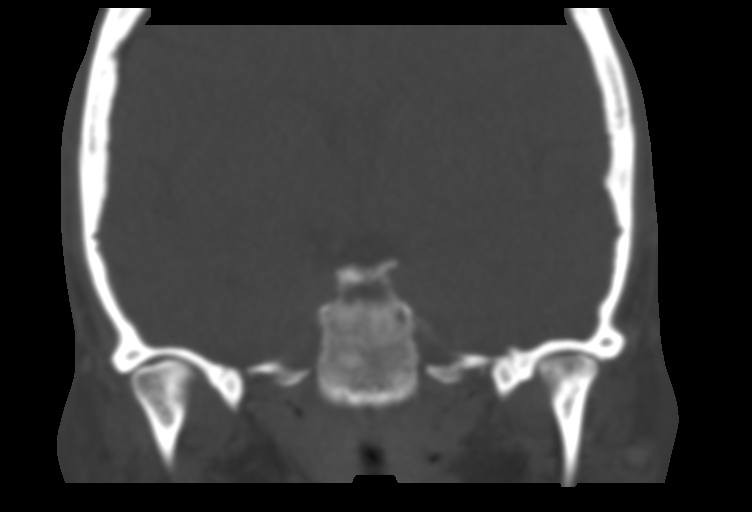

[Series 5: sagittal · sagittal · 0.23mm/px · 3 of 83 slices shown]
[im 28/83  bone]
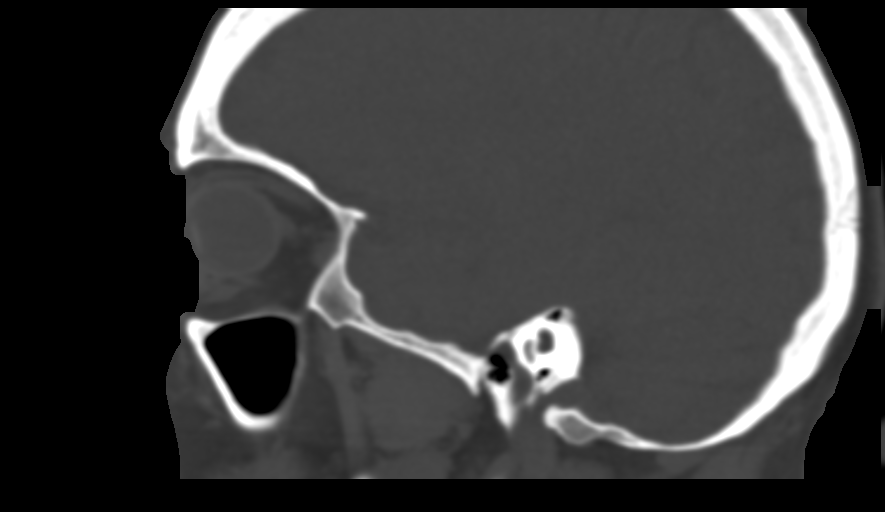
[im 42/83  bone]
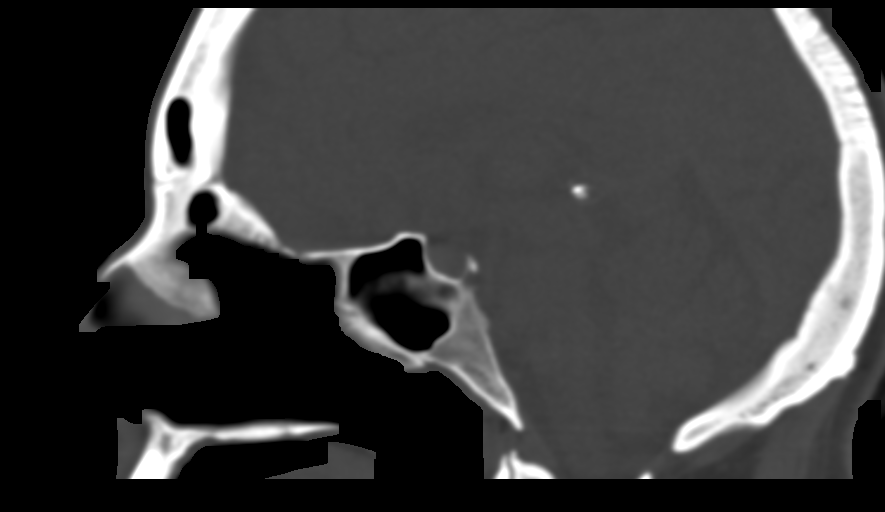
[im 55/83  bone]
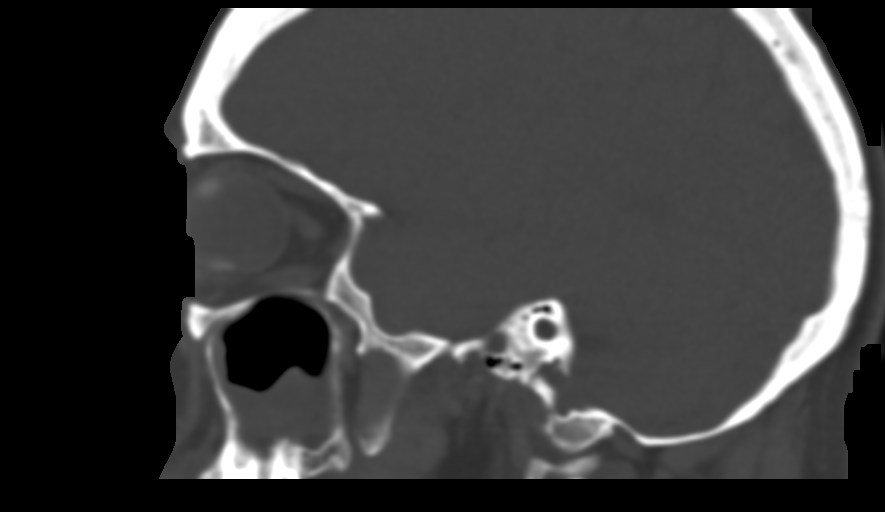

[14 of 47 positions shown; findings below may reference images not displayed]

FINDINGS: Paranasal sinuses:

Frontal: Minimal mucosal thickening inferiorly in the right frontal
sinus. Narrowed right frontal recess. Clear left frontal sinus and
patent left frontal recess.

Ethmoid: Mild bilateral anterior and left posterior ethmoid air cell
mucosal thickening.

Maxillary: Trace right and moderate left maxillary sinus mucosal
thickening.

Sphenoid: Minimal mucosal thickening effacing the sphenoid sinus
ostia bilaterally.

Right ostiomeatal unit: Patent.

Left ostiomeatal unit: Occlusion of the maxillary sinus ostium and
proximal infundibulum.

Nasal passages: Patent. Small bilateral middle turbinate concha
bullosa, right larger than left. 3 mm rightward deviation of the
anterosuperior nasal septum and 6 mm leftward deviation of the
septum more inferiorly and posteriorly.

Anatomy: No pneumatization superior to anterior ethmoid notches.
Symmetric and intact olfactory grooves and fovea ethmoidalis, Keros
I (1-3mm). Sellar sphenoid pneumatization pattern. No dehiscence of
carotid or optic canals. No onodi cell.

Other: Clear mastoid air cells and tympanic cavities bilaterally.
Bilateral cataract extraction. Left scleral banding. The visualized
portion of the brain is grossly unremarkable.
IMPRESSION: 1. Moderate left maxillary sinus mucosal thickening with proximal
OMC occlusion.
2. Mild scattered mucosal thickening elsewhere.  No fluid.
3. S-shaped nasal septal curvature.

## 2019-09-28 DIAGNOSIS — M205X9 Other deformities of toe(s) (acquired), unspecified foot: Secondary | ICD-10-CM | POA: Diagnosis not present

## 2019-09-28 DIAGNOSIS — M2580 Other specified joint disorders, unspecified joint: Secondary | ICD-10-CM | POA: Diagnosis not present

## 2019-09-28 DIAGNOSIS — M2011 Hallux valgus (acquired), right foot: Secondary | ICD-10-CM | POA: Diagnosis not present

## 2019-09-28 DIAGNOSIS — M79671 Pain in right foot: Secondary | ICD-10-CM | POA: Diagnosis not present

## 2019-09-29 DIAGNOSIS — B351 Tinea unguium: Secondary | ICD-10-CM | POA: Diagnosis not present

## 2019-10-01 DIAGNOSIS — M545 Low back pain: Secondary | ICD-10-CM | POA: Diagnosis not present

## 2019-10-01 DIAGNOSIS — F332 Major depressive disorder, recurrent severe without psychotic features: Secondary | ICD-10-CM | POA: Diagnosis not present

## 2019-10-14 ENCOUNTER — Telehealth: Payer: PPO | Admitting: Adult Health

## 2019-10-14 ENCOUNTER — Telehealth (INDEPENDENT_AMBULATORY_CARE_PROVIDER_SITE_OTHER): Payer: PPO | Admitting: Adult Health

## 2019-10-14 DIAGNOSIS — G2581 Restless legs syndrome: Secondary | ICD-10-CM

## 2019-10-14 NOTE — Progress Notes (Addendum)
PATIENT: Sara Barnett DOB: 11-Jul-1966  REASON FOR VISIT: follow up HISTORY FROM: patient  Virtual Visit via Video Note  I connected with Sara Barnett on 10/14/19 at  3:30 PM EST by a video enabled telemedicine application located remotely at [facility] and verified that I am speaking with the correct person using two identifiers who was located at their own home.   I discussed the limitations of evaluation and management by telemedicine and the availability of in person appointments. The patient expressed understanding and agreed to proceed.   PATIENT: Sara Barnett DOB: 1966/01/04  REASON FOR VISIT: follow up HISTORY FROM: patient  HISTORY OF PRESENT ILLNESS: Today 10/14/19:  Sara Barnett is a 53 year old female with a history of restless leg syndrome.  She returns today for virtual visit.  She reports that Mirapex is working well for her.  She states that restless legs is not affecting her ability to go to sleep or stay asleep.  She tolerates the medication well.  She reports that she continues to have dizziness.  She reports that she advised her PCP and they reduced her blood pressure medicine but this has continued.  She reports that the dizziness occurs with position changes particularly when she is bending down or squatting down.  She states that she will feel dizzy and most the time her vision goes black but she does not pass out.  She states that this may last for couple seconds.  She returns today for a follow-up.  HISTORY 04/13/19:  Sara Barnett is a 53 year old female with a history of restless leg syndrome.  She reports that she has been taking Mirapex 3 tablets at bedtime.  She reports that this has been beneficial for her restless leg syndrome.  She states that last week she injured her back.  It is unclear how she injured her back.  She states that she has been having some back pain but has not followed with her primary care.  She  reports that in the past she has had a spinal fusion.  She also states that his she bends down and stands up she feels lightheaded.  She states this is been going on for quite some time.  She has not mentioned this to her PCP either.  She joins me today for virtual visit.  REVIEW OF SYSTEMS: Out of a complete 14 system review of symptoms, the patient complains only of the following symptoms, and all other reviewed systems are negative.  See HPI  ALLERGIES: Allergies  Allergen Reactions  . Methylprednisolone Anaphylaxis and Other (See Comments)    CARDIAC ARREST/PEA   . Augmentin [Amoxicillin-Pot Clavulanate] Other (See Comments)    "FEELING HOT"; EXTREME SLEEPINESS  . Doxycycline Itching and Other (See Comments)    THROAT SWELLING  . Hydrocodone-Acetaminophen Itching and Other (See Comments)    THROAT SWELLING  . Statins Other (See Comments)    JOINT PAIN, swellling  . Other Other (See Comments)    Steroids - Unknown drug and strength, stopped patient's heart    HOME MEDICATIONS: Outpatient Medications Prior to Visit  Medication Sig Dispense Refill  . buPROPion (WELLBUTRIN XL) 300 MG 24 hr tablet Take 1 tablet (300 mg total) by mouth daily. 30 tablet 0  . Ferrous Gluconate (IRON) 240 (27 Fe) MG TABS Take 240 mg by mouth daily.    . lansoprazole (PREVACID) 30 MG capsule Take 30 mg by mouth daily.     Marland Kitchen linaclotide (LINZESS) 145  MCG CAPS capsule Take 1 capsule (145 mcg total) by mouth daily before breakfast. 90 capsule 3  . pramipexole (MIRAPEX) 0.125 MG tablet TAKE 3 TABLETS BY MOUTH DAILY IN THE EVENING 90 TO  120  MINUTES  BEFORE  BEDTIME 270 tablet 1   No facility-administered medications prior to visit.    PAST MEDICAL HISTORY: Past Medical History:  Diagnosis Date  . Abnormally small mouth   . Anxiety   . Arthritis    knees  . Chronic ethmoidal sinusitis 07/2017  . Chronic maxillary sinusitis 07/2017  . Complication of anesthesia    anaphylaxis after  Methylprednisolone injection - cardiac arrest/PEA  . 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 Constipation   . Depression   . Deviated nasal septum 07/2017  . GERD (gastroesophageal reflux disease)   . History of cardiac murmur    states no known problems, no cardiologist  . Hypertension    states under control with meds., has been on med. x 17 years  . Legally blind   . Nasal turbinate hypertrophy 07/2017  . Rash of back 08/18/2017  . Restless leg     PAST SURGICAL HISTORY: Past Surgical History:  Procedure Laterality Date  . ABLATION ON ENDOMETRIOSIS    . BUNIONECTOMY Right   . CATARACT EXTRACTION, BILATERAL     age 35 year  . CESAREAN SECTION     x 2  . COLONOSCOPY WITH PROPOFOL N/A 08/25/2018   Procedure: COLONOSCOPY WITH PROPOFOL;  Surgeon: Danie Binder, MD;  [facility];  Service: Endoscopy;  Laterality: N/A;  2:15pm  . ENDOSCOPIC CONCHA BULLOSA RESECTION Bilateral 08/25/2017   Procedure: BILATERAL ENDOSCOPIC CONCHA BULLOSA RESECTION;  Surgeon: Leta Baptist, MD;  Location: [place];  Service: ENT;  Laterality: Bilateral;  . ETHMOIDECTOMY Bilateral 08/25/2017   Procedure: BILATERAL TOTAL ETHMOIDECTOMY;  Surgeon: Leta Baptist, MD;  Location: [place];  Service: ENT;  Laterality: Bilateral;  . EYE SURGERY    . INTRAOCULAR LENS INSERTION Bilateral    age 51s  . LUMBAR FUSION  07/05/2004  . LUMBAR LAMINECTOMY  07/05/2004   L5-S1  . MAXILLARY ANTROSTOMY Bilateral 08/25/2017   Procedure: BILATERAL MAXILLARY ANTROSTOMY WITH TISSUE REMOVAL;  Surgeon: Leta Baptist, MD;  Location: [place];  Service: ENT;  Laterality: Bilateral;  . MEMBRANE PEEL Right 11/06/2011; 08/06/2013  . NASAL SEPTOPLASTY W/ TURBINOPLASTY Bilateral 08/25/2017   Procedure: NASAL SEPTOPLASTY WITH BILATERAL TURBINATE REDUCTION;  Surgeon: Leta Baptist, MD;  Location: [place];  Service: ENT;  Laterality: Bilateral;  . PARS PLANA VITRECTOMY Right  03/22/2011; 08/21/2011; 12/20/2011; 05/13/2012; 11/20/2012; 05/12/2013; 08/06/2013; 05/17/2016  . PARS PLANA VITRECTOMY W/ SCLERAL BUCKLE Left 04/21/2015  . PLANTAR FASCIA RELEASE Right   . SINUS ENDO W/FUSION Bilateral 08/25/2017   Procedure: ENDOSCOPIC SINUS SURGERY WITH FUSION NAVIGATION;  Surgeon: Leta Baptist, MD;  Location: [place];  Service: ENT;  Laterality: Bilateral;  . TUBAL LIGATION      FAMILY HISTORY: Family History  Problem Relation Age of Onset  . Depression Mother   . Cancer Father        prostate  . Hypertension Father   . Hearing loss Father   . Other Father        dialysis  . Vision loss Daughter        cataracts  . Stroke Maternal Grandmother   . Other Paternal Grandmother        lung issues  . Vision  loss Daughter        cataracts  . Colon cancer Neg Hx   . Colon polyps Neg Hx     SOCIAL HISTORY: Social History   Socioeconomic History  . Marital status: Divorced    Spouse name: Not on file  . Number of children: Not on file  . Years of education: Not on file  . Highest education level: Not on file  Occupational History  . Not on file  Tobacco Use  . Smoking status: Former Smoker    Packs/day: 3.00    Years: 15.00    Pack years: 45.00    Types: Cigarettes    Quit date: 05/24/2009    Years since quitting: 10.3  . Smokeless tobacco: Never Used  Substance and Sexual Activity  . Alcohol use: Yes    Comment: draNK A BOTRTLE OF WINE 04/25/2019  . Drug use: No  . Sexual activity: Not Currently    Birth control/protection: None, Surgical    Comment: tubal and ablation  Other Topics Concern  . Not on file  Social History Narrative  . Not on file   Social Determinants of Health   Financial Resource Strain: High Risk  . Difficulty of Paying Living Expenses: Very hard  Food Insecurity: Food Insecurity Present  . Worried About Charity fundraiser in the Last Year: Often true  . Ran Out of Food in the Last Year: Often true   Transportation Needs: Unmet Transportation Needs  . Lack of Transportation (Medical): Yes  . Lack of Transportation (Non-Medical): Yes  Physical Activity: Inactive  . Days of Exercise per Week: 0 days  . Minutes of Exercise per Session: 0 min  Stress: Stress Concern Present  . Feeling of Stress : Very much  Social Connections: Severely Isolated  . Frequency of Communication with Friends and Family: Never  . Frequency of Social Gatherings with Friends and Family: Once a week  . Attends Religious Services: Never  . Active Member of Clubs or Organizations: No  . Attends Archivist Meetings: Not asked  . Marital Status: Divorced  Human resources officer Violence: Not At Risk  . Fear of Current or Ex-Partner: No  . Emotionally Abused: No  . Physically Abused: No  . Sexually Abused: No      PHYSICAL EXAM Generalized: Well developed, in no acute distress   Neurological examination  Mentation: Alert oriented to time, place, history taking. Follows all commands speech and language fluent Cranial nerve II-XII:Extraocular movements were full. Facial symmetry noted. uvula tongue midline. Head turning and shoulder shrug  were normal and symmetric. Motor: Good strength throughout subjectively per patient Sensory: Sensory testing is intact to soft touch on all 4 extremities subjectively per patient Coordination: Cerebellar testing reveals good finger-nose-finger  Gait and station: Patient is able to stand from a seated position. gait is normal.  Reflexes: UTA  DIAGNOSTIC DATA (LABS, IMAGING, TESTING) - I reviewed patient records, labs, notes, testing and imaging myself where available.  Lab Results  Component Value Date   WBC 5.9 05/01/2019   HGB 14.0 05/01/2019   HCT 40.6 05/01/2019   MCV 90.6 05/01/2019   PLT 264 05/01/2019      Component Value Date/Time   NA 137 05/01/2019 0536   NA 138 12/13/2015 1646   K 3.9 05/01/2019 0536   CL 104 05/01/2019 0536   CO2 25 05/01/2019  0536   GLUCOSE 129 (H) 05/01/2019 0536   BUN 8 05/01/2019 0536   BUN 10 12/13/2015  1646   CREATININE 0.77 05/01/2019 0536   CALCIUM 8.9 05/01/2019 0536   PROT 6.1 (L) 05/01/2019 0536   PROT 7.1 12/13/2015 1646   ALBUMIN 3.1 (L) 05/01/2019 0536   ALBUMIN 4.3 12/13/2015 1646   AST 13 (L) 05/01/2019 0536   ALT 14 05/01/2019 0536   ALKPHOS 68 05/01/2019 0536   BILITOT 0.7 05/01/2019 0536   BILITOT 0.5 12/13/2015 1646   GFRNONAA >60 05/01/2019 0536   GFRAA >60 05/01/2019 0536     ASSESSMENT AND PLAN 53 y.o. year old female  has a past medical history of Abnormally small mouth, Anxiety, Arthritis, Chronic ethmoidal sinusitis (07/2017), Chronic maxillary sinusitis ([id number]), Complication of anesthesia, Concha bullosa (07/2017), Constipation, Depression, Deviated nasal septum (07/2017), GERD (gastroesophageal reflux disease), History of cardiac murmur, Hypertension, Legally blind, Nasal turbinate hypertrophy (07/2017), Rash of back (08/18/2017), and Restless leg. here with:  1.  Restless leg syndrome  Patient will continue on Mirapex 3 tablets at bedtime.  The patient also reports that her dizziness did not resolve with blood pressure medication adjustment.  Advised that it was important that she make her PCP aware.  If they feel that the dizziness may have a neurologic origin we can get her scheduled with Dr. Rexene Alberts, patient voiced understanding.  She will follow-up in 1 year or sooner if needed.  I spent 15 minutes with the patient. 50% of this time was spent reviewing plan of care   Ward Givens, MSN, NP-C 10/14/2019, 3:26 PM [facility] [address], [place], [place] [address] [phone]  I reviewed the above note and documentation by the Nurse Practitioner and agree with the history, exam, assessment and plan as outlined above. I was available for consultation. Star Age, MD, PhD [facility])

## 2019-11-01 NOTE — Progress Notes (Signed)
Referring Provider: Celene Squibb, MD Primary Care Physician:  Celene Squibb, MD Primary GI: Dr. Dr. Oneida Alar   Chief Complaint  Patient presents with  . Constipation    no BM in 1 week  . Abdominal Pain    all over and c/o swelling    HPI:   Sara Barnett is a 54 y.o. female presenting today with a history of chronic constipation, previously failing Amitiza without improvement and done best with Linzess historically. Colonoscopy completed in interim from last visit with moderate pancolonic diverticulosis, external and internal hemorrhoids, redundant left colon.   Taking Linzess 145 mcg daily. Stool was like little rocks, incomplete emptying. For the past week has been constipated without output. +flatus. Abdomen feels tight. Tried stool softeners, mineral oil, took two of the 145 mcg Linzess. No narcotics.   Past Medical History:  Diagnosis Date  . Abnormally small mouth   . Anxiety   . Arthritis    knees  . Chronic ethmoidal sinusitis 07/2017  . Chronic maxillary sinusitis 07/2017  . Complication of anesthesia    anaphylaxis after Methylprednisolone injection - cardiac arrest/PEA  . 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 Constipation   . Depression   . Deviated nasal septum 07/2017  . GERD (gastroesophageal reflux disease)   . History of cardiac murmur    states no known problems, no cardiologist  . Hypertension    states under control with meds., has been on med. x 17 years  . Legally blind   . Nasal turbinate hypertrophy 07/2017  . Rash of back 08/18/2017  . Restless leg     Past Surgical History:  Procedure Laterality Date  . ABLATION ON ENDOMETRIOSIS    . BUNIONECTOMY Right   . CATARACT EXTRACTION, BILATERAL     age 42 year  . CESAREAN SECTION     x 2  . COLONOSCOPY WITH PROPOFOL N/A 08/25/2018   moderate pancolonic diverticulosis, external and internal hemorrhoids, redundant left colon.   . ENDOSCOPIC CONCHA BULLOSA RESECTION Bilateral 08/25/2017     Procedure: BILATERAL ENDOSCOPIC CONCHA BULLOSA RESECTION;  Surgeon: Leta Baptist, MD;  Location: [place];  Service: ENT;  Laterality: Bilateral;  . ETHMOIDECTOMY Bilateral 08/25/2017   Procedure: BILATERAL TOTAL ETHMOIDECTOMY;  Surgeon: Leta Baptist, MD;  Location: [place];  Service: ENT;  Laterality: Bilateral;  . EYE SURGERY    . INTRAOCULAR LENS INSERTION Bilateral    age 29s  . LUMBAR FUSION  07/05/2004  . LUMBAR LAMINECTOMY  07/05/2004   L5-S1  . MAXILLARY ANTROSTOMY Bilateral 08/25/2017   Procedure: BILATERAL MAXILLARY ANTROSTOMY WITH TISSUE REMOVAL;  Surgeon: Leta Baptist, MD;  Location: [place];  Service: ENT;  Laterality: Bilateral;  . MEMBRANE PEEL Right 11/06/2011; 08/06/2013  . NASAL SEPTOPLASTY W/ TURBINOPLASTY Bilateral 08/25/2017   Procedure: NASAL SEPTOPLASTY WITH BILATERAL TURBINATE REDUCTION;  Surgeon: Leta Baptist, MD;  Location: [place];  Service: ENT;  Laterality: Bilateral;  . PARS PLANA VITRECTOMY Right 03/22/2011; 08/21/2011; 12/20/2011; 05/13/2012; 11/20/2012; 05/12/2013; 08/06/2013; 05/17/2016  . PARS PLANA VITRECTOMY W/ SCLERAL BUCKLE Left 04/21/2015  . PLANTAR FASCIA RELEASE Right   . SINUS ENDO W/FUSION Bilateral 08/25/2017   Procedure: ENDOSCOPIC SINUS SURGERY WITH FUSION NAVIGATION;  Surgeon: Leta Baptist, MD;  Location: [place];  Service: ENT;  Laterality: Bilateral;  . TUBAL LIGATION      Current Outpatient Medications  Medication Sig Dispense Refill  . buPROPion (WELLBUTRIN XL)  300 MG 24 hr tablet Take 1 tablet (300 mg total) by mouth daily. 30 tablet 0  . desvenlafaxine (PRISTIQ) 50 MG 24 hr tablet Take 50 mg by mouth daily.    . lansoprazole (PREVACID) 30 MG capsule Take 30 mg by mouth daily.     Marland Kitchen linaclotide (LINZESS) 145 MCG CAPS capsule Take 1 capsule (145 mcg total) by mouth daily before breakfast. 90 capsule 3  . pramipexole (MIRAPEX) 0.125 MG tablet TAKE 3 TABLETS BY MOUTH DAILY IN  THE EVENING 90 TO  120  MINUTES  BEFORE  BEDTIME 270 tablet 1  . Ferrous Gluconate (IRON) 240 (27 Fe) MG TABS Take 240 mg by mouth daily.     No current facility-administered medications for this visit.    Allergies as of 11/02/2019 - Review Complete 11/02/2019  Allergen Reaction Noted  . Methylprednisolone Anaphylaxis and Other (See Comments) 12/13/2015  . Augmentin [amoxicillin-pot clavulanate] Other (See Comments) 08/18/2017  . Doxycycline Itching and Other (See Comments) 12/18/2011  . Hydrocodone-acetaminophen Itching and Other (See Comments) 12/13/2015  . Statins Other (See Comments) 12/18/2011  . Other Other (See Comments)     Family History  Problem Relation Age of Onset  . Depression Mother   . Cancer Father        prostate  . Hypertension Father   . Hearing loss Father   . Other Father        dialysis  . Vision loss Daughter        cataracts  . Stroke Maternal Grandmother   . Other Paternal Grandmother        lung issues  . Vision loss Daughter        cataracts  . Colon cancer Neg Hx   . Colon polyps Neg Hx     Social History   Socioeconomic History  . Marital status: Divorced    Spouse name: Not on file  . Number of children: Not on file  . Years of education: Not on file  . Highest education level: Not on file  Occupational History  . Not on file  Tobacco Use  . Smoking status: Former Smoker    Packs/day: 3.00    Years: 15.00    Pack years: 45.00    Types: Cigarettes    Quit date: 05/24/2009    Years since quitting: 10.4  . Smokeless tobacco: Never Used  Substance and Sexual Activity  . Alcohol use: Yes    Comment: occasionally  . Drug use: No  . Sexual activity: Not Currently    Birth control/protection: None, Surgical    Comment: tubal and ablation  Other Topics Concern  . Not on file  Social History Narrative  . Not on file   Social Determinants of Health   Financial Resource Strain: High Risk  . Difficulty of Paying Living Expenses:  Very hard  Food Insecurity: Food Insecurity Present  . Worried About Charity fundraiser in the Last Year: Often true  . Ran Out of Food in the Last Year: Often true  Transportation Needs: Unmet Transportation Needs  . Lack of Transportation (Medical): Yes  . Lack of Transportation (Non-Medical): Yes  Physical Activity: Inactive  . Days of Exercise per Week: 0 days  . Minutes of Exercise per Session: 0 min  Stress: Stress Concern Present  . Feeling of Stress : Very much  Social Connections: Severely Isolated  . Frequency of Communication with Friends and Family: Never  . Frequency of Social Gatherings with Friends and  Family: Once a week  . Attends Religious Services: Never  . Active Member of Clubs or Organizations: No  . Attends Archivist Meetings: Not asked  . Marital Status: Divorced    Review of Systems: Gen: Denies fever, chills, anorexia. Denies fatigue, weakness, weight loss.  CV: Denies chest pain, palpitations, syncope, peripheral edema, and claudication. Resp: Denies dyspnea at rest, cough, wheezing, coughing up blood, and pleurisy. GI: see HPI Derm: Denies rash, itching, dry skin Psych: Denies depression, anxiety, memory loss, confusion. No homicidal or suicidal ideation.  Heme: Denies bruising, bleeding, and enlarged lymph nodes.  Physical Exam: BP 126/79   Pulse 89   Temp (!) 97.2 F (36.2 C) (Oral)   Ht 5\' 3"  (1.6 m)   Wt 167 lb 12.8 oz (76.1 kg)   LMP 11/01/2019   BMI 29.72 kg/m  General:   Alert and oriented. No distress noted. Pleasant and cooperative.  Head:  Normocephalic and atraumatic. Eyes:  Conjuctiva clear without scleral icterus. Abdomen:  +BS, round but soft, mild TTP lower abdomen, no rebound or guarding Msk:  Symmetrical without gross deformities. Normal posture. Extremities:  Without edema. Neurologic:  Alert and  oriented x4 Psych:  Alert and cooperative. Normal mood and affect.  ASSESSMENT: Sara Barnett is a 54 y.o.  female presenting today with constipation/obstipation, previously on Linzess 290 mcg historically but had been reduced to 145 mcg at some point due to overshooting the mark. Needs a modified bowel purge and increasing Linzess back to 290 mcg daily. No concerning physical exam findings. No signs/symptoms of obstruction.   We discussed Miralax purge at home. She is to call if no improvement and to give progress report tomorrow.    PLAN:  Miralax 1 capful in 8 ounces of water every hour up to 6 doses, repeating again in the morning if needed Resume Linzess at 290 mcg daily Miralax on any given day if no BM Call with update tomorrow Return in 3-4 months   Annitta Needs, PhD, [facility]

## 2019-11-02 ENCOUNTER — Other Ambulatory Visit: Payer: Self-pay

## 2019-11-02 ENCOUNTER — Ambulatory Visit: Payer: PPO | Admitting: Gastroenterology

## 2019-11-02 ENCOUNTER — Encounter: Payer: Self-pay | Admitting: Gastroenterology

## 2019-11-02 VITALS — BP 126/79 | HR 89 | Temp 97.2°F | Ht 63.0 in | Wt 167.8 lb

## 2019-11-02 DIAGNOSIS — K5900 Constipation, unspecified: Secondary | ICD-10-CM | POA: Diagnosis not present

## 2019-11-02 MED ORDER — LINACLOTIDE 290 MCG PO CAPS: 290.0000 ug | ORAL_CAPSULE | Freq: Every day | ORAL | 3 refills | Status: DC

## 2019-11-02 NOTE — Patient Instructions (Signed)
Today: take 1 capful of Miralax in a full 8 ounces of water every hour up to 6 doses. You can repeat this tomorrow if needed.   Make sure to drink the full 8 ounces and keep drinking water so it will work.  Please call if no improvement or results.   I have increased Linzess to 290 micrograms daily.  We will see you in 3-4 months!  I enjoyed seeing you again today! As you know, I value our relationship and want to provide genuine, compassionate, and quality care. I welcome your feedback. If you receive a survey regarding your visit,  I greatly appreciate you taking time to fill this out. See you next time!  Annitta Needs, PhD, ANP-BC [facility]

## 2019-11-03 NOTE — Progress Notes (Signed)
Cc'ed to pcp °

## 2019-11-05 DIAGNOSIS — F332 Major depressive disorder, recurrent severe without psychotic features: Secondary | ICD-10-CM | POA: Diagnosis not present

## 2019-11-20 DIAGNOSIS — Z20822 Contact with and (suspected) exposure to covid-19: Secondary | ICD-10-CM | POA: Diagnosis not present

## 2019-11-20 DIAGNOSIS — H3521 Other non-diabetic proliferative retinopathy, right eye: Secondary | ICD-10-CM | POA: Diagnosis not present

## 2019-11-22 DIAGNOSIS — L988 Other specified disorders of the skin and subcutaneous tissue: Secondary | ICD-10-CM | POA: Diagnosis not present

## 2019-11-22 DIAGNOSIS — H02051 Trichiasis without entropian right upper eyelid: Secondary | ICD-10-CM | POA: Diagnosis not present

## 2019-11-23 DIAGNOSIS — M2011 Hallux valgus (acquired), right foot: Secondary | ICD-10-CM | POA: Diagnosis not present

## 2019-11-23 DIAGNOSIS — M205X9 Other deformities of toe(s) (acquired), unspecified foot: Secondary | ICD-10-CM | POA: Diagnosis not present

## 2019-11-23 DIAGNOSIS — M722 Plantar fascial fibromatosis: Secondary | ICD-10-CM | POA: Diagnosis not present

## 2019-11-23 DIAGNOSIS — M2580 Other specified joint disorders, unspecified joint: Secondary | ICD-10-CM | POA: Diagnosis not present

## 2019-12-15 DIAGNOSIS — R41840 Attention and concentration deficit: Secondary | ICD-10-CM | POA: Diagnosis not present

## 2019-12-23 DIAGNOSIS — H33021 Retinal detachment with multiple breaks, right eye: Secondary | ICD-10-CM | POA: Diagnosis not present

## 2019-12-23 DIAGNOSIS — Z8669 Personal history of other diseases of the nervous system and sense organs: Secondary | ICD-10-CM | POA: Diagnosis not present

## 2019-12-23 DIAGNOSIS — H3521 Other non-diabetic proliferative retinopathy, right eye: Secondary | ICD-10-CM | POA: Diagnosis not present

## 2019-12-23 DIAGNOSIS — H15012 Anterior scleritis, left eye: Secondary | ICD-10-CM | POA: Diagnosis not present

## 2020-01-14 DIAGNOSIS — F332 Major depressive disorder, recurrent severe without psychotic features: Secondary | ICD-10-CM | POA: Diagnosis not present

## 2020-01-14 DIAGNOSIS — R41840 Attention and concentration deficit: Secondary | ICD-10-CM | POA: Diagnosis not present

## 2020-01-19 ENCOUNTER — Other Ambulatory Visit: Payer: Self-pay | Admitting: Podiatry

## 2020-01-28 ENCOUNTER — Other Ambulatory Visit: Payer: Self-pay | Admitting: Neurology

## 2020-02-04 DIAGNOSIS — M2011 Hallux valgus (acquired), right foot: Secondary | ICD-10-CM | POA: Diagnosis not present

## 2020-02-04 DIAGNOSIS — M722 Plantar fascial fibromatosis: Secondary | ICD-10-CM | POA: Diagnosis not present

## 2020-02-04 DIAGNOSIS — M205X9 Other deformities of toe(s) (acquired), unspecified foot: Secondary | ICD-10-CM | POA: Diagnosis not present

## 2020-02-04 DIAGNOSIS — M2580 Other specified joint disorders, unspecified joint: Secondary | ICD-10-CM | POA: Diagnosis not present

## 2020-02-04 NOTE — Patient Instructions (Signed)
Sara Barnett  02/04/2020     @PREFPERIOPPHARMACY @   Your procedure is scheduled on  02/09/2020   Report to [facility] at  0900  A.M.  Call this number if you have problems the morning of surgery:  [phone]   Remember:  Do not eat or drink after midnight.                      Take these medicines the morning of surgery with A SIP OF WATER  Amlodipine, adderall, lotensin, wellbutrin, pristiq, prevacid.    Do not wear jewelry, make-up or nail polish(on your hands or feet).  Do not wear lotions, powders, or perfumes. Please wear deodorant and brush your teeth.  Do not shave 48 hours prior to surgery.  Men may shave face and neck.  Do not bring valuables to the hospital.  [facility] is not responsible for any belongings or valuables.  Contacts, dentures or bridgework may not be worn into surgery.  Leave your suitcase in the car.  After surgery it may be brought to your room.  For patients admitted to the hospital, discharge time will be determined by your treatment team.  Patients discharged the day of surgery will not be allowed to drive home.   Name and phone number of your driver:   family Special instructions:  DO NOT smoke the day of your surgery.  Please read over the following fact sheets that you were given. Anesthesia Post-op Instructions and Care and Recovery After Surgery       Metatarsal Osteotomy, Care After This sheet gives you information about how to care for yourself after your procedure. Your health care provider may also give you more specific instructions. If you have problems or questions, contact your health care provider. What can I expect after the procedure? After the procedure, it is common to have:  Soreness.  Pain.  Stiffness.  Swelling. Follow these instructions at home: Medicines  Take over-the-counter and prescription medicines only as told by your health care provider.  Ask your health care provider if the  medicine prescribed to you can cause constipation. You may need to take steps to prevent or treat constipation, such as: ? Drink enough fluid to keep your urine pale yellow. ? Take over-the-counter or prescription medicines. ? Eat foods that are high in fiber, such as beans, whole grains, and fresh fruits and vegetables. ? Limit foods that are high in fat and processed sugars, such as fried or sweet foods. If you have a splint or walking boot:  Wear it as told by your health care provider. Remove it only as told by your health care provider.  Loosen it if your toes tingle, become numb, or turn cold and blue.  Keep it clean.  If it is not waterproof: ? Do not let it get wet. ? Cover it with a watertight covering when you take a bath or shower. Bathing  Do not take baths, swim, or use a hot tub until your health care provider approves. Ask your health care provider if you may take showers. You may only be allowed to take sponge baths.  Keep the bandage (dressing) dry. Incision care   Follow instructions from your health care provider about how to take care of your incision. Make sure you: ? Wash your hands with soap and water before you change your bandage (dressing). If soap and water are not available, use hand  sanitizer. ? Change your dressing as told by your health care provider. ? Leave stitches (sutures), skin glue, or adhesive strips in place. These skin closures may need to stay in place for 2 weeks or longer. If adhesive strip edges start to loosen and curl up, you may trim the loose edges. Do not remove adhesive strips completely unless your health care provider tells you to do that.  Check your incision area every day for signs of infection. Check for: ? More redness, swelling, or pain. ? Fluid or blood. ? Warmth. ? Pus or a bad smell. Managing pain, stiffness, and swelling   If directed, put ice on the injured area. ? If you have a removable splint, remove it as told  by your health care provider. ? Put ice in a plastic bag. ? Place a towel between your skin and the bag. ? Leave the ice on for 20 minutes, 2-3 times a day.  Move your toes often to avoid stiffness and to lessen swelling.  Raise (elevate) the injured area above the level of your heart while you are sitting or lying down. Driving  Do not drive or use heavy machinery while taking prescription pain medicine.  Do not drive for 24 hours if you were given a sedative during your procedure.  Ask your health care provider when it is safe to drive if you have a dressing, splint, special shoe, or walking boot on your foot. General instructions  Do not remove the bandage (dressing) around your foot until directed by your health care provider.  If you were given a splint, special shoe, or walking boot, wear it as told by your health care provider.  Do not use the injured limb to support your body weight until your health care provider says that you can. Use crutches or a walker as told by your health care provider.  Return to your normal activities as told by your health care provider. Ask your health care provider what activities are safe for you.  Do not use any products that contain nicotine or tobacco, such as cigarettes, e-cigarettes, and chewing tobacco. These can delay bone healing. If you need help quitting, ask your health care provider.  Keep all follow-up visits as told by your health care provider. This is important. Contact a health care provider if:  You have a fever.  Your dressing becomes wet, loose, or stained with blood or discharge.  You have pus or a bad smell coming from your incision or bandage.  Your foot becomes red, swollen, or tender.  You have pain or stiffness that does not get better or gets worse.  You have tingling or numbness in your foot that does not get better or gets worse. Get help right away if:  You develop a warm and tender swelling in your  leg.  You have chest pain.  You have trouble breathing. Summary  After the procedure, it is common to have soreness, pain, stiffness, and swelling.  Follow instructions on caring for your incision, changing your dressing, and using weight support to protect your foot.  Contact your health care provider if you have a fever, pus or a bad smell coming from your wound or dressing, or pain and stiffness do not get better.  Get help right away if you develop a warm and tender swelling in your leg, have chest pain, or trouble breathing. This information is not intended to replace advice given to you by your health care provider. Make sure  you discuss any questions you have with your health care provider. Document Revised: 08/28/2018 Document Reviewed: 08/28/2018 Elsevier Patient Education  [place]. How to Use Chlorhexidine for Bathing Chlorhexidine gluconate (CHG) is a germ-killing (antiseptic) solution that is used to clean the skin. It can get rid of the bacteria that normally live on the skin and can keep them away for about 24 hours. To clean your skin with CHG, you may be given:  A CHG solution to use in the shower or as part of a sponge bath.  A prepackaged cloth that contains CHG. Cleaning your skin with CHG may help lower the risk for infection:  While you are staying in the intensive care unit of the hospital.  If you have a vascular access, such as a central line, to provide short-term or long-term access to your veins.  If you have a catheter to drain urine from your bladder.  If you are on a ventilator. A ventilator is a machine that helps you breathe by moving air in and out of your lungs.  After surgery. What are the risks? Risks of using CHG include:  A skin reaction.  Hearing loss, if CHG gets in your ears.  Eye injury, if CHG gets in your eyes and is not rinsed out.  The CHG product catching fire. Make sure that you avoid smoking and flames after  applying CHG to your skin. Do not use CHG:  If you have a chlorhexidine allergy or have previously reacted to chlorhexidine.  On babies younger than 42 months of age. How to use CHG solution  Use CHG only as told by your health care provider, and follow the instructions on the label.  Use the full amount of CHG as directed. Usually, this is one bottle. During a shower Follow these steps when using CHG solution during a shower (unless your health care provider gives you different instructions): 1. Start the shower. 2. Use your normal soap and shampoo to wash your face and hair. 3. Turn off the shower or move out of the shower stream. 4. Pour the CHG onto a clean washcloth. Do not use any type of brush or rough-edged sponge. 5. Starting at your neck, lather your body down to your toes. Make sure you follow these instructions: ? If you will be having surgery, pay special attention to the part of your body where you will be having surgery. Scrub this area for at least 1 minute. ? Do not use CHG on your head or face. If the solution gets into your ears or eyes, rinse them well with water. ? Avoid your genital area. ? Avoid any areas of skin that have broken skin, cuts, or scrapes. ? Scrub your back and under your arms. Make sure to wash skin folds. 6. Let the lather sit on your skin for 1-2 minutes or as long as told by your health care provider. 7. Thoroughly rinse your entire body in the shower. Make sure that all body creases and crevices are rinsed well. 8. Dry off with a clean towel. Do not put any substances on your body afterward--such as powder, lotion, or perfume--unless you are told to do so by your health care provider. Only use lotions that are recommended by the manufacturer. 9. Put on clean clothes or pajamas. 10. If it is the night before your surgery, sleep in clean sheets.  During a sponge bath Follow these steps when using CHG solution during a sponge bath (unless your  health care provider gives you different instructions): 1. Use your normal soap and shampoo to wash your face and hair. 2. Pour the CHG onto a clean washcloth. 3. Starting at your neck, lather your body down to your toes. Make sure you follow these instructions: ? If you will be having surgery, pay special attention to the part of your body where you will be having surgery. Scrub this area for at least 1 minute. ? Do not use CHG on your head or face. If the solution gets into your ears or eyes, rinse them well with water. ? Avoid your genital area. ? Avoid any areas of skin that have broken skin, cuts, or scrapes. ? Scrub your back and under your arms. Make sure to wash skin folds. 4. Let the lather sit on your skin for 1-2 minutes or as long as told by your health care provider. 5. Using a different clean, wet washcloth, thoroughly rinse your entire body. Make sure that all body creases and crevices are rinsed well. 6. Dry off with a clean towel. Do not put any substances on your body afterward--such as powder, lotion, or perfume--unless you are told to do so by your health care provider. Only use lotions that are recommended by the manufacturer. 7. Put on clean clothes or pajamas. 8. If it is the night before your surgery, sleep in clean sheets. How to use CHG prepackaged cloths  Only use CHG cloths as told by your health care provider, and follow the instructions on the label.  Use the CHG cloth on clean, dry skin.  Do not use the CHG cloth on your head or face unless your health care provider tells you to.  When washing with the CHG cloth: ? Avoid your genital area. ? Avoid any areas of skin that have broken skin, cuts, or scrapes. Before surgery Follow these steps when using a CHG cloth to clean before surgery (unless your health care provider gives you different instructions): 1. Using the CHG cloth, vigorously scrub the part of your body where you will be having surgery. Scrub using  a back-and-forth motion for 3 minutes. The area on your body should be completely wet with CHG when you are done scrubbing. 2. Do not rinse. Discard the cloth and let the area air-dry. Do not put any substances on the area afterward, such as powder, lotion, or perfume. 3. Put on clean clothes or pajamas. 4. If it is the night before your surgery, sleep in clean sheets.  For general bathing Follow these steps when using CHG cloths for general bathing (unless your health care provider gives you different instructions). 1. Use a separate CHG cloth for each area of your body. Make sure you wash between any folds of skin and between your fingers and toes. Wash your body in the following order, switching to a new cloth after each step: ? The front of your neck, shoulders, and chest. ? Both of your arms, under your arms, and your hands. ? Your stomach and groin area, avoiding the genitals. ? Your right leg and foot. ? Your left leg and foot. ? The back of your neck, your back, and your buttocks. 2. Do not rinse. Discard the cloth and let the area air-dry. Do not put any substances on your body afterward--such as powder, lotion, or perfume--unless you are told to do so by your health care provider. Only use lotions that are recommended by the manufacturer. 3. Put on clean clothes or pajamas. Contact  a health care provider if:  Your skin gets irritated after scrubbing.  You have questions about using your solution or cloth. Get help right away if:  Your eyes become very red or swollen.  Your eyes itch badly.  Your skin itches badly and is red or swollen.  Your hearing changes.  You have trouble seeing.  You have swelling or tingling in your mouth or throat.  You have trouble breathing.  You swallow any chlorhexidine. Summary  Chlorhexidine gluconate (CHG) is a germ-killing (antiseptic) solution that is used to clean the skin. Cleaning your skin with CHG may help to lower your risk for  infection.  You may be given CHG to use for bathing. It may be in a bottle or in a prepackaged cloth to use on your skin. Carefully follow your health care provider's instructions and the instructions on the product label.  Do not use CHG if you have a chlorhexidine allergy.  Contact your health care provider if your skin gets irritated after scrubbing. This information is not intended to replace advice given to you by your health care provider. Make sure you discuss any questions you have with your health care provider. Document Revised: 12/31/2018 Document Reviewed: 09/11/2017 Elsevier Patient Education  [place].

## 2020-02-07 ENCOUNTER — Other Ambulatory Visit (HOSPITAL_COMMUNITY)
Admission: RE | Admit: 2020-02-07 | Discharge: 2020-02-07 | Disposition: A | Discharge status: Home / Self Care | Payer: PPO | Source: Ambulatory Visit | Attending: Podiatry | Admitting: Podiatry

## 2020-02-07 ENCOUNTER — Ambulatory Visit (HOSPITAL_COMMUNITY)
Admission: RE | Admit: 2020-02-07 | Discharge: 2020-02-07 | Disposition: A | Discharge status: Home / Self Care | Payer: PPO | Source: Ambulatory Visit | Attending: Podiatry | Admitting: Podiatry

## 2020-02-07 ENCOUNTER — Other Ambulatory Visit: Payer: Self-pay

## 2020-02-07 ENCOUNTER — Encounter (HOSPITAL_COMMUNITY)
Admission: RE | Admit: 2020-02-07 | Discharge: 2020-02-07 | Disposition: A | Discharge status: Home / Self Care | Payer: PPO | Source: Ambulatory Visit | Attending: Podiatry | Admitting: Podiatry

## 2020-02-07 ENCOUNTER — Encounter (HOSPITAL_COMMUNITY): Payer: Self-pay

## 2020-02-07 DIAGNOSIS — Z20822 Contact with and (suspected) exposure to covid-19: Secondary | ICD-10-CM | POA: Diagnosis not present

## 2020-02-07 DIAGNOSIS — Z01812 Encounter for preprocedural laboratory examination: Secondary | ICD-10-CM | POA: Insufficient documentation

## 2020-02-07 DIAGNOSIS — M2011 Hallux valgus (acquired), right foot: Secondary | ICD-10-CM | POA: Diagnosis not present

## 2020-02-07 DIAGNOSIS — Z01818 Encounter for other preprocedural examination: Secondary | ICD-10-CM | POA: Diagnosis not present

## 2020-02-07 LAB — HCG, SERUM, QUALITATIVE: Preg, Serum: NEGATIVE

## 2020-02-08 LAB — SARS CORONAVIRUS 2 (TAT 6-24 HRS): SARS Coronavirus 2: NEGATIVE

## 2020-02-09 ENCOUNTER — Ambulatory Visit (HOSPITAL_COMMUNITY): Payer: PPO

## 2020-02-09 ENCOUNTER — Encounter (HOSPITAL_COMMUNITY)
Admission: RE | Disposition: A | Discharge status: Home / Self Care | Payer: Self-pay | Source: Home / Self Care | Attending: Podiatry

## 2020-02-09 ENCOUNTER — Encounter (HOSPITAL_COMMUNITY): Payer: Self-pay | Admitting: Podiatry

## 2020-02-09 ENCOUNTER — Ambulatory Visit (HOSPITAL_COMMUNITY): Payer: PPO | Admitting: Anesthesiology

## 2020-02-09 ENCOUNTER — Ambulatory Visit (HOSPITAL_COMMUNITY)
Admission: RE | Admit: 2020-02-09 | Discharge: 2020-02-09 | Disposition: A | Discharge status: Home / Self Care | Payer: PPO | Attending: Podiatry | Admitting: Podiatry

## 2020-02-09 DIAGNOSIS — M25871 Other specified joint disorders, right ankle and foot: Secondary | ICD-10-CM | POA: Insufficient documentation

## 2020-02-09 DIAGNOSIS — M205X9 Other deformities of toe(s) (acquired), unspecified foot: Secondary | ICD-10-CM | POA: Diagnosis not present

## 2020-02-09 DIAGNOSIS — M1990 Unspecified osteoarthritis, unspecified site: Secondary | ICD-10-CM | POA: Insufficient documentation

## 2020-02-09 DIAGNOSIS — F329 Major depressive disorder, single episode, unspecified: Secondary | ICD-10-CM | POA: Insufficient documentation

## 2020-02-09 DIAGNOSIS — Z9889 Other specified postprocedural states: Secondary | ICD-10-CM

## 2020-02-09 DIAGNOSIS — M2011 Hallux valgus (acquired), right foot: Secondary | ICD-10-CM | POA: Insufficient documentation

## 2020-02-09 DIAGNOSIS — Z87891 Personal history of nicotine dependence: Secondary | ICD-10-CM | POA: Diagnosis not present

## 2020-02-09 DIAGNOSIS — M79671 Pain in right foot: Secondary | ICD-10-CM | POA: Diagnosis not present

## 2020-02-09 DIAGNOSIS — M722 Plantar fascial fibromatosis: Secondary | ICD-10-CM | POA: Diagnosis not present

## 2020-02-09 DIAGNOSIS — F419 Anxiety disorder, unspecified: Secondary | ICD-10-CM | POA: Insufficient documentation

## 2020-02-09 DIAGNOSIS — I10 Essential (primary) hypertension: Secondary | ICD-10-CM | POA: Insufficient documentation

## 2020-02-09 DIAGNOSIS — K219 Gastro-esophageal reflux disease without esophagitis: Secondary | ICD-10-CM | POA: Insufficient documentation

## 2020-02-09 HISTORY — PX: BUNIONECTOMY: SHX129

## 2020-02-09 SURGERY — BUNIONECTOMY
Anesthesia: General | Site: Foot | Laterality: Right

## 2020-02-09 MED ORDER — SUCCINYLCHOLINE CHLORIDE 20 MG/ML IJ SOLN
INTRAMUSCULAR | Status: DC | PRN
  Administered 2020-02-09: 120 mg via INTRAVENOUS

## 2020-02-09 MED ORDER — MEPERIDINE HCL 50 MG/ML IJ SOLN: 6.2500 mg | INTRAMUSCULAR | Status: DC | PRN

## 2020-02-09 MED ORDER — FENTANYL CITRATE (PF) 100 MCG/2ML IJ SOLN
INTRAMUSCULAR | Status: DC | PRN
  Administered 2020-02-09 (×2): 50 ug via INTRAVENOUS

## 2020-02-09 MED ORDER — EPHEDRINE SULFATE 50 MG/ML IJ SOLN
INTRAMUSCULAR | Status: DC | PRN
  Administered 2020-02-09: 5 mg via INTRAVENOUS
  Administered 2020-02-09: 10 mg via INTRAVENOUS
  Administered 2020-02-09: 5 mg via INTRAVENOUS

## 2020-02-09 MED ORDER — PROPOFOL 10 MG/ML IV BOLUS
INTRAVENOUS | Status: AC
  Filled 2020-02-09: qty 40

## 2020-02-09 MED ORDER — CEFAZOLIN SODIUM-DEXTROSE 2-4 GM/100ML-% IV SOLN: 2.0000 g | Freq: Once | INTRAVENOUS | Status: DC

## 2020-02-09 MED ORDER — CLINDAMYCIN PHOSPHATE 600 MG/50ML IV SOLN
INTRAVENOUS | Status: DC | PRN
  Administered 2020-02-09: 600 mg via INTRAVENOUS

## 2020-02-09 MED ORDER — CEFAZOLIN SODIUM-DEXTROSE 2-4 GM/100ML-% IV SOLN
INTRAVENOUS | Status: AC
  Filled 2020-02-09: qty 100

## 2020-02-09 MED ORDER — SODIUM CHLORIDE 0.9 % IR SOLN
Status: DC | PRN
  Administered 2020-02-09: 1000 mL

## 2020-02-09 MED ORDER — SUCCINYLCHOLINE CHLORIDE 200 MG/10ML IV SOSY
PREFILLED_SYRINGE | INTRAVENOUS | Status: AC
  Filled 2020-02-09: qty 10

## 2020-02-09 MED ORDER — LIDOCAINE HCL (PF) 1 % IJ SOLN
INTRAMUSCULAR | Status: AC
  Filled 2020-02-09: qty 30

## 2020-02-09 MED ORDER — EPHEDRINE 5 MG/ML INJ
INTRAVENOUS | Status: AC
  Filled 2020-02-09: qty 10

## 2020-02-09 MED ORDER — ONDANSETRON HCL 4 MG/2ML IJ SOLN
INTRAMUSCULAR | Status: AC
  Filled 2020-02-09: qty 2

## 2020-02-09 MED ORDER — PROPOFOL 10 MG/ML IV BOLUS
INTRAVENOUS | Status: DC | PRN
  Administered 2020-02-09: 200 mg via INTRAVENOUS

## 2020-02-09 MED ORDER — SCOPOLAMINE 1 MG/3DAYS TD PT72
MEDICATED_PATCH | TRANSDERMAL | Status: DC | PRN
  Administered 2020-02-09: 1 via TRANSDERMAL

## 2020-02-09 MED ORDER — PROMETHAZINE HCL 25 MG/ML IJ SOLN: 6.2500 mg | INTRAMUSCULAR | Status: DC | PRN

## 2020-02-09 MED ORDER — SCOPOLAMINE 1 MG/3DAYS TD PT72
MEDICATED_PATCH | TRANSDERMAL | Status: AC
  Filled 2020-02-09: qty 1

## 2020-02-09 MED ORDER — CLINDAMYCIN PHOSPHATE 600 MG/50ML IV SOLN
INTRAVENOUS | Status: AC
  Filled 2020-02-09: qty 50

## 2020-02-09 MED ORDER — CHLORHEXIDINE GLUCONATE CLOTH 2 % EX PADS: 6.0000 | MEDICATED_PAD | Freq: Once | CUTANEOUS | Status: DC

## 2020-02-09 MED ORDER — ONDANSETRON HCL 4 MG/2ML IJ SOLN
INTRAMUSCULAR | Status: DC | PRN
  Administered 2020-02-09: 4 mg via INTRAVENOUS

## 2020-02-09 MED ORDER — LACTATED RINGERS IV SOLN
Freq: Once | INTRAVENOUS | Status: AC
  Administered 2020-02-09: 1000 mL via INTRAVENOUS

## 2020-02-09 MED ORDER — FENTANYL CITRATE (PF) 100 MCG/2ML IJ SOLN
INTRAMUSCULAR | Status: AC
  Filled 2020-02-09: qty 2

## 2020-02-09 MED ORDER — LACTATED RINGERS IV SOLN: INTRAVENOUS | Status: DC | PRN

## 2020-02-09 MED ORDER — MIDAZOLAM HCL 2 MG/2ML IJ SOLN
2.0000 mg | Freq: Once | INTRAMUSCULAR | Status: AC
  Administered 2020-02-09: 08:00:00 2 mg via INTRAVENOUS
  Filled 2020-02-09: qty 2

## 2020-02-09 MED ORDER — BUPIVACAINE HCL (PF) 0.5 % IJ SOLN
INTRAMUSCULAR | Status: AC
  Filled 2020-02-09: qty 30

## 2020-02-09 MED ORDER — FENTANYL CITRATE (PF) 100 MCG/2ML IJ SOLN: 25.0000 ug | INTRAMUSCULAR | Status: DC | PRN

## 2020-02-09 MED ORDER — BUPIVACAINE HCL (PF) 0.5 % IJ SOLN
INTRAMUSCULAR | Status: DC | PRN
  Administered 2020-02-09: 16 mL

## 2020-02-09 SURGICAL SUPPLY — 56 items
APL PRP STRL LF DISP 70% ISPRP (MISCELLANEOUS) ×1
APL SKNCLS STERI-STRIP NONHPOA (GAUZE/BANDAGES/DRESSINGS) ×1
BANDAGE ELASTIC 4 VELCRO NS (GAUZE/BANDAGES/DRESSINGS) ×3 IMPLANT
BANDAGE ESMARK 4X12 BL STRL LF (DISPOSABLE) ×1 IMPLANT
BENZOIN TINCTURE PRP APPL 2/3 (GAUZE/BANDAGES/DRESSINGS) ×3 IMPLANT
BIT DRILL MICR ACTRK 2 LNG PRF (BIT) IMPLANT
BLADE AVERAGE 25MMX9MM (BLADE) ×1
BLADE AVERAGE 25X9 (BLADE) ×1 IMPLANT
BLADE SURG 15 STRL LF DISP TIS (BLADE) ×2 IMPLANT
BLADE SURG 15 STRL SS (BLADE) ×12
BNDG CMPR 12X4 ELC STRL LF (DISPOSABLE) ×1
BNDG CMPR STD VLCR NS LF 5.8X4 (GAUZE/BANDAGES/DRESSINGS) ×1
BNDG CONFORM 2 STRL LF (GAUZE/BANDAGES/DRESSINGS) ×3 IMPLANT
BNDG ELASTIC 4X5.8 VLCR NS LF (GAUZE/BANDAGES/DRESSINGS) ×3 IMPLANT
BNDG ESMARK 4X12 BLUE STRL LF (DISPOSABLE) ×3
BNDG GAUZE ELAST 4 BULKY (GAUZE/BANDAGES/DRESSINGS) ×3 IMPLANT
CHLORAPREP W/TINT 26 (MISCELLANEOUS) ×3 IMPLANT
CLOSURE WOUND 1/2 X4 (GAUZE/BANDAGES/DRESSINGS) ×1
CLOTH BEACON ORANGE TIMEOUT ST (SAFETY) ×3 IMPLANT
COVER LIGHT HANDLE STERIS (MISCELLANEOUS) ×6 IMPLANT
COVER WAND RF STERILE (DRAPES) ×3 IMPLANT
CUFF TOURN SGL QUICK 18X4 (TOURNIQUET CUFF) ×3 IMPLANT
DECANTER SPIKE VIAL GLASS SM (MISCELLANEOUS) ×6 IMPLANT
DRAPE OEC MINIVIEW 54X84 (DRAPES) ×3 IMPLANT
DRILL MICRO ACUTRAK 2 LNG PROF (BIT) ×3
DRSG ADAPTIC 3X8 NADH LF (GAUZE/BANDAGES/DRESSINGS) ×3 IMPLANT
ELECT REM PT RETURN 9FT ADLT (ELECTROSURGICAL) ×3
ELECTRODE REM PT RTRN 9FT ADLT (ELECTROSURGICAL) ×1 IMPLANT
GAUZE SPONGE 4X4 12PLY STRL (GAUZE/BANDAGES/DRESSINGS) ×3 IMPLANT
GLOVE BIO SURGEON STRL SZ7.5 (GLOVE) ×3 IMPLANT
GLOVE BIOGEL PI IND STRL 7.0 (GLOVE) ×2 IMPLANT
GLOVE BIOGEL PI IND STRL 7.5 (GLOVE) ×1 IMPLANT
GLOVE BIOGEL PI INDICATOR 7.0 (GLOVE) ×4
GLOVE BIOGEL PI INDICATOR 7.5 (GLOVE) ×2
GLOVE ECLIPSE 6.5 STRL STRAW (GLOVE) ×2 IMPLANT
GLOVE ECLIPSE 7.0 STRL STRAW (GLOVE) ×3 IMPLANT
GOWN STRL REUS W/ TWL LRG LVL3 (GOWN DISPOSABLE) ×1 IMPLANT
GOWN STRL REUS W/TWL LRG LVL3 (GOWN DISPOSABLE) ×9 IMPLANT
GUIDEWIRE ORTHO MICROSHT  ACUT (WIRE) ×2
GUIDEWIRE ORTHO MICROSHT .035 (WIRE) ×1 IMPLANT
MANIFOLD NEPTUNE II (INSTRUMENTS) ×3 IMPLANT
NDL HYPO 27GX1-1/4 (NEEDLE) ×3 IMPLANT
NEEDLE HYPO 27GX1-1/4 (NEEDLE) ×9 IMPLANT
NS IRRIG 1000ML POUR BTL (IV SOLUTION) ×3 IMPLANT
PACK BASIC LIMB (CUSTOM PROCEDURE TRAY) ×3 IMPLANT
PAD ARMBOARD 7.5X6 YLW CONV (MISCELLANEOUS) ×3 IMPLANT
RASP SM TEAR CROSS CUT (RASP) ×2 IMPLANT
SCREW ACUTRAK 2 MICRO 20MM (Screw) ×3 IMPLANT
SET BASIN LINEN APH (SET/KITS/TRAYS/PACK) ×3 IMPLANT
SPONGE LAP 18X18 RF (DISPOSABLE) ×3 IMPLANT
STRIP CLOSURE SKIN 1/2X4 (GAUZE/BANDAGES/DRESSINGS) ×1 IMPLANT
SUT PROLENE 3 0 PS 2 (SUTURE) ×2 IMPLANT
SUT VIC AB 2-0 CT2 27 (SUTURE) ×3 IMPLANT
SUT VIC AB 4-0 PS2 27 (SUTURE) ×7 IMPLANT
SUT VICRYL AB 3-0 FS1 BRD 27IN (SUTURE) ×3 IMPLANT
SYR CONTROL 10ML LL (SYRINGE) ×6 IMPLANT

## 2020-02-09 NOTE — Anesthesia Postprocedure Evaluation (Signed)
Anesthesia Post Note  Patient: Sara Barnett  Procedure(s) Performed: BUNIONECTOMY RIGHT FOOT WITH SESAMOIDECTOMY, LENGTHENING OF RIGHT EXTENSOR HALLUCIS LONGUS TENDON, PLANTAR FASCIA RELEASE OF RIGHT FOOT (Right Foot)  Patient location during evaluation: PACU Anesthesia Type: General Level of consciousness: awake and alert and patient cooperative Pain management: satisfactory to patient Respiratory status: spontaneous breathing Cardiovascular status: stable Postop Assessment: no apparent nausea or vomiting Anesthetic complications: no     Last Vitals:  Vitals:   02/09/20 1115 02/09/20 1119  BP: 109/74   Pulse: 73 75  Resp: (!) 23 17  Temp:    SpO2: 91% 96%    Last Pain:  Vitals:   02/09/20 1119  TempSrc:   PainSc: 5                  Addelyn Alleman

## 2020-02-09 NOTE — Discharge Instructions (Signed)
These instructions will give you an idea of what to expect after surgery and how to manage issues that may arise before your first post op office visit.  Pain Management Pain is best managed by "staying ahead" of it. If pain gets out of control, it is difficult to get it back under control. Local anesthesia that lasts 6-8 hours is used to numb the foot and decrease pain.  For the best pain control, take the pain medication every 4 hours for the first 2 days post op. On the third day pain medication can be taken as needed.   Post Op Nausea Nausea is common after surgery, so it is managed proactively.  If prescribed, use the prescribed nausea medication regularly for the first 2 days post op.  Bandages Do not worry if there is blood on the bandage. What looks like a lot of blood on the bandage is actually a small amount. Blood on the dressing spreads out as it is absorbed by the gauze, the same way a drop of water spreads out on a paper towel.  If the bandages feel wet or dry, stiff and uncomfortable, call the office during office hours and we will schedule a time for you to have the bandage changed.  Unless you are specifically told otherwise, we will do the first bandage change in the office.  Keep your bandage dry. If the bandage becomes wet or soiled, notify the office and we will schedule a time to change the bandage.  Activity It is best to spend most of the first 2 days after surgery lying down with the foot elevated above the level of your heart. You may put weight on your heel while wearing the CAM Walker (black boot). You may only get up to go to the restroom.  Driving Do not drive until you are able to respond in an emergency (i.e. slam on the brakes). This usually occurs after the bone has healed - 6 to 8 weeks.  Call the Office If you have a fever over 101F.  If you have increasing pain after the initial post op pain has settled down.  If you have increasing redness, swelling,  or drainage.  If you have any questions or concerns.       General Anesthesia, Adult, Care After This sheet gives you information about how to care for yourself after your procedure. Your health care provider may also give you more specific instructions. If you have problems or questions, contact your health care provider. What can I expect after the procedure? After the procedure, the following side effects are common:  Pain or discomfort at the IV site.  Nausea.  Vomiting.  Sore throat.  Trouble concentrating.  Feeling cold or chills.  Weak or tired.  Sleepiness and fatigue.  Soreness and body aches. These side effects can affect parts of the body that were not involved in surgery. Follow these instructions at home:  For at least 24 hours after the procedure:  Have a responsible adult stay with you. It is important to have someone help care for you until you are awake and alert.  Rest as needed.  Do not: ? Participate in activities in which you could fall or become injured. ? Drive. ? Use heavy machinery. ? Drink alcohol. ? Take sleeping pills or medicines that cause drowsiness. ? Make important decisions or sign legal documents. ? Take care of children on your own. Eating and drinking  Follow any instructions from your health care   provider about eating or drinking restrictions.  When you feel hungry, start by eating small amounts of foods that are soft and easy to digest (bland), such as toast. Gradually return to your regular diet.  Drink enough fluid to keep your urine pale yellow.  If you vomit, rehydrate by drinking water, juice, or clear broth. General instructions  If you have sleep apnea, surgery and certain medicines can increase your risk for breathing problems. Follow instructions from your health care provider about wearing your sleep device: ? Anytime you are sleeping, including during daytime naps. ? While taking prescription pain medicines,  sleeping medicines, or medicines that make you drowsy.  Return to your normal activities as told by your health care provider. Ask your health care provider what activities are safe for you.  Take over-the-counter and prescription medicines only as told by your health care provider.  If you smoke, do not smoke without supervision.  Keep all follow-up visits as told by your health care provider. This is important. Contact a health care provider if:  You have nausea or vomiting that does not get better with medicine.  You cannot eat or drink without vomiting.  You have pain that does not get better with medicine.  You are unable to pass urine.  You develop a skin rash.  You have a fever.  You have redness around your IV site that gets worse. Get help right away if:  You have difficulty breathing.  You have chest pain.  You have blood in your urine or stool, or you vomit blood. Summary  After the procedure, it is common to have a sore throat or nausea. It is also common to feel tired.  Have a responsible adult stay with you for the first 24 hours after general anesthesia. It is important to have someone help care for you until you are awake and alert.  When you feel hungry, start by eating small amounts of foods that are soft and easy to digest (bland), such as toast. Gradually return to your regular diet.  Drink enough fluid to keep your urine pale yellow.  Return to your normal activities as told by your health care provider. Ask your health care provider what activities are safe for you. This information is not intended to replace advice given to you by your health care provider. Make sure you discuss any questions you have with your health care provider. Document Revised: 10/17/2017 Document Reviewed: 05/30/2017 Elsevier Patient Education  2020 [employer]  

## 2020-02-09 NOTE — H&P (Signed)
HISTORY AND PHYSICAL INTERVAL NOTE:  02/09/2020  8:29 AM  Sara Barnett  has presented today for surgery, with the diagnosis of hallux valgus of right foot, hallux extensus of right foot, sesamoiditis and plantar fasciitis of the right foot.  The various methods of treatment have been discussed with the patient.  No guarantees were given.  After consideration of risks, benefits and other options for treatment, the patient has consented to surgery.  I have reviewed the patients' chart and labs.    Patient Vitals for the past 24 hrs:  BP Temp Temp src Pulse Resp SpO2  02/09/20 0736 110/77 97.8 F (36.6 C) Oral 75 18 95 %    A history and physical examination was performed in my office.  The patient was reexamined.  There have been no changes to this history and physical examination.  Marcheta Grammes, DPM

## 2020-02-09 NOTE — Anesthesia Procedure Notes (Signed)
Procedure Name: Intubation Date/Time: 02/09/2020 8:42 AM Performed by: Vista Deck, CRNA Pre-anesthesia Checklist: Patient identified, Patient being monitored, Timeout performed, Emergency Drugs available and Suction available Patient Re-evaluated:Patient Re-evaluated prior to induction Oxygen Delivery Method: Circle System Utilized Preoxygenation: Pre-oxygenation with 100% oxygen Induction Type: IV induction Laryngoscope Size: Mac and 3 Grade View: Grade II Tube type: Oral Tube size: 7.0 mm Number of attempts: 1 Airway Equipment and Method: stylet and Oral airway Placement Confirmation: ETT inserted through vocal cords under direct vision,  positive ETCO2 and breath sounds checked- equal and bilateral Secured at: 21 cm Tube secured with: Tape Dental Injury: Teeth and Oropharynx as per pre-operative assessment

## 2020-02-09 NOTE — Brief Op Note (Signed)
BRIEF OPERATIVE NOTE  DATE OF PROCEDURE 02/09/2020  SURGEON Marcheta Grammes, DPM  ASSISTANT SURGEON Jilda Panda, DPM  OR STAFF Circulator: Jeanell Sparrow Fransisco Hertz, RN Scrub Person: Lucie Leather, CST Vendor Representative : Amelia Jo   PREOPERATIVE DIAGNOSIS 1.  Hallux valgus, right foot 2.  Hallux extensus, right foot 3.  Fibular sesamoiditis, right foot 4.  Plantar fasciitis, right foot 5.  Pain, right foot  POSTOPERATIVE DIAGNOSIS Same  PROCEDURE 1.  Bunionectomy with distal 1st metatarsal osteotomy and fibular sesamoidectomy, right foot 2.  Lengthening of the extensor hallucis longus tendon, right foot 3.  Plantar fasciotomy, right foot  ANESTHESIA General   HEMOSTASIS Pneumatic ankle tourniquet set at 250 mmHg  ESTIMATED BLOOD LOSS Minimal (<5 cc)  MATERIALS USED Acumed Micro Acutrak screw 20 mm length x 1  INJECTABLES 0.5% Marcaine plain  PATHOLOGY Fibular sesamoid, right foot  COMPLICATIONS None

## 2020-02-09 NOTE — Anesthesia Preprocedure Evaluation (Addendum)
Anesthesia Evaluation  Patient identified by MRN, date of birth, ID band Patient awake    Reviewed: Allergy & Precautions, NPO status , Patient's Chart, lab work & pertinent test results  History of Anesthesia Complications (+) history of anesthetic complications (allergic steroids)  Airway Mallampati: II  TM Distance: >3 FB Neck ROM: Full    Dental no notable dental hx. (+) Teeth Intact, Dental Advisory Given   Pulmonary neg pulmonary ROS, former smoker,    Pulmonary exam normal breath sounds clear to auscultation       Cardiovascular Exercise Tolerance: Good hypertension, Pt. on medications Normal cardiovascular examI Rhythm:Regular Rate:Normal  25-Apr-2019 00:13:02 [facility] ROUTINE RECORD Sinus rhythm Prolonged PR interval Borderline left axis deviation Low voltage, precordial leads Abnormal ECG Confirmed by Carmin Muskrat ([id number]) on 04/26/2019 10:19:51 AM 90mm/s 28mm/mV 150Hz  9.0.4 CID: [id number] Confirmed By: Herbie Baltimore   Neuro/Psych PSYCHIATRIC DISORDERS Anxiety Depression negative neurological ROS  negative psych ROS   GI/Hepatic GERD  Medicated and Controlled,(+)     substance abuse  alcohol use,   Endo/Other  negative endocrine ROS  Renal/GU negative Renal ROS  negative genitourinary   Musculoskeletal  (+) Arthritis , Osteoarthritis,    Abdominal   Peds negative pediatric ROS (+)  Hematology negative hematology ROS (+)   Anesthesia Other Findings   Reproductive/Obstetrics negative OB ROS                            Anesthesia Physical  Anesthesia Plan  ASA: II  Anesthesia Plan: General   Post-op Pain Management:    Induction: Intravenous  PONV Risk Score and Plan: 4 or greater and Ondansetron and Midazolam  Airway Management Planned: Oral ETT  Additional Equipment:   Intra-op Plan:   Post-operative Plan: Extubation in OR  Informed Consent:  I have reviewed the patients History and Physical, chart, labs and discussed the procedure including the risks, benefits and alternatives for the proposed anesthesia with the patient or authorized representative who has indicated his/her understanding and acceptance.     Dental advisory given  Plan Discussed with: CRNA  Anesthesia Plan Comments:        Anesthesia Quick Evaluation

## 2020-02-09 NOTE — Op Note (Signed)
OPERATIVE NOTE  DATE OF PROCEDURE 02/09/2020  SURGEON Marcheta Grammes, DPM  ASSISTANT SURGEON Jilda Panda, DPM  OR STAFF Circulator: Jeanell Sparrow Fransisco Hertz, RN Scrub Person: Lucie Leather, CST Vendor Representative : Amelia Jo   PREOPERATIVE DIAGNOSIS 1.  Hallux valgus, right foot 2.  Hallux extensus, right foot 3.  Fibular sesamoiditis, right foot 4.  Plantar fasciitis, right foot 5.  Pain, right foot  POSTOPERATIVE DIAGNOSIS Same  PROCEDURE 1.  Bunionectomy with distal 1st metatarsal osteotomy and fibular sesamoidectomy, right foot 2.  Lengthening of the extensor hallucis longus tendon, right foot 3.  Plantar fasciotomy, right foot  ANESTHESIA General   HEMOSTASIS Pneumatic ankle tourniquet set at 250 mmHg  ESTIMATED BLOOD LOSS Minimal (<5 cc)  MATERIALS USED Acumed Micro Acutrak screw 20 mm length x 1  INJECTABLES 0.5% Marcaine plain  PATHOLOGY Fibular sesamoid, right foot  COMPLICATIONS None  INDICATIONS:  The patient has a longstanding history of an extensus deformity of her right hallux following a previous bunionectomy by another provider.  She also has history of pain along the plantar aspect of the right fibular sesamoid nonresponsive to alterations in shoe gear.  She also has a history of right plantar fascial pain.  DESCRIPTION OF THE PROCEDURE:  The patient was brought to the operating room and placed on the operative table in the supine position.  A pneumatic ankle tourniquet was applied to the operative extremity.  A timeout was performed.  Following sedation, the surgical site was anesthetized with 0.5% Marcaine plain.  The foot was then prepped, scrubbed, and draped in the usual sterile technique.  A second timeout was performed.  The foot was elevated, exsanguinated and the pneumatic ankle tourniquet inflated to 250 mmHg.    Attention was directed to the dorsal medial aspect of the right foot where a well-healed surgical scar was  identified.  Two converging semielliptical incisions were made encompassing the entire length of the surgical scar.  The wedge of skin was removed and passed from the operative field.  The incision was deepened through the subcutaneous tissues down to the level of the first metatarsal phalangeal joint capsule.  An inverted L type capsulotomy was performed.  The periosteal and capsular structures were reflected exposing the first metatarsal head.  Attention was directed to the first interspace via the original skin incision.  Dissection was continued deep through the subcutaneous tissues to the level of the fibular sesamoid.  The fibular sesamoid was identified and freed of all soft tissue attachments.  The fibular sesamoid sesamoid was removed and passed from the operative field.  It was submitted to pathology for evaluation.  Attention was redirected to the medial aspect of the first metatarsal head.  A distal chevron osteotomy was performed using a power bone saw.  The capital fragment was distracted and shifted into a more corrected lateral position.  The osteotomy was fixated with Acumed micro Acutrack screw measuring 20 mm in length.  There was adequate compression of the osteotomy.  Position of the screw was confirmed with fluoroscopy and found to be acceptable.  The surgical wound was irrigated with copious amounts of sterile irrigant.  A medial capsulorrhaphy was performed using 2-0 Vicryl.  The periosteal and capsular structures were reapproximated using 3-0 Vicryl.  The position of the hallux was found to be in rectus position in the transverse plane but remained dorsiflexed in the sagittal plane.  I did not recommend proceeding with an Aiken osteotomy.  However, I did recommend proceeding  with the planned lengthening of the extensor hallucis longus tendon.  A Z-lengthening of the extensor hallucis longus tendon was performed.  The sagittal plane position of the hallux reduced.  The extensor tendon  was reapproximated using 4-0 Vicryl.  The surgical wound was irrigated with copious amounts of sterile irrigant.  The subcutaneous structures were reapproximated using 4-0 Vicryl.  The skin was reapproximated using 4-0 Vicryl in a running subcuticular manner.  Attention was directed to the plantar aspect of the right foot.  A transverse incision was made distal to the plantar heel pad.  Dissection was continued deep down to the level of the plantar fascia.  The medial one third band of the plantar fascia was transected using a #15 blade.  The surgical wound was irrigated with copious amounts of sterile irrigant.  The subcutaneous structures were reapproximated using 4-0 Vicryl.  The skin was reapproximated using 3-0 Prolene and a horizontal mattress and simple suture technique.  All wound closures were reinforced with Steri-Strips.  A sterile compressive dressing was applied to the right foot.  The pneumatic ankle tourniquet was deflated and a prompt hyperemic response was noted to all digits of the right foot.   The patient tolerated the procedure well.  The patient was then transferred to PACU with vital signs stable and vascular status intact to all toes of the operative foot.

## 2020-02-09 NOTE — Transfer of Care (Addendum)
Immediate Anesthesia Transfer of Care Note  Patient: Sara Barnett  Procedure(s) Performed: BUNIONECTOMY RIGHT FOOT WITH SESAMOIDECTOMY, LENGTHENING OF RIGHT EXTENSOR HALLUCIS LONGUS TENDON, PLANTAR FASCIA RELEASE OF RIGHT FOOT (Right Foot)  Patient Location: PACU  Anesthesia Type:General  Level of Consciousness: awake and patient cooperative  Airway & Oxygen Therapy: Patient Spontanous Breathing and Patient connected to nasal cannula oxygen  Post-op Assessment: Report given to RN and Post -op Vital signs reviewed and stable  Post vital signs: Reviewed and stable  Last Vitals:  Vitals Value Taken Time  BP 105/71 02/09/20 1039  Temp 36.5   Pulse 81 02/09/20 1041  Resp 21 02/09/20 1041  SpO2 97 % 02/09/20 1041  Vitals shown include unvalidated device data.  Last Pain:  Vitals:   02/09/20 0736  TempSrc: Oral  PainSc: 9       Patients Stated Pain Goal: 9 ([id number])  Complications: No apparent anesthesia complications

## 2020-02-11 LAB — SURGICAL PATHOLOGY

## 2020-02-16 ENCOUNTER — Ambulatory Visit: Payer: PPO | Admitting: Gastroenterology

## 2020-02-25 DIAGNOSIS — Z4889 Encounter for other specified surgical aftercare: Secondary | ICD-10-CM | POA: Diagnosis not present

## 2020-03-02 ENCOUNTER — Encounter: Payer: Self-pay | Admitting: Cardiology

## 2020-03-02 DIAGNOSIS — F331 Major depressive disorder, recurrent, moderate: Secondary | ICD-10-CM | POA: Diagnosis not present

## 2020-03-02 DIAGNOSIS — B9629 Other Escherichia coli [E. coli] as the cause of diseases classified elsewhere: Secondary | ICD-10-CM | POA: Diagnosis not present

## 2020-03-02 DIAGNOSIS — E663 Overweight: Secondary | ICD-10-CM | POA: Diagnosis not present

## 2020-03-02 DIAGNOSIS — Z136 Encounter for screening for cardiovascular disorders: Secondary | ICD-10-CM | POA: Diagnosis not present

## 2020-03-02 DIAGNOSIS — E6609 Other obesity due to excess calories: Secondary | ICD-10-CM | POA: Diagnosis not present

## 2020-03-02 DIAGNOSIS — J0100 Acute maxillary sinusitis, unspecified: Secondary | ICD-10-CM | POA: Diagnosis not present

## 2020-03-02 DIAGNOSIS — E785 Hyperlipidemia, unspecified: Secondary | ICD-10-CM | POA: Diagnosis not present

## 2020-03-02 DIAGNOSIS — F332 Major depressive disorder, recurrent severe without psychotic features: Secondary | ICD-10-CM | POA: Diagnosis not present

## 2020-03-02 DIAGNOSIS — F411 Generalized anxiety disorder: Secondary | ICD-10-CM | POA: Diagnosis not present

## 2020-03-02 DIAGNOSIS — E782 Mixed hyperlipidemia: Secondary | ICD-10-CM | POA: Diagnosis not present

## 2020-03-02 DIAGNOSIS — F329 Major depressive disorder, single episode, unspecified: Secondary | ICD-10-CM | POA: Diagnosis not present

## 2020-03-02 DIAGNOSIS — F39 Unspecified mood [affective] disorder: Secondary | ICD-10-CM | POA: Diagnosis not present

## 2020-03-02 DIAGNOSIS — M62830 Muscle spasm of back: Secondary | ICD-10-CM | POA: Diagnosis not present

## 2020-03-02 DIAGNOSIS — R609 Edema, unspecified: Secondary | ICD-10-CM | POA: Diagnosis not present

## 2020-03-02 DIAGNOSIS — I10 Essential (primary) hypertension: Secondary | ICD-10-CM | POA: Diagnosis not present

## 2020-03-09 DIAGNOSIS — E6609 Other obesity due to excess calories: Secondary | ICD-10-CM | POA: Diagnosis not present

## 2020-03-09 DIAGNOSIS — E663 Overweight: Secondary | ICD-10-CM | POA: Diagnosis not present

## 2020-03-09 DIAGNOSIS — F331 Major depressive disorder, recurrent, moderate: Secondary | ICD-10-CM | POA: Diagnosis not present

## 2020-03-09 DIAGNOSIS — B9629 Other Escherichia coli [E. coli] as the cause of diseases classified elsewhere: Secondary | ICD-10-CM | POA: Diagnosis not present

## 2020-03-09 DIAGNOSIS — F3281 Premenstrual dysphoric disorder: Secondary | ICD-10-CM | POA: Diagnosis not present

## 2020-03-09 DIAGNOSIS — E785 Hyperlipidemia, unspecified: Secondary | ICD-10-CM | POA: Diagnosis not present

## 2020-03-09 DIAGNOSIS — F329 Major depressive disorder, single episode, unspecified: Secondary | ICD-10-CM | POA: Diagnosis not present

## 2020-03-09 DIAGNOSIS — I10 Essential (primary) hypertension: Secondary | ICD-10-CM | POA: Diagnosis not present

## 2020-03-09 DIAGNOSIS — F39 Unspecified mood [affective] disorder: Secondary | ICD-10-CM | POA: Diagnosis not present

## 2020-03-09 DIAGNOSIS — J0100 Acute maxillary sinusitis, unspecified: Secondary | ICD-10-CM | POA: Diagnosis not present

## 2020-03-09 DIAGNOSIS — F332 Major depressive disorder, recurrent severe without psychotic features: Secondary | ICD-10-CM | POA: Diagnosis not present

## 2020-03-09 DIAGNOSIS — E782 Mixed hyperlipidemia: Secondary | ICD-10-CM | POA: Diagnosis not present

## 2020-03-09 DIAGNOSIS — L659 Nonscarring hair loss, unspecified: Secondary | ICD-10-CM | POA: Diagnosis not present

## 2020-03-09 DIAGNOSIS — F411 Generalized anxiety disorder: Secondary | ICD-10-CM | POA: Diagnosis not present

## 2020-03-09 DIAGNOSIS — R609 Edema, unspecified: Secondary | ICD-10-CM | POA: Diagnosis not present

## 2020-03-10 DIAGNOSIS — M2031 Hallux varus (acquired), right foot: Secondary | ICD-10-CM | POA: Diagnosis not present

## 2020-03-10 DIAGNOSIS — Z4889 Encounter for other specified surgical aftercare: Secondary | ICD-10-CM | POA: Diagnosis not present

## 2020-03-15 DIAGNOSIS — F332 Major depressive disorder, recurrent severe without psychotic features: Secondary | ICD-10-CM | POA: Diagnosis not present

## 2020-03-15 DIAGNOSIS — J0100 Acute maxillary sinusitis, unspecified: Secondary | ICD-10-CM | POA: Diagnosis not present

## 2020-03-15 DIAGNOSIS — E663 Overweight: Secondary | ICD-10-CM | POA: Diagnosis not present

## 2020-03-15 DIAGNOSIS — E6609 Other obesity due to excess calories: Secondary | ICD-10-CM | POA: Diagnosis not present

## 2020-03-15 DIAGNOSIS — I10 Essential (primary) hypertension: Secondary | ICD-10-CM | POA: Diagnosis not present

## 2020-03-15 DIAGNOSIS — F39 Unspecified mood [affective] disorder: Secondary | ICD-10-CM | POA: Diagnosis not present

## 2020-03-15 DIAGNOSIS — E785 Hyperlipidemia, unspecified: Secondary | ICD-10-CM | POA: Diagnosis not present

## 2020-03-15 DIAGNOSIS — F411 Generalized anxiety disorder: Secondary | ICD-10-CM | POA: Diagnosis not present

## 2020-03-15 DIAGNOSIS — F331 Major depressive disorder, recurrent, moderate: Secondary | ICD-10-CM | POA: Diagnosis not present

## 2020-03-15 DIAGNOSIS — E782 Mixed hyperlipidemia: Secondary | ICD-10-CM | POA: Diagnosis not present

## 2020-03-15 DIAGNOSIS — F329 Major depressive disorder, single episode, unspecified: Secondary | ICD-10-CM | POA: Diagnosis not present

## 2020-03-15 DIAGNOSIS — B9629 Other Escherichia coli [E. coli] as the cause of diseases classified elsewhere: Secondary | ICD-10-CM | POA: Diagnosis not present

## 2020-03-17 DIAGNOSIS — E782 Mixed hyperlipidemia: Secondary | ICD-10-CM | POA: Diagnosis not present

## 2020-03-17 DIAGNOSIS — F331 Major depressive disorder, recurrent, moderate: Secondary | ICD-10-CM | POA: Diagnosis not present

## 2020-03-17 DIAGNOSIS — K5900 Constipation, unspecified: Secondary | ICD-10-CM | POA: Diagnosis not present

## 2020-03-17 DIAGNOSIS — F5101 Primary insomnia: Secondary | ICD-10-CM | POA: Diagnosis not present

## 2020-03-17 DIAGNOSIS — I10 Essential (primary) hypertension: Secondary | ICD-10-CM | POA: Diagnosis not present

## 2020-03-17 DIAGNOSIS — R609 Edema, unspecified: Secondary | ICD-10-CM | POA: Diagnosis not present

## 2020-03-17 DIAGNOSIS — R5383 Other fatigue: Secondary | ICD-10-CM | POA: Diagnosis not present

## 2020-03-17 DIAGNOSIS — S39012A Strain of muscle, fascia and tendon of lower back, initial encounter: Secondary | ICD-10-CM | POA: Diagnosis not present

## 2020-03-17 DIAGNOSIS — Z0001 Encounter for general adult medical examination with abnormal findings: Secondary | ICD-10-CM | POA: Diagnosis not present

## 2020-03-17 DIAGNOSIS — K219 Gastro-esophageal reflux disease without esophagitis: Secondary | ICD-10-CM | POA: Diagnosis not present

## 2020-03-17 DIAGNOSIS — F900 Attention-deficit hyperactivity disorder, predominantly inattentive type: Secondary | ICD-10-CM | POA: Diagnosis not present

## 2020-03-17 DIAGNOSIS — H33001 Unspecified retinal detachment with retinal break, right eye: Secondary | ICD-10-CM | POA: Diagnosis not present

## 2020-03-24 DIAGNOSIS — E782 Mixed hyperlipidemia: Secondary | ICD-10-CM | POA: Diagnosis not present

## 2020-03-24 DIAGNOSIS — F411 Generalized anxiety disorder: Secondary | ICD-10-CM | POA: Diagnosis not present

## 2020-03-24 DIAGNOSIS — E785 Hyperlipidemia, unspecified: Secondary | ICD-10-CM | POA: Diagnosis not present

## 2020-03-24 DIAGNOSIS — N390 Urinary tract infection, site not specified: Secondary | ICD-10-CM | POA: Diagnosis not present

## 2020-03-24 DIAGNOSIS — E6609 Other obesity due to excess calories: Secondary | ICD-10-CM | POA: Diagnosis not present

## 2020-03-24 DIAGNOSIS — J0100 Acute maxillary sinusitis, unspecified: Secondary | ICD-10-CM | POA: Diagnosis not present

## 2020-03-24 DIAGNOSIS — I10 Essential (primary) hypertension: Secondary | ICD-10-CM | POA: Diagnosis not present

## 2020-03-24 DIAGNOSIS — F329 Major depressive disorder, single episode, unspecified: Secondary | ICD-10-CM | POA: Diagnosis not present

## 2020-03-24 DIAGNOSIS — F331 Major depressive disorder, recurrent, moderate: Secondary | ICD-10-CM | POA: Diagnosis not present

## 2020-03-24 DIAGNOSIS — B9629 Other Escherichia coli [E. coli] as the cause of diseases classified elsewhere: Secondary | ICD-10-CM | POA: Diagnosis not present

## 2020-03-24 DIAGNOSIS — E663 Overweight: Secondary | ICD-10-CM | POA: Diagnosis not present

## 2020-03-24 DIAGNOSIS — F39 Unspecified mood [affective] disorder: Secondary | ICD-10-CM | POA: Diagnosis not present

## 2020-03-24 DIAGNOSIS — F332 Major depressive disorder, recurrent severe without psychotic features: Secondary | ICD-10-CM | POA: Diagnosis not present

## 2020-03-28 ENCOUNTER — Telehealth: Payer: Self-pay | Admitting: Gastroenterology

## 2020-03-28 DIAGNOSIS — M2031 Hallux varus (acquired), right foot: Secondary | ICD-10-CM | POA: Diagnosis not present

## 2020-03-28 DIAGNOSIS — Z4889 Encounter for other specified surgical aftercare: Secondary | ICD-10-CM | POA: Diagnosis not present

## 2020-03-28 NOTE — Telephone Encounter (Signed)
Pt called to say she was out of her LInzess and doesn't know if AB wanted her to take 145mg  or 290mg . Please advise. She uses [facility]. [phone]

## 2020-03-29 NOTE — Telephone Encounter (Signed)
Pt stated when she was taking 110mcg she was taking pain medication. It was after her foot surgery. She states 259mcg kind of gives her diarrhea so she is not sure if she should decrease it. Pt asked if we have samples maybe she could try to see if she can go back on linzess 151mcg or 279mcg. Notified her that we do but will wait to see what the provider suggest on 6/3 and let her know

## 2020-03-29 NOTE — Telephone Encounter (Signed)
Looks like when Sara Barnett saw her in January, she was having constipation at that time on 145 mcg  and was not on any pain medications. Linzess was increased to 290 mcg. If she feels like 290 mcg is too strong, we can go back to 145 mcg to see how she does. Looks like we do have samples if she wants to come pick them up.

## 2020-03-29 NOTE — Telephone Encounter (Signed)
Left samples at front desk 1104mcg pt notified

## 2020-04-10 NOTE — Telephone Encounter (Signed)
Pt called to let AB know that the Linzess 145 mcg samples are working well. Pt would like an RX sent to her pharamcy.

## 2020-04-11 MED ORDER — LINACLOTIDE 145 MCG PO CAPS: 145.0000 ug | ORAL_CAPSULE | Freq: Every day | ORAL | 3 refills | Status: DC

## 2020-04-11 NOTE — Addendum Note (Signed)
Addended by: Annitta Needs on: 04/11/2020 12:43 PM   Modules accepted: Orders

## 2020-04-11 NOTE — Telephone Encounter (Signed)
completed

## 2020-04-14 DIAGNOSIS — Z4889 Encounter for other specified surgical aftercare: Secondary | ICD-10-CM | POA: Diagnosis not present

## 2020-04-19 ENCOUNTER — Encounter: Payer: Self-pay | Admitting: Cardiology

## 2020-04-19 ENCOUNTER — Telehealth: Payer: Self-pay | Admitting: Cardiology

## 2020-04-19 ENCOUNTER — Other Ambulatory Visit: Payer: Self-pay

## 2020-04-19 ENCOUNTER — Ambulatory Visit: Payer: PPO | Admitting: Cardiology

## 2020-04-19 VITALS — BP 122/82 | HR 81 | Ht 63.0 in | Wt 183.4 lb

## 2020-04-19 DIAGNOSIS — I10 Essential (primary) hypertension: Secondary | ICD-10-CM

## 2020-04-19 DIAGNOSIS — M7989 Other specified soft tissue disorders: Secondary | ICD-10-CM | POA: Diagnosis not present

## 2020-04-19 DIAGNOSIS — R0602 Shortness of breath: Secondary | ICD-10-CM

## 2020-04-19 NOTE — Patient Instructions (Addendum)
Medication Instructions:   Your physician recommends that you continue on your current medications as directed. Please refer to the Current Medication list given to you today.  Labwork:  NONE  Testing/Procedures: Your physician has requested that you have an echocardiogram. Echocardiography is a painless test that uses sound waves to create images of your heart. It provides your doctor with information about the size and shape of your heart and how well your heart's chambers and valves are working. This procedure takes approximately one hour. There are no restrictions for this procedure.  Follow-Up:  Your physician recommends that you schedule a follow-up appointment in: pending.  Any Other Special Instructions Will Be Listed Below (If Applicable).  If you need a refill on your cardiac medications before your next appointment, please call your pharmacy. 

## 2020-04-19 NOTE — Telephone Encounter (Signed)
Pre-cert Verification for the following procedure    ECHO  DATE: 05/11/2020   LOCATION: [place] OFFICE

## 2020-04-19 NOTE — Progress Notes (Signed)
Cardiology Office Note  Date: 04/19/2020   ID: Sara Barnett, DOB [birth year], MRN [id number]  PCP:  Celene Squibb, MD  Cardiologist:  Rozann Lesches, MD Electrophysiologist:  None   Chief Complaint  Patient presents with  . Foot and ankle swelling    History of Present Illness: Sara Barnett is a 54 y.o. female referred for cardiology consultation by Dr. Nevada Crane for the evaluation of edema.  I reviewed available records.  She states that she has had relatively mild foot and ankle swelling, noticed this was worse after having right foot surgery, mainly swelling on the top of her foot at that time.  She does not describe any orthopnea or PND.  She describes herself as intermittently but chronically a "low energy" person, at times feels better when she uses 5-hour energy.  She states that she has also gained weight.  Thyroid studies per PCP reportedly normal.  I reviewed her medications which are outlined below.  Her blood pressure is well controlled today on combination of Norvasc and Lotensin.  She is also on low-dose Lasix.  We did talk about possibility of leg edema in association with side effect of Norvasc.  Chart review indicates history of heart murmur.  Patient states that she was diagnosed with mitral valve prolapse several years ago.  She has not had a recent echocardiogram.  I did review her recent ECG.  Past Medical History:  Diagnosis Date  . ADHD   . Anxiety   . Arthritis   . Chronic ethmoidal sinusitis 07/2017  . Chronic maxillary sinusitis 07/2017  . Complication of anesthesia    Anaphylaxis after Methylprednisolone injection - cardiac arrest/PEA  . 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 Depression   . Deviated nasal septum 07/2017  . Essential hypertension   . GERD (gastroesophageal reflux disease)   . History of cardiac murmur   . Legally blind   . Restless leg     Past Surgical History:  Procedure Laterality Date  . ABLATION ON ENDOMETRIOSIS      . BUNIONECTOMY Right   . BUNIONECTOMY Right 02/09/2020   Procedure: BUNIONECTOMY RIGHT FOOT WITH SESAMOIDECTOMY, LENGTHENING OF RIGHT EXTENSOR HALLUCIS LONGUS TENDON, PLANTAR FASCIA RELEASE OF RIGHT FOOT;  Surgeon: Caprice Beaver, DPM;  Location: [facility];  Service: Podiatry;  Laterality: Right;  . CATARACT EXTRACTION, BILATERAL     age 5 year  . CESAREAN SECTION     x 2  . COLONOSCOPY WITH PROPOFOL N/A 08/25/2018   moderate pancolonic diverticulosis, external and internal hemorrhoids, redundant left colon.   . ENDOSCOPIC CONCHA BULLOSA RESECTION Bilateral 08/25/2017   Procedure: BILATERAL ENDOSCOPIC CONCHA BULLOSA RESECTION;  Surgeon: Leta Baptist, MD;  Location: [place];  Service: ENT;  Laterality: Bilateral;  . ETHMOIDECTOMY Bilateral 08/25/2017   Procedure: BILATERAL TOTAL ETHMOIDECTOMY;  Surgeon: Leta Baptist, MD;  Location: [place];  Service: ENT;  Laterality: Bilateral;  . EYE SURGERY    . INTRAOCULAR LENS INSERTION Bilateral    age 54s  . LUMBAR FUSION  07/05/2004  . LUMBAR LAMINECTOMY  07/05/2004   L5-S1  . MAXILLARY ANTROSTOMY Bilateral 08/25/2017   Procedure: BILATERAL MAXILLARY ANTROSTOMY WITH TISSUE REMOVAL;  Surgeon: Leta Baptist, MD;  Location: [place];  Service: ENT;  Laterality: Bilateral;  . MEMBRANE PEEL Right 11/06/2011; 08/06/2013  . NASAL SEPTOPLASTY W/ TURBINOPLASTY Bilateral 08/25/2017   Procedure: NASAL SEPTOPLASTY WITH BILATERAL TURBINATE REDUCTION;  Surgeon: Leta Baptist, MD;  Location: [facility];  Service: ENT;  Laterality: Bilateral;  . PARS PLANA VITRECTOMY Right 03/22/2011; 08/21/2011; 12/20/2011; 05/13/2012; 11/20/2012; 05/12/2013; 08/06/2013; 05/17/2016  . PARS PLANA VITRECTOMY W/ SCLERAL BUCKLE Left 04/21/2015  . PLANTAR FASCIA RELEASE Right   . SINUS ENDO W/FUSION Bilateral 08/25/2017   Procedure: ENDOSCOPIC SINUS SURGERY WITH FUSION NAVIGATION;  Surgeon: Leta Baptist, MD;  Location: [place];   Service: ENT;  Laterality: Bilateral;  . TUBAL LIGATION      Current Outpatient Medications  Medication Sig Dispense Refill  . amLODipine (NORVASC) 10 MG tablet Take 10 mg by mouth daily.    . benazepril (LOTENSIN) 40 MG tablet Take 40 mg by mouth daily.    Marland Kitchen buPROPion (WELLBUTRIN XL) 300 MG 24 hr tablet Take 1 tablet (300 mg total) by mouth daily. 30 tablet 0  . clonazePAM (KLONOPIN) 0.5 MG tablet Take 0.5 mg by mouth at bedtime as needed (To help with restless mind).     . furosemide (LASIX) 20 MG tablet Take 20 mg by mouth in the morning.    . lansoprazole (PREVACID) 30 MG capsule Take 30 mg by mouth daily.     Marland Kitchen linaclotide (LINZESS) 145 MCG CAPS capsule Take 1 capsule (145 mcg total) by mouth daily before breakfast. 90 capsule 3  . methocarbamol (ROBAXIN) 500 MG tablet Take 500 mg by mouth as needed.    . pramipexole (MIRAPEX) 0.125 MG tablet TAKE 3 TABLETS BY MOUTH ONCE DAILY IN THE EVENING 90 TO 120 MINUTES BEFORE BEDTIME (Patient taking differently: Take 0.375 mg by mouth at bedtime. 90 TO 120 MINUTES BEFORE BEDTIME) 270 tablet 1  . prednisoLONE acetate (PRED FORTE) 1 % ophthalmic suspension Place 1 drop into the right eye in the morning and at bedtime.      No current facility-administered medications for this visit.   Allergies:  Doxycycline, Hydrocodone-acetaminophen, Methylprednisolone, Other, Augmentin [amoxicillin-pot clavulanate], and Statins   Social History: The patient  reports that she quit smoking about 10 years ago. Her smoking use included cigarettes. She has a 45.00 pack-year smoking history. She has never used smokeless tobacco. She reports current alcohol use. She reports that she does not use drugs.   Family History: The patient's family history includes Depression in her mother; Hypertension in her father; Kidney failure in her father; Prostate cancer in her father.   ROS:   No palpitations or syncope.  Physical Exam: VS:  BP 122/82   Pulse 81   Ht 5\' 3"  (1.6  m)   Wt 183 lb 6.4 oz (83.2 kg)   SpO2 96%   BMI 32.49 kg/m , BMI Body mass index is 32.49 kg/m.  Wt Readings from Last 3 Encounters:  04/19/20 183 lb 6.4 oz (83.2 kg)  02/07/20 177 lb (80.3 kg)  11/02/19 167 lb 12.8 oz (76.1 kg)    General: Patient appears comfortable at rest. HEENT: Conjunctiva and lids normal, wearing a mask. Neck: Supple, no elevated JVP or carotid bruits, no thyromegaly. Lungs: Clear to auscultation, nonlabored breathing at rest. Cardiac: Regular rate and rhythm, no S3, soft systolic murmur, no pericardial rub. Abdomen: Soft, bowel sounds present. Extremities: Trace ankle edema, distal pulses 2+. Skin: Warm and dry. Musculoskeletal: No kyphosis. Neuropsychiatric: Alert and oriented x3, affect grossly appropriate.  ECG:  An ECG dated 03/02/2020 was personally reviewed today and demonstrated:  Sinus rhythm with decreased R wave progression.  Recent Labwork: 04/26/2019: Magnesium 2.0 05/01/2019: ALT 14; AST 13; BUN 8; Creatinine, Ser 0.77; Hemoglobin 14.0; Platelets 264;  Potassium 3.9; Sodium 137   Other Studies Reviewed Today:  Cardiac testing for review today.  Assessment and Plan:  1.  Mild foot and ankle edema, likely multifactorial.  She is on Norvasc which could be contributing.  Also had right foot surgery and noticed symptoms after this.  She has no history of cardiomyopathy, but does report a prior history of mitral valve prolapse.  We will obtain an echocardiogram to ensure normal cardiac structure and function.  States that thyroid studies per PCP were normal.  2.  Essential hypertension, currently on Norvasc and Lotensin.  Blood pressure is normal today.  Medication Adjustments/Labs and Tests Ordered: Current medicines are reviewed at length with the patient today.  Concerns regarding medicines are outlined above.   Tests Ordered: Orders Placed This Encounter  Procedures  . ECHOCARDIOGRAM COMPLETE    Medication Changes: No orders of the  defined types were placed in this encounter.   Disposition:  Follow up test results.  Signed, Satira Sark, MD, [facility] 04/19/2020 11:32 AM    [facility] [place], [place], [place] [address] Phone: [phone]; Fax: [phone]

## 2020-05-11 ENCOUNTER — Ambulatory Visit (INDEPENDENT_AMBULATORY_CARE_PROVIDER_SITE_OTHER): Payer: PPO

## 2020-05-11 DIAGNOSIS — R0602 Shortness of breath: Secondary | ICD-10-CM | POA: Diagnosis not present

## 2020-05-11 DIAGNOSIS — M7989 Other specified soft tissue disorders: Secondary | ICD-10-CM | POA: Diagnosis not present

## 2020-05-11 LAB — ECHOCARDIOGRAM COMPLETE
Area-P 1/2: 2.76 cm2
Calc EF: 57.2 %
MV M vel: 3.12 m/s
MV Peak grad: 39 mmHg
S' Lateral: 2.89 cm
Single Plane A2C EF: 52.1 %
Single Plane A4C EF: 60.9 %

## 2020-05-12 ENCOUNTER — Telehealth: Payer: Self-pay | Admitting: *Deleted

## 2020-05-12 DIAGNOSIS — M79671 Pain in right foot: Secondary | ICD-10-CM | POA: Diagnosis not present

## 2020-05-12 NOTE — Telephone Encounter (Signed)
-----   Message from Sara Barnett, [place] sent at 05/12/2020  7:39 AM EDT ----- Patient of Dr. Domenic Polite - Please let the patient know her echocardiogram showed normal pumping function of the heart with a preserved EF of 60 to 65%.  No regional wall motion normalities. She did not have any significant valve abnormalities. Overall, a reassuring study. Please forward a copy of results to Celene Squibb, MD.

## 2020-05-12 NOTE — Telephone Encounter (Signed)
Patient informed. Copy sent to PCP °

## 2020-06-07 DIAGNOSIS — T8484XA Pain due to internal orthopedic prosthetic devices, implants and grafts, initial encounter: Secondary | ICD-10-CM | POA: Diagnosis not present

## 2020-06-07 DIAGNOSIS — M79671 Pain in right foot: Secondary | ICD-10-CM | POA: Diagnosis not present

## 2020-06-07 DIAGNOSIS — M2031 Hallux varus (acquired), right foot: Secondary | ICD-10-CM | POA: Diagnosis not present

## 2020-06-08 ENCOUNTER — Other Ambulatory Visit: Payer: Self-pay | Admitting: Orthopedic Surgery

## 2020-06-08 DIAGNOSIS — M2031 Hallux varus (acquired), right foot: Secondary | ICD-10-CM

## 2020-06-17 DIAGNOSIS — J019 Acute sinusitis, unspecified: Secondary | ICD-10-CM | POA: Diagnosis not present

## 2020-06-17 DIAGNOSIS — R600 Localized edema: Secondary | ICD-10-CM | POA: Diagnosis not present

## 2020-06-19 ENCOUNTER — Ambulatory Visit
Admission: RE | Admit: 2020-06-19 | Discharge: 2020-06-19 | Disposition: A | Discharge status: Home / Self Care | Payer: PPO | Source: Ambulatory Visit | Attending: Orthopedic Surgery | Admitting: Orthopedic Surgery

## 2020-06-19 ENCOUNTER — Other Ambulatory Visit: Payer: Self-pay

## 2020-06-19 DIAGNOSIS — M7989 Other specified soft tissue disorders: Secondary | ICD-10-CM | POA: Diagnosis not present

## 2020-06-19 DIAGNOSIS — M2031 Hallux varus (acquired), right foot: Secondary | ICD-10-CM

## 2020-06-19 DIAGNOSIS — M79671 Pain in right foot: Secondary | ICD-10-CM | POA: Diagnosis not present

## 2020-06-22 DIAGNOSIS — H3521 Other non-diabetic proliferative retinopathy, right eye: Secondary | ICD-10-CM | POA: Diagnosis not present

## 2020-06-22 DIAGNOSIS — Z8669 Personal history of other diseases of the nervous system and sense organs: Secondary | ICD-10-CM | POA: Diagnosis not present

## 2020-06-22 DIAGNOSIS — H15011 Anterior scleritis, right eye: Secondary | ICD-10-CM | POA: Diagnosis not present

## 2020-06-22 DIAGNOSIS — H33021 Retinal detachment with multiple breaks, right eye: Secondary | ICD-10-CM | POA: Diagnosis not present

## 2020-06-26 DIAGNOSIS — T8484XA Pain due to internal orthopedic prosthetic devices, implants and grafts, initial encounter: Secondary | ICD-10-CM | POA: Diagnosis not present

## 2020-06-26 DIAGNOSIS — T849XXA Unspecified complication of internal orthopedic prosthetic device, implant and graft, initial encounter: Secondary | ICD-10-CM | POA: Insufficient documentation

## 2020-06-26 DIAGNOSIS — S92314K Nondisplaced fracture of first metatarsal bone, right foot, subsequent encounter for fracture with nonunion: Secondary | ICD-10-CM | POA: Diagnosis not present

## 2020-06-26 DIAGNOSIS — S92309A Fracture of unspecified metatarsal bone(s), unspecified foot, initial encounter for closed fracture: Secondary | ICD-10-CM | POA: Insufficient documentation

## 2020-06-28 ENCOUNTER — Other Ambulatory Visit (HOSPITAL_COMMUNITY): Payer: Self-pay | Admitting: Orthopedic Surgery

## 2020-06-29 ENCOUNTER — Encounter (HOSPITAL_BASED_OUTPATIENT_CLINIC_OR_DEPARTMENT_OTHER): Payer: Self-pay | Admitting: Orthopedic Surgery

## 2020-06-29 ENCOUNTER — Other Ambulatory Visit: Payer: Self-pay

## 2020-07-04 ENCOUNTER — Other Ambulatory Visit (HOSPITAL_COMMUNITY)
Admission: RE | Admit: 2020-07-04 | Discharge: 2020-07-04 | Disposition: A | Discharge status: Home / Self Care | Payer: PPO | Source: Ambulatory Visit | Attending: Orthopedic Surgery | Admitting: Orthopedic Surgery

## 2020-07-04 ENCOUNTER — Encounter (HOSPITAL_BASED_OUTPATIENT_CLINIC_OR_DEPARTMENT_OTHER)
Admission: RE | Admit: 2020-07-04 | Discharge: 2020-07-04 | Disposition: A | Discharge status: Home / Self Care | Payer: PPO | Source: Ambulatory Visit | Attending: Orthopedic Surgery | Admitting: Orthopedic Surgery

## 2020-07-04 DIAGNOSIS — Z87891 Personal history of nicotine dependence: Secondary | ICD-10-CM | POA: Diagnosis not present

## 2020-07-04 DIAGNOSIS — Z01812 Encounter for preprocedural laboratory examination: Secondary | ICD-10-CM | POA: Insufficient documentation

## 2020-07-04 DIAGNOSIS — Z8674 Personal history of sudden cardiac arrest: Secondary | ICD-10-CM | POA: Diagnosis not present

## 2020-07-04 DIAGNOSIS — K219 Gastro-esophageal reflux disease without esophagitis: Secondary | ICD-10-CM | POA: Diagnosis not present

## 2020-07-04 DIAGNOSIS — Z880 Allergy status to penicillin: Secondary | ICD-10-CM | POA: Diagnosis not present

## 2020-07-04 DIAGNOSIS — Z79899 Other long term (current) drug therapy: Secondary | ICD-10-CM | POA: Diagnosis not present

## 2020-07-04 DIAGNOSIS — Z20822 Contact with and (suspected) exposure to covid-19: Secondary | ICD-10-CM | POA: Diagnosis not present

## 2020-07-04 DIAGNOSIS — Y839 Surgical procedure, unspecified as the cause of abnormal reaction of the patient, or of later complication, without mention of misadventure at the time of the procedure: Secondary | ICD-10-CM | POA: Diagnosis not present

## 2020-07-04 DIAGNOSIS — T8484XA Pain due to internal orthopedic prosthetic devices, implants and grafts, initial encounter: Secondary | ICD-10-CM | POA: Diagnosis not present

## 2020-07-04 DIAGNOSIS — M1990 Unspecified osteoarthritis, unspecified site: Secondary | ICD-10-CM | POA: Diagnosis not present

## 2020-07-04 DIAGNOSIS — G2581 Restless legs syndrome: Secondary | ICD-10-CM | POA: Diagnosis not present

## 2020-07-04 DIAGNOSIS — F329 Major depressive disorder, single episode, unspecified: Secondary | ICD-10-CM | POA: Diagnosis not present

## 2020-07-04 DIAGNOSIS — M960 Pseudarthrosis after fusion or arthrodesis: Secondary | ICD-10-CM | POA: Diagnosis not present

## 2020-07-04 DIAGNOSIS — R600 Localized edema: Secondary | ICD-10-CM | POA: Diagnosis not present

## 2020-07-04 DIAGNOSIS — Z888 Allergy status to other drugs, medicaments and biological substances status: Secondary | ICD-10-CM | POA: Diagnosis not present

## 2020-07-04 DIAGNOSIS — Z885 Allergy status to narcotic agent status: Secondary | ICD-10-CM | POA: Diagnosis not present

## 2020-07-04 DIAGNOSIS — Z8249 Family history of ischemic heart disease and other diseases of the circulatory system: Secondary | ICD-10-CM | POA: Diagnosis not present

## 2020-07-04 DIAGNOSIS — Z87892 Personal history of anaphylaxis: Secondary | ICD-10-CM | POA: Diagnosis not present

## 2020-07-04 DIAGNOSIS — I10 Essential (primary) hypertension: Secondary | ICD-10-CM | POA: Diagnosis not present

## 2020-07-04 DIAGNOSIS — M2031 Hallux varus (acquired), right foot: Secondary | ICD-10-CM | POA: Diagnosis not present

## 2020-07-04 DIAGNOSIS — Z8719 Personal history of other diseases of the digestive system: Secondary | ICD-10-CM | POA: Diagnosis not present

## 2020-07-04 DIAGNOSIS — Z881 Allergy status to other antibiotic agents status: Secondary | ICD-10-CM | POA: Diagnosis not present

## 2020-07-04 DIAGNOSIS — F419 Anxiety disorder, unspecified: Secondary | ICD-10-CM | POA: Diagnosis not present

## 2020-07-04 DIAGNOSIS — R350 Frequency of micturition: Secondary | ICD-10-CM | POA: Diagnosis not present

## 2020-07-04 LAB — BASIC METABOLIC PANEL
Anion gap: 8 (ref 5–15)
BUN: 7 mg/dL (ref 6–20)
CO2: 24 mmol/L (ref 22–32)
Calcium: 9 mg/dL (ref 8.9–10.3)
Chloride: 104 mmol/L (ref 98–111)
Creatinine, Ser: 0.8 mg/dL (ref 0.44–1.00)
GFR calc Af Amer: >60 mL/min (ref >60)
GFR calc non Af Amer: >60 mL/min (ref >60)
Glucose, Bld: 88 mg/dL (ref 70–99)
Potassium: 4.5 mmol/L (ref 3.5–5.1)
Sodium: 136 mmol/L (ref 135–145)

## 2020-07-04 NOTE — Progress Notes (Signed)

## 2020-07-05 LAB — SARS CORONAVIRUS 2 (TAT 6-24 HRS): SARS Coronavirus 2: NEGATIVE

## 2020-07-05 NOTE — Anesthesia Preprocedure Evaluation (Addendum)
Anesthesia Evaluation  Patient identified by MRN, date of birth, ID band Patient awake    Reviewed: Allergy & Precautions, NPO status , Patient's Chart, lab work & pertinent test results  History of Anesthesia Complications Negative for: history of anesthetic complications (allergic steroids)  Airway Mallampati: II  TM Distance: >3 FB Neck ROM: Full    Dental no notable dental hx. (+) Dental Advisory Given   Pulmonary neg pulmonary ROS, former smoker,    Pulmonary exam normal        Cardiovascular hypertension, Pt. on medications Normal cardiovascular examI  IMPRESSIONS    1. Left ventricular ejection fraction, by estimation, is 60 to 65%. The  left ventricle has normal function. The left ventricle has no regional  wall motion abnormalities. Left ventricular diastolic parameters were  normal.  2. Right ventricular systolic function is normal. The right ventricular  size is normal.  3. The mitral valve is normal in structure. Trivial mitral valve  regurgitation. No evidence of mitral stenosis.  4. The aortic valve is tricuspid. Aortic valve regurgitation is not  visualized. No aortic stenosis is present.    Neuro/Psych PSYCHIATRIC DISORDERS Anxiety Depression negative neurological ROS     GI/Hepatic GERD  Medicated and Controlled,(+)     substance abuse  alcohol use,   Endo/Other  negative endocrine ROS  Renal/GU negative Renal ROS  negative genitourinary   Musculoskeletal  (+) Arthritis , Osteoarthritis,    Abdominal   Peds negative pediatric ROS (+)  Hematology negative hematology ROS (+)   Anesthesia Other Findings   Reproductive/Obstetrics negative OB ROS                            Anesthesia Physical  Anesthesia Plan  ASA: II  Anesthesia Plan: General   Post-op Pain Management:  Regional for Post-op pain   Induction: Intravenous  PONV Risk Score and Plan: 4 or  greater and Ondansetron, Midazolam and Scopolamine patch - Pre-op  Airway Management Planned: LMA  Additional Equipment:   Intra-op Plan:   Post-operative Plan: Extubation in OR  Informed Consent: I have reviewed the patients History and Physical, chart, labs and discussed the procedure including the risks, benefits and alternatives for the proposed anesthesia with the patient or authorized representative who has indicated his/her understanding and acceptance.     Dental advisory given  Plan Discussed with: CRNA and Anesthesiologist  Anesthesia Plan Comments:        Anesthesia Quick Evaluation

## 2020-07-06 ENCOUNTER — Encounter (HOSPITAL_BASED_OUTPATIENT_CLINIC_OR_DEPARTMENT_OTHER)
Admission: RE | Disposition: A | Discharge status: Home / Self Care | Payer: Self-pay | Source: Home / Self Care | Attending: Orthopedic Surgery

## 2020-07-06 ENCOUNTER — Other Ambulatory Visit: Payer: Self-pay

## 2020-07-06 ENCOUNTER — Ambulatory Visit (HOSPITAL_BASED_OUTPATIENT_CLINIC_OR_DEPARTMENT_OTHER): Payer: PPO | Admitting: Anesthesiology

## 2020-07-06 ENCOUNTER — Ambulatory Visit (HOSPITAL_BASED_OUTPATIENT_CLINIC_OR_DEPARTMENT_OTHER)
Admission: RE | Admit: 2020-07-06 | Discharge: 2020-07-06 | Disposition: A | Discharge status: Home / Self Care | Payer: PPO | Attending: Orthopedic Surgery | Admitting: Orthopedic Surgery

## 2020-07-06 ENCOUNTER — Encounter (HOSPITAL_BASED_OUTPATIENT_CLINIC_OR_DEPARTMENT_OTHER): Payer: Self-pay | Admitting: Orthopedic Surgery

## 2020-07-06 DIAGNOSIS — Z8674 Personal history of sudden cardiac arrest: Secondary | ICD-10-CM | POA: Diagnosis not present

## 2020-07-06 DIAGNOSIS — Y839 Surgical procedure, unspecified as the cause of abnormal reaction of the patient, or of later complication, without mention of misadventure at the time of the procedure: Secondary | ICD-10-CM | POA: Insufficient documentation

## 2020-07-06 DIAGNOSIS — Z8719 Personal history of other diseases of the digestive system: Secondary | ICD-10-CM | POA: Insufficient documentation

## 2020-07-06 DIAGNOSIS — K219 Gastro-esophageal reflux disease without esophagitis: Secondary | ICD-10-CM | POA: Insufficient documentation

## 2020-07-06 DIAGNOSIS — M960 Pseudarthrosis after fusion or arthrodesis: Secondary | ICD-10-CM | POA: Diagnosis not present

## 2020-07-06 DIAGNOSIS — Z87892 Personal history of anaphylaxis: Secondary | ICD-10-CM | POA: Insufficient documentation

## 2020-07-06 DIAGNOSIS — Z881 Allergy status to other antibiotic agents status: Secondary | ICD-10-CM | POA: Insufficient documentation

## 2020-07-06 DIAGNOSIS — M2031 Hallux varus (acquired), right foot: Secondary | ICD-10-CM | POA: Insufficient documentation

## 2020-07-06 DIAGNOSIS — K5900 Constipation, unspecified: Secondary | ICD-10-CM | POA: Diagnosis not present

## 2020-07-06 DIAGNOSIS — Z79899 Other long term (current) drug therapy: Secondary | ICD-10-CM | POA: Insufficient documentation

## 2020-07-06 DIAGNOSIS — G2581 Restless legs syndrome: Secondary | ICD-10-CM | POA: Diagnosis not present

## 2020-07-06 DIAGNOSIS — S92314K Nondisplaced fracture of first metatarsal bone, right foot, subsequent encounter for fracture with nonunion: Secondary | ICD-10-CM | POA: Diagnosis not present

## 2020-07-06 DIAGNOSIS — I10 Essential (primary) hypertension: Secondary | ICD-10-CM | POA: Diagnosis not present

## 2020-07-06 DIAGNOSIS — Z87891 Personal history of nicotine dependence: Secondary | ICD-10-CM | POA: Diagnosis not present

## 2020-07-06 DIAGNOSIS — Z885 Allergy status to narcotic agent status: Secondary | ICD-10-CM | POA: Insufficient documentation

## 2020-07-06 DIAGNOSIS — F419 Anxiety disorder, unspecified: Secondary | ICD-10-CM | POA: Insufficient documentation

## 2020-07-06 DIAGNOSIS — T8484XA Pain due to internal orthopedic prosthetic devices, implants and grafts, initial encounter: Secondary | ICD-10-CM | POA: Insufficient documentation

## 2020-07-06 DIAGNOSIS — M1990 Unspecified osteoarthritis, unspecified site: Secondary | ICD-10-CM | POA: Insufficient documentation

## 2020-07-06 DIAGNOSIS — F329 Major depressive disorder, single episode, unspecified: Secondary | ICD-10-CM | POA: Insufficient documentation

## 2020-07-06 DIAGNOSIS — Z888 Allergy status to other drugs, medicaments and biological substances status: Secondary | ICD-10-CM | POA: Insufficient documentation

## 2020-07-06 DIAGNOSIS — Z8249 Family history of ischemic heart disease and other diseases of the circulatory system: Secondary | ICD-10-CM | POA: Insufficient documentation

## 2020-07-06 DIAGNOSIS — Z880 Allergy status to penicillin: Secondary | ICD-10-CM | POA: Insufficient documentation

## 2020-07-06 DIAGNOSIS — G8918 Other acute postprocedural pain: Secondary | ICD-10-CM | POA: Diagnosis not present

## 2020-07-06 HISTORY — PX: ORIF TOE FRACTURE: SHX5032

## 2020-07-06 HISTORY — PX: HARDWARE REMOVAL: SHX979

## 2020-07-06 LAB — POCT PREGNANCY, URINE: Preg Test, Ur: NEGATIVE

## 2020-07-06 SURGERY — REMOVAL, HARDWARE
Anesthesia: General | Site: Foot | Laterality: Right

## 2020-07-06 MED ORDER — LIDOCAINE HCL (CARDIAC) PF 100 MG/5ML IV SOSY
PREFILLED_SYRINGE | INTRAVENOUS | Status: DC | PRN
  Administered 2020-07-06: 100 mg via INTRAVENOUS

## 2020-07-06 MED ORDER — CELECOXIB 200 MG PO CAPS
ORAL_CAPSULE | ORAL | Status: AC
  Filled 2020-07-06: qty 1

## 2020-07-06 MED ORDER — ACETAMINOPHEN 500 MG PO TABS
ORAL_TABLET | ORAL | Status: AC
  Filled 2020-07-06: qty 2

## 2020-07-06 MED ORDER — BUPIVACAINE HCL (PF) 0.5 % IJ SOLN
INTRAMUSCULAR | Status: DC | PRN
  Administered 2020-07-06 (×2): 15 mL via PERINEURAL

## 2020-07-06 MED ORDER — EPHEDRINE SULFATE 50 MG/ML IJ SOLN
INTRAMUSCULAR | Status: DC | PRN
  Administered 2020-07-06 (×2): 10 mg via INTRAVENOUS

## 2020-07-06 MED ORDER — ASPIRIN EC 81 MG PO TBEC: 81.0000 mg | DELAYED_RELEASE_TABLET | Freq: Two times a day (BID) | ORAL | 0 refills | Status: DC

## 2020-07-06 MED ORDER — ONDANSETRON HCL 4 MG/2ML IJ SOLN
INTRAMUSCULAR | Status: DC | PRN
  Administered 2020-07-06: 4 mg via INTRAVENOUS

## 2020-07-06 MED ORDER — FENTANYL CITRATE (PF) 100 MCG/2ML IJ SOLN
INTRAMUSCULAR | Status: AC
  Filled 2020-07-06: qty 2

## 2020-07-06 MED ORDER — MIDAZOLAM HCL 2 MG/2ML IJ SOLN
INTRAMUSCULAR | Status: AC
  Filled 2020-07-06: qty 2

## 2020-07-06 MED ORDER — PROMETHAZINE HCL 25 MG/ML IJ SOLN: 6.2500 mg | INTRAMUSCULAR | Status: DC | PRN

## 2020-07-06 MED ORDER — FENTANYL CITRATE (PF) 100 MCG/2ML IJ SOLN: 25.0000 ug | INTRAMUSCULAR | Status: DC | PRN

## 2020-07-06 MED ORDER — SENNA 8.6 MG PO TABS: 2.0000 | ORAL_TABLET | Freq: Two times a day (BID) | ORAL | 0 refills | Status: DC

## 2020-07-06 MED ORDER — FENTANYL CITRATE (PF) 100 MCG/2ML IJ SOLN
50.0000 ug | Freq: Once | INTRAMUSCULAR | Status: AC
  Administered 2020-07-06: 100 ug via INTRAVENOUS

## 2020-07-06 MED ORDER — OXYCODONE HCL 5 MG PO TABS: 5.0000 mg | ORAL_TABLET | Freq: Four times a day (QID) | ORAL | 0 refills | Status: AC | PRN

## 2020-07-06 MED ORDER — PROPOFOL 500 MG/50ML IV EMUL
INTRAVENOUS | Status: DC | PRN
  Administered 2020-07-06: 25 ug/kg/min via INTRAVENOUS

## 2020-07-06 MED ORDER — VANCOMYCIN HCL 500 MG IV SOLR
INTRAVENOUS | Status: AC
  Filled 2020-07-06: qty 1000

## 2020-07-06 MED ORDER — CEFAZOLIN SODIUM-DEXTROSE 2-4 GM/100ML-% IV SOLN
2.0000 g | INTRAVENOUS | Status: AC
  Administered 2020-07-06: 2 g via INTRAVENOUS

## 2020-07-06 MED ORDER — PROPOFOL 10 MG/ML IV BOLUS
INTRAVENOUS | Status: DC | PRN
  Administered 2020-07-06: 160 mg via INTRAVENOUS

## 2020-07-06 MED ORDER — LACTATED RINGERS IV SOLN: INTRAVENOUS | Status: DC

## 2020-07-06 MED ORDER — SODIUM CHLORIDE 0.9 % IV SOLN: INTRAVENOUS | Status: DC

## 2020-07-06 MED ORDER — DOCUSATE SODIUM 100 MG PO CAPS: 100.0000 mg | ORAL_CAPSULE | Freq: Two times a day (BID) | ORAL | 0 refills | Status: AC

## 2020-07-06 MED ORDER — ACETAMINOPHEN 500 MG PO TABS
1000.0000 mg | ORAL_TABLET | Freq: Once | ORAL | Status: AC
  Administered 2020-07-06: 1000 mg via ORAL

## 2020-07-06 MED ORDER — HEMOSTATIC AGENTS (NO CHARGE) OPTIME
TOPICAL | Status: DC | PRN
  Administered 2020-07-06: 1

## 2020-07-06 MED ORDER — CELECOXIB 200 MG PO CAPS
200.0000 mg | ORAL_CAPSULE | Freq: Once | ORAL | Status: AC
  Administered 2020-07-06: 200 mg via ORAL

## 2020-07-06 MED ORDER — BUPIVACAINE LIPOSOME 1.3 % IJ SUSP
INTRAMUSCULAR | Status: DC | PRN
  Administered 2020-07-06: 10 mL

## 2020-07-06 MED ORDER — VANCOMYCIN HCL 500 MG IV SOLR
INTRAVENOUS | Status: DC | PRN
  Administered 2020-07-06: 500 mg

## 2020-07-06 MED ORDER — MIDAZOLAM HCL 2 MG/2ML IJ SOLN
1.0000 mg | Freq: Once | INTRAMUSCULAR | Status: AC
  Administered 2020-07-06: 2 mg via INTRAVENOUS

## 2020-07-06 MED ORDER — 0.9 % SODIUM CHLORIDE (POUR BTL) OPTIME
TOPICAL | Status: DC | PRN
  Administered 2020-07-06: 1000 mL

## 2020-07-06 MED ORDER — CEFAZOLIN SODIUM-DEXTROSE 2-4 GM/100ML-% IV SOLN
INTRAVENOUS | Status: AC
  Filled 2020-07-06: qty 100

## 2020-07-06 SURGICAL SUPPLY — 86 items
APL PRP STRL LF DISP 70% ISPRP (MISCELLANEOUS) ×1
APL SKNCLS STERI-STRIP NONHPOA (GAUZE/BANDAGES/DRESSINGS)
BANDAGE ESMARK 6X9 LF (GAUZE/BANDAGES/DRESSINGS) IMPLANT
BENZOIN TINCTURE PRP APPL 2/3 (GAUZE/BANDAGES/DRESSINGS) IMPLANT
BIT DRILL CAL 2.5 ST W/SLV (BIT) ×2 IMPLANT
BIT TREPHINE CORING 8 (BIT) ×2 IMPLANT
BIT TREPHINE CORING 8MM (BIT) ×1
BLADE SURG 15 STRL LF DISP TIS (BLADE) ×2 IMPLANT
BLADE SURG 15 STRL SS (BLADE) ×6
BNDG CMPR 9X4 STRL LF SNTH (GAUZE/BANDAGES/DRESSINGS)
BNDG CMPR 9X6 STRL LF SNTH (GAUZE/BANDAGES/DRESSINGS)
BNDG COHESIVE 4X5 TAN STRL (GAUZE/BANDAGES/DRESSINGS) ×3 IMPLANT
BNDG COHESIVE 6X5 TAN STRL LF (GAUZE/BANDAGES/DRESSINGS) IMPLANT
BNDG CONFORM 2 STRL LF (GAUZE/BANDAGES/DRESSINGS) ×3 IMPLANT
BNDG ELASTIC 4X5.8 VLCR STR LF (GAUZE/BANDAGES/DRESSINGS) IMPLANT
BNDG ESMARK 4X9 LF (GAUZE/BANDAGES/DRESSINGS) IMPLANT
BNDG ESMARK 6X9 LF (GAUZE/BANDAGES/DRESSINGS)
BOOT STEPPER DURA LG (SOFTGOODS) IMPLANT
BOOT STEPPER DURA MED (SOFTGOODS) IMPLANT
CANISTER SUCT 1200ML W/VALVE (MISCELLANEOUS) ×3 IMPLANT
CHLORAPREP W/TINT 26 (MISCELLANEOUS) ×3 IMPLANT
CLOSURE WOUND 1/2 X4 (GAUZE/BANDAGES/DRESSINGS)
COVER BACK TABLE 60X90IN (DRAPES) ×3 IMPLANT
COVER WAND RF STERILE (DRAPES) IMPLANT
CUFF TOURN SGL QUICK 34 (TOURNIQUET CUFF) ×3
CUFF TRNQT CYL 34X4.125X (TOURNIQUET CUFF) ×1 IMPLANT
DECANTER SPIKE VIAL GLASS SM (MISCELLANEOUS) IMPLANT
DRAPE EXTREMITY T 121X128X90 (DISPOSABLE) ×3 IMPLANT
DRAPE OEC MINIVIEW 54X84 (DRAPES) ×3 IMPLANT
DRAPE SURG 17X23 STRL (DRAPES) IMPLANT
DRAPE U-SHAPE 47X51 STRL (DRAPES) ×3 IMPLANT
DRSG MEPITEL 4X7.2 (GAUZE/BANDAGES/DRESSINGS) ×3 IMPLANT
DRSG PAD ABDOMINAL 8X10 ST (GAUZE/BANDAGES/DRESSINGS) ×4 IMPLANT
ELECT REM PT RETURN 9FT ADLT (ELECTROSURGICAL) ×3
ELECTRODE REM PT RTRN 9FT ADLT (ELECTROSURGICAL) ×1 IMPLANT
GAUZE SPONGE 4X4 12PLY STRL (GAUZE/BANDAGES/DRESSINGS) ×3 IMPLANT
GLOVE BIO SURGEON STRL SZ8 (GLOVE) ×3 IMPLANT
GLOVE BIOGEL PI IND STRL 8 (GLOVE) ×2 IMPLANT
GLOVE BIOGEL PI INDICATOR 8 (GLOVE) ×4
GLOVE ECLIPSE 8.0 STRL XLNG CF (GLOVE) ×3 IMPLANT
GOWN STRL REUS W/ TWL LRG LVL3 (GOWN DISPOSABLE) ×1 IMPLANT
GOWN STRL REUS W/ TWL XL LVL3 (GOWN DISPOSABLE) ×2 IMPLANT
GOWN STRL REUS W/TWL LRG LVL3 (GOWN DISPOSABLE) ×3
GOWN STRL REUS W/TWL XL LVL3 (GOWN DISPOSABLE) ×6
KIT STRATUM INSTRUMENT STD (KITS) ×3 IMPLANT
NEEDLE HYPO 22GX1.5 SAFETY (NEEDLE) IMPLANT
NS IRRIG 1000ML POUR BTL (IV SOLUTION) ×3 IMPLANT
PACK BASIN DAY SURGERY FS (CUSTOM PROCEDURE TRAY) ×3 IMPLANT
PAD CAST 4YDX4 CTTN HI CHSV (CAST SUPPLIES) ×1 IMPLANT
PADDING CAST ABS 4INX4YD NS (CAST SUPPLIES)
PADDING CAST ABS COTTON 4X4 ST (CAST SUPPLIES) IMPLANT
PADDING CAST COTTON 4X4 STRL (CAST SUPPLIES) ×3
PADDING CAST COTTON 6X4 STRL (CAST SUPPLIES) ×3 IMPLANT
PENCIL SMOKE EVACUATOR (MISCELLANEOUS) ×3 IMPLANT
PLATE STRT STRM 5H (Plate) ×2 IMPLANT
SANITIZER HAND PURELL 535ML FO (MISCELLANEOUS) ×3 IMPLANT
SCREW  STRATUM NL LP ST 3.5X18 (Screw) ×3 IMPLANT
SCREW LOCK STRATUM 3.5X18 (Screw) ×4 IMPLANT
SCREW NL LP STRATUM 3.5X16 (Screw) ×2 IMPLANT
SCREW STRATUM NL LP ST 3.5X18 (Screw) IMPLANT
SHEET MEDIUM DRAPE 40X70 STRL (DRAPES) ×3 IMPLANT
SLEEVE SCD COMPRESS KNEE MED (MISCELLANEOUS) ×3 IMPLANT
SPLINT FAST PLASTER 5X30 (CAST SUPPLIES) ×4
SPLINT PLASTER CAST FAST 5X30 (CAST SUPPLIES) IMPLANT
SPONGE LAP 18X18 RF (DISPOSABLE) ×3 IMPLANT
SPONGE SURGIFOAM ABS GEL 12-7 (HEMOSTASIS) ×2 IMPLANT
STOCKINETTE 6  STRL (DRAPES) ×3
STOCKINETTE 6 STRL (DRAPES) ×1 IMPLANT
STRIP CLOSURE SKIN 1/2X4 (GAUZE/BANDAGES/DRESSINGS) IMPLANT
SUCTION FRAZIER HANDLE 10FR (MISCELLANEOUS) ×3
SUCTION TUBE FRAZIER 10FR DISP (MISCELLANEOUS) ×1 IMPLANT
SUT ETHILON 3 0 PS 1 (SUTURE) ×3 IMPLANT
SUT FIBERWIRE #2 38 T-5 BLUE (SUTURE)
SUT MNCRL AB 3-0 PS2 18 (SUTURE) ×3 IMPLANT
SUT VIC AB 0 SH 27 (SUTURE) IMPLANT
SUT VIC AB 2-0 SH 18 (SUTURE) IMPLANT
SUT VIC AB 2-0 SH 27 (SUTURE)
SUT VIC AB 2-0 SH 27XBRD (SUTURE) IMPLANT
SUT VICRYL 0 UR6 27IN ABS (SUTURE) ×3 IMPLANT
SUTURE FIBERWR #2 38 T-5 BLUE (SUTURE) IMPLANT
SYR BULB EAR ULCER 3OZ GRN STR (SYRINGE) ×3 IMPLANT
SYR CONTROL 10ML LL (SYRINGE) IMPLANT
TOWEL GREEN STERILE FF (TOWEL DISPOSABLE) ×6 IMPLANT
TUBE CONNECTING 20'X1/4 (TUBING) ×1
TUBE CONNECTING 20X1/4 (TUBING) ×2 IMPLANT
UNDERPAD 30X36 HEAVY ABSORB (UNDERPADS AND DIAPERS) ×3 IMPLANT

## 2020-07-06 NOTE — Progress Notes (Signed)
Assisted Dr. Singer with right, ultrasound guided, popliteal, adductor canal block. Side rails up, monitors on throughout procedure. See vital signs in flow sheet. Tolerated Procedure well. °

## 2020-07-06 NOTE — Discharge Instructions (Addendum)
Sara Hewitt, MD [facility]  Please read the following information regarding your care after surgery.  Medications  You only need a prescription for the narcotic pain medicine (ex. oxycodone, Percocet, Norco).  All of the other medicines listed below are available over the counter. X Aleve 2 pills twice a day for the first 3 days after surgery. X acetominophen (Tylenol) 650 mg every 4-6 hours as you need for minor to moderate pain X oxycodone as prescribed for severe pain  Narcotic pain medicine (ex. oxycodone, Percocet, Vicodin) will cause constipation.  To prevent this problem, take the following medicines while you are taking any pain medicine. X docusate sodium (Colace) 100 mg twice a day X senna (Senokot) 2 tablets twice a day  X To help prevent blood clots, take a baby aspirin (81 mg) twice a day for six weeks after surgery.  You should also get up every hour while you are awake to move around.    Weight Bearing X Do not bear any weight on the operated leg or foot.  Cast / Splint / Dressing X Keep your splint, cast or dressing clean and dry.  Don't put anything (coat hanger, pencil, etc) down inside of it.  If it gets damp, use a hair dryer on the cool setting to dry it.  If it gets soaked, call the office to schedule an appointment for a cast change.   After your dressing, cast or splint is removed; you may shower, but do not soak or scrub the wound.  Allow the water to run over it, and then gently pat it dry.  Swelling It is normal for you to have swelling where you had surgery.  To reduce swelling and pain, keep your toes above your nose for at least 3 days after surgery.  It may be necessary to keep your foot or leg elevated for several weeks.  If it hurts, it should be elevated.  Follow Up Call my office at [phone] when you are discharged from the hospital or surgery center to schedule an appointment to be seen two weeks after surgery.  Call my office at [phone] if  you develop a fever >101.5 F, nausea, vomiting, bleeding from the surgical site or severe pain.     Post Anesthesia Home Care Instructions  Activity: Get plenty of rest for the remainder of the day. A responsible individual must stay with you for 24 hours following the procedure.  For the next 24 hours, DO NOT: -Drive a car -Operate machinery -Drink alcoholic beverages -Take any medication unless instructed by your physician -Make any legal decisions or sign important papers.  Meals: Start with liquid foods such as gelatin or soup. Progress to regular foods as tolerated. Avoid greasy, spicy, heavy foods. If nausea and/or vomiting occur, drink only clear liquids until the nausea and/or vomiting subsides. Call your physician if vomiting continues.  Special Instructions/Symptoms: Your throat may feel dry or sore from the anesthesia or the breathing tube placed in your throat during surgery. If this causes discomfort, gargle with warm salt water. The discomfort should disappear within 24 hours.  If you had a scopolamine patch placed behind your ear for the management of post- operative nausea and/or vomiting:  1. The medication in the patch is effective for 72 hours, after which it should be removed.  Wrap patch in a tissue and discard in the trash. Wash hands thoroughly with soap and water. 2. You may remove the patch earlier than 72 hours if you experience   unpleasant side effects which may include dry mouth, dizziness or visual disturbances. 3. Avoid touching the patch. Wash your hands with soap and water after contact with the patch.    Regional Anesthesia Blocks  1. Numbness or the inability to move the "blocked" extremity may last from 3-48 hours after placement. The length of time depends on the medication injected and your individual response to the medication. If the numbness is not going away after 48 hours, call your surgeon.  2. The extremity that is blocked will need to be  protected until the numbness is gone and the  Strength has returned. Because you cannot feel it, you will need to take extra care to avoid injury. Because it may be weak, you may have difficulty moving it or using it. You may not know what position it is in without looking at it while the block is in effect.  3. For blocks in the legs and feet, returning to weight bearing and walking needs to be done carefully. You will need to wait until the numbness is entirely gone and the strength has returned. You should be able to move your leg and foot normally before you try and bear weight or walk. You will need someone to be with you when you first try to ensure you do not fall and possibly risk injury.  4. Bruising and tenderness at the needle site are common side effects and will resolve in a few days.  5. Persistent numbness or new problems with movement should be communicated to the surgeon or the [place] [phone] [place] [phone]).Information for Discharge Teaching: EXPAREL (bupivacaine liposome injectable suspension)   Your surgeon or anesthesiologist gave you EXPAREL(bupivacaine) to help control your pain after surgery.   EXPAREL is a local anesthetic that provides pain relief by numbing the tissue around the surgical site.  EXPAREL is designed to release pain medication over time and can control pain for up to 72 hours.  Depending on how you respond to EXPAREL, you may require less pain medication during your recovery.  Possible side effects:  Temporary loss of sensation or ability to move in the area where bupivacaine was injected.  Nausea, vomiting, constipation  Rarely, numbness and tingling in your mouth or lips, lightheadedness, or anxiety may occur.  Call your doctor right away if you think you may be experiencing any of these sensations, or if you have other questions regarding possible side effects.  Follow all other discharge  instructions given to you by your surgeon or nurse. Eat a healthy diet and drink plenty of water or other fluids.  If you return to the hospital for any reason within 96 hours following the administration of EXPAREL, it is important for health care providers to know that you have received this anesthetic. A teal colored band has been placed on your arm with the date, time and amount of EXPAREL you have received in order to alert and inform your health care providers. Please leave this armband in place for the full 96 hours following administration, and then you may remove the band.

## 2020-07-06 NOTE — Anesthesia Postprocedure Evaluation (Signed)
Anesthesia Post Note  Patient: Sara Barnett  Procedure(s) Performed: Removal of deep implants right 1st metatarsal (Right Foot) 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 (Right Foot)     Patient location during evaluation: PACU Anesthesia Type: General Level of consciousness: sedated Pain management: pain level controlled Vital Signs Assessment: post-procedure vital signs reviewed and stable Respiratory status: spontaneous breathing and respiratory function stable Cardiovascular status: stable Postop Assessment: no apparent nausea or vomiting Anesthetic complications: no   No complications documented.  Last Vitals:  Vitals:   07/06/20 0900 07/06/20 1030  BP: 98/67 103/70  Pulse: 81 77  Resp: 17 16  Temp:  (!) 36.2 C  SpO2: 100% 97%    Last Pain:  Vitals:   07/06/20 1030  TempSrc:   PainSc: 0-No pain                 Amilyah Nack DANIEL

## 2020-07-06 NOTE — Op Note (Signed)
07/06/2020  8:45 AM  PATIENT:  Sara Barnett  54 y.o. female  PRE-OPERATIVE DIAGNOSIS:   1.  painful orthopedic hardware right foot      2.  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  painful orthopedic hardware right foot      2.  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  Right hallux varus  Procedure(s): 1.  Removal of deep implants right 1st metatarsal 2.  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 and calcaneal autograft 3.  Repair of right hallux MPJ lateral collateral ligament 4.  AP and lateral xrays of the right foot  SURGEON:  Wylene Simmer, MD  ASSISTANT: Mechele Claude, PA-C  ANESTHESIA:   General, regional  EBL:  minimal   TOURNIQUET:   Total Tourniquet Time Documented: Thigh (Right) - 62 minutes Total: Thigh (Right) - 62 minutes  COMPLICATIONS:  None apparent  DISPOSITION:  Extubated, awake and stable to recovery.  INDICATION FOR PROCEDURE: The patient is a 54 year old female with a history of right forefoot bunion correction by Dr. Caprice Beaver.  She has a painful nonunion of the first metatarsal osteotomy and presents now for surgical treatment.  The risks and benefits of the alternative treatment options have been discussed in detail.  The patient wishes to proceed with surgery and specifically understands risks of bleeding, infection, nerve damage, blood clots, need for additional surgery, amputation and death.  PROCEDURE IN DETAIL:  After pre operative consent was obtained, and the correct operative site was identified, the patient was brought to the operating room and placed supine on the OR table.  Anesthesia was administered.  Pre-operative antibiotics were administered.  A surgical timeout was taken.  The right lower extremity was prepped and draped in standard sterile fashion with a tourniquet around the thigh.  The extremity was elevated and the tourniquet was inflated to 250 mmHg.  The  previous dorsal medial incision was identified over the right hallux and first metatarsal.  It was opened again sharply and dissection carried down through the subcutaneous tissues.  Subperiosteal elevation was then carried dorsally and medially to expose the osteotomy site.  There was gross instability noted at the osteotomy site with sclerosis of the bone on both sides.  The screw was noted proximally.  It was cleaned of all overgrown bone and removed with the appropriate screwdriver.  Deep tissue was obtained from the osteotomy site and sent as a specimen for aerobic and anaerobic culture.  The wound was irrigated copiously.  The bone ends were freshened with a rondure and a small drill bit.  Autograft bone was harvested from the calcaneus through a lateral wall approach.  The autograft bone was morselized and packed into the osteotomy.  The osteotomy was reduced and provisionally pinned.  AP and lateral radiographs confirmed appropriate alignment of the bone.  A 5 hole stratum plate was selected and provisionally pinned.  Radiographs confirmed appropriate position of the plate.  The time holes were drilled and the template removed.  The plate was inserted and impacted into position carefully.  The distal hole was then drilled and a nonlocking screw inserted pulling the plate securely down to bone.  The compression slot was then drilled proximally.  The threaded pin was inserted followed by the knot.  The knot was tightened compressing the osteotomy site appropriately.  A nonlocking screw was then inserted proximal to the osteotomy site pulling the  plate down to bone.  The distal nonlocking screw was exchanged for a locking screw.  The next most proximal hole proximal to the osteotomy site was drilled and a locking screw inserted.  The compression pin and not were removed along with the ramp.  The hole was then filled with a nonlocking screw.  Final AP and lateral radiographs confirmed appropriate position and  length of all hardware and appropriate reduction of the nonunion site.  The hallux was noted to have residual hallux varus.  The lateral collateral ligament was noted to be redundant and incompetent.  The joint was reduced appropriately.  0 Vicryl imbricating sutures were placed in the lateral collateral ligament tightening it appropriately.  Wound was irrigated copiously and sprinkled with vancomycin powder.  A Gelfoam sponge was placed in the autograft donor site in the calcaneus.  The lateral wall incision was closed with nylon.  The forefoot wound was closed with Monocryl and nylon.  Sterile dressings were applied followed by a compression wrap and a well-padded short leg splint with a spica extension at the hallux.  Tourniquet was released after application of the dressings.  The patient was awakened from anesthesia and transported to the recovery room in stable condition.   FOLLOW UP PLAN: Nonweightbearing on the right lower extremity for 6 weeks.  Follow-up in the office in 2 weeks for suture removal and conversion to a short leg cast.  No toe spacer.  Aspirin for DVT prophylaxis.   RADIOGRAPHS: AP and lateral radiographs of the right foot are obtained intraoperatively.  These show interval treatment of the nonunion site with bone graft and internal fixation.  Hardware is appropriately positioned and of the appropriate lengths.  No other acute injuries are noted.    Mechele Claude PA-C was present and scrubbed for the duration of the operative case. His assistance was essential in positioning the patient, prepping and draping, gaining and maintaining exposure, performing the operation, closing and dressing the wounds and applying the splint.

## 2020-07-06 NOTE — H&P (Signed)
Sara Barnett is an 54 y.o. female.   Chief Complaint: right foot pain HPI:  54 y/o female with chronic right forefoot pain after bunion surgery by a local podiatrist.  CT scan shows a nonunion and loose hardware at the site of the MT osteotomy.  She has failed non op treatment and presents today for removal of the painful hardware and open treatment of the MT osteotomy nonunion.  Past Medical History:  Diagnosis Date  . ADHD   . Anxiety   . Arthritis   . Chronic ethmoidal sinusitis 07/2017  . Chronic maxillary sinusitis 07/2017  . Complication of anesthesia    Anaphylaxis after Methylprednisolone injection - cardiac arrest/PEA  . 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 Depression   . Deviated nasal septum 07/2017  . Essential hypertension   . GERD (gastroesophageal reflux disease)   . History of cardiac murmur   . Legally blind   . Restless leg     Past Surgical History:  Procedure Laterality Date  . ABLATION ON ENDOMETRIOSIS    . BUNIONECTOMY Right   . BUNIONECTOMY Right 02/09/2020   Procedure: BUNIONECTOMY RIGHT FOOT WITH SESAMOIDECTOMY, LENGTHENING OF RIGHT EXTENSOR HALLUCIS LONGUS TENDON, PLANTAR FASCIA RELEASE OF RIGHT FOOT;  Surgeon: Caprice Beaver, DPM;  Location: [facility];  Service: Podiatry;  Laterality: Right;  . CATARACT EXTRACTION, BILATERAL     age 30 year  . CESAREAN SECTION     x 2  . COLONOSCOPY WITH PROPOFOL N/A 08/25/2018   moderate pancolonic diverticulosis, external and internal hemorrhoids, redundant left colon.   . ENDOSCOPIC CONCHA BULLOSA RESECTION Bilateral 08/25/2017   Procedure: BILATERAL ENDOSCOPIC CONCHA BULLOSA RESECTION;  Surgeon: Leta Baptist, MD;  Location: [facility];  Service: ENT;  Laterality: Bilateral;  . ETHMOIDECTOMY Bilateral 08/25/2017   Procedure: BILATERAL TOTAL ETHMOIDECTOMY;  Surgeon: Leta Baptist, MD;  Location: [facility];  Service: ENT;  Laterality: Bilateral;  . EYE SURGERY    . INTRAOCULAR LENS  INSERTION Bilateral    age 47s  . LUMBAR FUSION  07/05/2004  . LUMBAR LAMINECTOMY  07/05/2004   L5-S1  . MAXILLARY ANTROSTOMY Bilateral 08/25/2017   Procedure: BILATERAL MAXILLARY ANTROSTOMY WITH TISSUE REMOVAL;  Surgeon: Leta Baptist, MD;  Location: [facility];  Service: ENT;  Laterality: Bilateral;  . MEMBRANE PEEL Right 11/06/2011; 08/06/2013  . NASAL SEPTOPLASTY W/ TURBINOPLASTY Bilateral 08/25/2017   Procedure: NASAL SEPTOPLASTY WITH BILATERAL TURBINATE REDUCTION;  Surgeon: Leta Baptist, MD;  Location: [facility];  Service: ENT;  Laterality: Bilateral;  . PARS PLANA VITRECTOMY Right 03/22/2011; 08/21/2011; 12/20/2011; 05/13/2012; 11/20/2012; 05/12/2013; 08/06/2013; 05/17/2016  . PARS PLANA VITRECTOMY W/ SCLERAL BUCKLE Left 04/21/2015  . PLANTAR FASCIA RELEASE Right   . SINUS ENDO W/FUSION Bilateral 08/25/2017   Procedure: ENDOSCOPIC SINUS SURGERY WITH FUSION NAVIGATION;  Surgeon: Leta Baptist, MD;  Location: [facility];  Service: ENT;  Laterality: Bilateral;  . TUBAL LIGATION      Family History  Problem Relation Age of Onset  . Depression Mother   . Hypertension Father   . Prostate cancer Father   . Kidney failure Father   . Colon cancer Neg Hx   . Colon polyps Neg Hx    Social History:  reports that she quit smoking about 11 years ago. Her smoking use included cigarettes. She has a 45.00 pack-year smoking history. She has never used smokeless tobacco. She reports current alcohol use. She reports that she does not use drugs.  Allergies:  Allergies  Allergen Reactions  . Doxycycline Itching and Other (See Comments)    THROAT SWELLING  . Hydrocodone-Acetaminophen Itching and Other (See Comments)    THROAT SWELLING  . Methylprednisolone Anaphylaxis and Other (See Comments)    CARDIAC ARREST/PEA   . Other Other (See Comments)    Steroids - Unknown drug and strength, stopped patient's heart  . Augmentin [Amoxicillin-Pot Clavulanate] Other (See  Comments)    "FEELING HOT"; EXTREME SLEEPINESS  . Statins Other (See Comments)    JOINT PAIN, swellling    Medications Prior to Admission  Medication Sig Dispense Refill  . amLODipine (NORVASC) 10 MG tablet Take 10 mg by mouth daily.    . benazepril (LOTENSIN) 40 MG tablet Take 40 mg by mouth daily.    Marland Kitchen buPROPion (WELLBUTRIN XL) 300 MG 24 hr tablet Take 1 tablet (300 mg total) by mouth daily. 30 tablet 0  . clonazePAM (KLONOPIN) 0.5 MG tablet Take 0.5 mg by mouth at bedtime as needed (To help with restless mind).     . furosemide (LASIX) 20 MG tablet Take 20 mg by mouth in the morning.    . lansoprazole (PREVACID) 30 MG capsule Take 30 mg by mouth daily.     Marland Kitchen linaclotide (LINZESS) 145 MCG CAPS capsule Take 1 capsule (145 mcg total) by mouth daily before breakfast. 90 capsule 3  . pramipexole (MIRAPEX) 0.125 MG tablet TAKE 3 TABLETS BY MOUTH ONCE DAILY IN THE EVENING 90 TO 120 MINUTES BEFORE BEDTIME (Patient taking differently: Take 0.375 mg by mouth at bedtime. 90 TO 120 MINUTES BEFORE BEDTIME) 270 tablet 1  . prednisoLONE acetate (PRED FORTE) 1 % ophthalmic suspension Place 1 drop into the right eye in the morning and at bedtime.     . methocarbamol (ROBAXIN) 500 MG tablet Take 500 mg by mouth as needed.      Results for orders placed or performed during the hospital encounter of 07/06/20 (from the past 48 hour(s))  Basic metabolic panel per protocol     Status: None   Collection Time: 07/04/20  3:17 PM  Result Value Ref Range   Sodium 136 135 - 145 mmol/L   Potassium 4.5 3.5 - 5.1 mmol/L   Chloride 104 98 - 111 mmol/L   CO2 24 22 - 32 mmol/L   Glucose, Bld 88 70 - 99 mg/dL    Comment: Glucose reference range applies only to samples taken after fasting for at least 8 hours.   BUN 7 6 - 20 mg/dL   Creatinine, Ser 0.80 0.44 - 1.00 mg/dL   Calcium 9.0 8.9 - 10.3 mg/dL   GFR calc non Af Amer >60 >60 mL/min   GFR calc Af Amer >60 >60 mL/min   Anion gap 8 5 - 15    Comment: Performed  at [facility] [address] [place], [place] [address]  Pregnancy, urine POC     Status: None   Collection Time: 07/06/20  6:45 AM  Result Value Ref Range   Preg Test, Ur NEGATIVE NEGATIVE    Comment:        THE SENSITIVITY OF THIS METHODOLOGY IS >24 mIU/mL    No results found.  Review of Systems  No recent f/c/n/v/wt loss.  Blood pressure 112/86, pulse 66, temperature 97.7 F (36.5 C), temperature source Oral, resp. rate 14, height 5\' 3"  (1.6 m), weight 84.2 kg, last menstrual period 06/22/2020, SpO2 100 %. Physical Exam  wn wd woman in nad.  A  and ox 4.  Normal mood and affect.  R foot with healed surgical incision.  Skin o/w intact.  No lymphadenopathy.  4/5 strength in PF and DF of the ankle and foot.  Pulses are palpable.  Intact sens to LT dorsally and plantarly.  Assessment/Plan R 1st MT nonunion and pain ful hardware.  To OR for surgical treatment.  The risks and benefits of the alternative treatment options have been discussed in detail.  The patient wishes to proceed with surgery and specifically understands risks of bleeding, infection, nerve damage, blood clots, need for additional surgery, amputation and death.   Wylene Simmer, MD 08/03/2020, 7:14 AM

## 2020-07-06 NOTE — Anesthesia Procedure Notes (Signed)
Procedure Name: LMA Insertion Date/Time: 07/06/2020 7:23 AM Performed by: Signe Colt, CRNA Pre-anesthesia Checklist: Patient identified, Emergency Drugs available, Suction available and Patient being monitored Patient Re-evaluated:Patient Re-evaluated prior to induction Oxygen Delivery Method: Circle system utilized Preoxygenation: Pre-oxygenation with 100% oxygen Induction Type: IV induction Ventilation: Mask ventilation without difficulty LMA: LMA inserted LMA Size: 4.0 Number of attempts: 1 Airway Equipment and Method: Bite block Placement Confirmation: positive ETCO2 Tube secured with: Tape Dental Injury: Teeth and Oropharynx as per pre-operative assessment

## 2020-07-06 NOTE — Anesthesia Procedure Notes (Signed)
Anesthesia Regional Block: Popliteal block   Pre-Anesthetic Checklist: ,, timeout performed, Correct Patient, Correct Site, Correct Laterality, Correct Procedure, Correct Position, site marked, Risks and benefits discussed,  Surgical consent,  Pre-op evaluation,  At surgeon's request and post-op pain management  Laterality: Right  Prep: chloraprep       Needles:  Injection technique: Single-shot  Needle Type: Echogenic Stimulator Needle          Additional Needles:   Narrative:  Start time: 07/06/2020 6:52 AM End time: 07/06/2020 7:02 AM Injection made incrementally with aspirations every 5 mL.  Performed by: Personally  Anesthesiologist: Duane Boston, MD  Additional Notes: A functioning IV was confirmed and monitors were applied.  Sterile prep and drape, hand hygiene and sterile gloves were used.  Negative aspiration and test dose prior to incremental administration of local anesthetic. The patient tolerated the procedure well.Ultrasound  guidance: relevant anatomy identified, needle position confirmed, local anesthetic spread visualized around nerve(s), vascular puncture avoided.  Image printed for medical record. ACB supplement.

## 2020-07-06 NOTE — Transfer of Care (Signed)
Immediate Anesthesia Transfer of Care Note  Patient: Sara Barnett  Procedure(s) Performed: Removal of deep implants right 1st metatarsal (Right Foot) 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 (Right Foot)  Patient Location: PACU  Anesthesia Type:GA combined with regional for post-op pain  Level of Consciousness: sedated  Airway & Oxygen Therapy: Patient Spontanous Breathing and Patient connected to face mask oxygen  Post-op Assessment: Report given to RN and Post -op Vital signs reviewed and stable  Post vital signs: Reviewed and stable  Last Vitals:  Vitals Value Taken Time  BP 86/55 07/06/20 0848  Temp    Pulse 91 07/06/20 0851  Resp 28 07/06/20 0851  SpO2 97 % 07/06/20 0851  Vitals shown include unvalidated device data.  Last Pain:  Vitals:   07/06/20 0702  TempSrc:   PainSc: 0-No pain         Complications: No complications documented.

## 2020-07-07 ENCOUNTER — Encounter (HOSPITAL_BASED_OUTPATIENT_CLINIC_OR_DEPARTMENT_OTHER): Payer: Self-pay | Admitting: Orthopedic Surgery

## 2020-07-11 LAB — AEROBIC/ANAEROBIC CULTURE W GRAM STAIN (SURGICAL/DEEP WOUND)
Culture: NO GROWTH
Gram Stain: NONE SEEN

## 2020-07-12 DIAGNOSIS — I10 Essential (primary) hypertension: Secondary | ICD-10-CM | POA: Diagnosis not present

## 2020-07-24 DIAGNOSIS — T8484XA Pain due to internal orthopedic prosthetic devices, implants and grafts, initial encounter: Secondary | ICD-10-CM | POA: Diagnosis not present

## 2020-07-24 DIAGNOSIS — Z4889 Encounter for other specified surgical aftercare: Secondary | ICD-10-CM | POA: Diagnosis not present

## 2020-07-24 DIAGNOSIS — S92314K Nondisplaced fracture of first metatarsal bone, right foot, subsequent encounter for fracture with nonunion: Secondary | ICD-10-CM | POA: Diagnosis not present

## 2020-07-25 DIAGNOSIS — I10 Essential (primary) hypertension: Secondary | ICD-10-CM | POA: Diagnosis not present

## 2020-07-25 DIAGNOSIS — K219 Gastro-esophageal reflux disease without esophagitis: Secondary | ICD-10-CM | POA: Diagnosis not present

## 2020-07-25 DIAGNOSIS — E7849 Other hyperlipidemia: Secondary | ICD-10-CM | POA: Diagnosis not present

## 2020-08-05 ENCOUNTER — Other Ambulatory Visit: Payer: Self-pay | Admitting: Adult Health

## 2020-08-23 DIAGNOSIS — Z4789 Encounter for other orthopedic aftercare: Secondary | ICD-10-CM | POA: Diagnosis not present

## 2020-08-23 DIAGNOSIS — M79671 Pain in right foot: Secondary | ICD-10-CM | POA: Diagnosis not present

## 2020-08-24 DIAGNOSIS — K219 Gastro-esophageal reflux disease without esophagitis: Secondary | ICD-10-CM | POA: Diagnosis not present

## 2020-08-24 DIAGNOSIS — I10 Essential (primary) hypertension: Secondary | ICD-10-CM | POA: Diagnosis not present

## 2020-08-24 DIAGNOSIS — E7849 Other hyperlipidemia: Secondary | ICD-10-CM | POA: Diagnosis not present

## 2020-09-20 DIAGNOSIS — Z4889 Encounter for other specified surgical aftercare: Secondary | ICD-10-CM | POA: Diagnosis not present

## 2020-09-20 DIAGNOSIS — M79671 Pain in right foot: Secondary | ICD-10-CM | POA: Diagnosis not present

## 2020-09-28 DIAGNOSIS — H6502 Acute serous otitis media, left ear: Secondary | ICD-10-CM | POA: Diagnosis not present

## 2020-10-12 ENCOUNTER — Telehealth: Payer: Self-pay | Admitting: Adult Health

## 2020-10-12 NOTE — Telephone Encounter (Signed)
..   Pt understands that although there may be some limitations with this type of visit, we will take all precautions to reduce any security or privacy concerns.  Pt understands that this will be treated like an in office visit and we will file with pt's insurance, and there may be a patient responsible charge related to this service. ? ?

## 2020-10-12 NOTE — Telephone Encounter (Signed)
noted RLS

## 2020-10-16 ENCOUNTER — Telehealth (INDEPENDENT_AMBULATORY_CARE_PROVIDER_SITE_OTHER): Payer: PPO | Admitting: Adult Health

## 2020-10-16 DIAGNOSIS — G2581 Restless legs syndrome: Secondary | ICD-10-CM | POA: Diagnosis not present

## 2020-10-16 MED ORDER — PRAMIPEXOLE DIHYDROCHLORIDE 0.125 MG PO TABS: ORAL_TABLET | ORAL | 3 refills | Status: DC

## 2020-10-16 NOTE — Progress Notes (Addendum)
PATIENT: Sara Barnett DOB: 08/17/1966  REASON FOR VISIT: follow up HISTORY FROM: patient  Virtual Visit via Video Note  I connected with Sara Barnett on 10/16/20 at  1:00 PM EST by a video enabled telemedicine application located remotely at [facility] and verified that I am speaking with the correct person using two identifiers who was located at their own home.   I discussed the limitations of evaluation and management by telemedicine and the availability of in person appointments. The patient expressed understanding and agreed to proceed.   PATIENT: Sara Barnett DOB: 10/19/1966  REASON FOR VISIT: follow up HISTORY FROM: patient  HISTORY OF PRESENT ILLNESS: Today 10/16/20:  Sara Barnett is a 54 year old female with a history of restless leg syndrome.  She returns today for virtual visit.  She reports that Mirapex is working well for her.  She continues to take 3 tablets at bedtime.  She states that she has been having foot pain and is seeing a Psychologist, sport and exercise.  Apparently a previous surgery resulted in further injury per the patient.  She states that she has a lot of stress with her younger daughter but other than that she is doing well.  10/14/19:Sara Barnett is a 54 year old female with a history of restless leg syndrome.  She returns today for virtual visit.  She reports that Mirapex is working well for her.  She states that restless legs is not affecting her ability to go to sleep or stay asleep.  She tolerates the medication well.  She reports that she continues to have dizziness.  She reports that she advised her PCP and they reduced her blood pressure medicine but this has continued.  She reports that the dizziness occurs with position changes particularly when she is bending down or squatting down.  She states that she will feel dizzy and most the time her vision goes black but she does not pass out.  She states that this may last for couple  seconds.  She returns today for a follow-up.  HISTORY 04/13/19:  Sara Barnett is a 54 year old female with a history of restless leg syndrome.  She reports that she has been taking Mirapex 3 tablets at bedtime.  She reports that this has been beneficial for her restless leg syndrome.  She states that last week she injured her back.  It is unclear how she injured her back.  She states that she has been having some back pain but has not followed with her primary care.  She reports that in the past she has had a spinal fusion.  She also states that his she bends down and stands up she feels lightheaded.  She states this is been going on for quite some time.  She has not mentioned this to her PCP either.  She joins me today for virtual visit.  REVIEW OF SYSTEMS: Out of a complete 14 system review of symptoms, the patient complains only of the following symptoms, and all other reviewed systems are negative.  See HPI  ALLERGIES: Allergies  Allergen Reactions  . Doxycycline Itching and Other (See Comments)    THROAT SWELLING  . Hydrocodone-Acetaminophen Itching and Other (See Comments)    THROAT SWELLING  . Methylprednisolone Anaphylaxis and Other (See Comments)    CARDIAC ARREST/PEA   . Other Other (See Comments)    Steroids - Unknown drug and strength, stopped patient's heart  . Augmentin [Amoxicillin-Pot Clavulanate] Other (See Comments)    "FEELING HOT"; EXTREME  SLEEPINESS  . Statins Other (See Comments)    JOINT PAIN, swellling    HOME MEDICATIONS: Outpatient Medications Prior to Visit  Medication Sig Dispense Refill  . amLODipine (NORVASC) 10 MG tablet Take 10 mg by mouth daily.    Marland Kitchen aspirin EC 81 MG tablet Take 1 tablet (81 mg total) by mouth 2 (two) times daily. 84 tablet 0  . benazepril (LOTENSIN) 40 MG tablet Take 40 mg by mouth daily.    Marland Kitchen buPROPion (WELLBUTRIN XL) 300 MG 24 hr tablet Take 1 tablet (300 mg total) by mouth daily. 30 tablet 0  . clonazePAM (KLONOPIN) 0.5 MG  tablet Take 0.5 mg by mouth at bedtime as needed (To help with restless mind).     . docusate sodium (COLACE) 100 MG capsule Take 1 capsule (100 mg total) by mouth 2 (two) times daily. While taking narcotic pain medicine. 30 capsule 0  . furosemide (LASIX) 20 MG tablet Take 20 mg by mouth in the morning.    . lansoprazole (PREVACID) 30 MG capsule Take 30 mg by mouth daily.     Marland Kitchen linaclotide (LINZESS) 145 MCG CAPS capsule Take 1 capsule (145 mcg total) by mouth daily before breakfast. 90 capsule 3  . methocarbamol (ROBAXIN) 500 MG tablet Take 500 mg by mouth as needed.    . pramipexole (MIRAPEX) 0.125 MG tablet TAKE 3 TABLETS BY MOUTH ONCE DAILY IN THE EVENING 90 TO 120 MINUTES BEFORE BEDTIME 270 tablet 0  . prednisoLONE acetate (PRED FORTE) 1 % ophthalmic suspension Place 1 drop into the right eye in the morning and at bedtime.     . senna (SENOKOT) 8.6 MG TABS tablet Take 2 tablets (17.2 mg total) by mouth 2 (two) times daily. 30 tablet 0   No facility-administered medications prior to visit.    PAST MEDICAL HISTORY: Past Medical History:  Diagnosis Date  . ADHD   . Anxiety   . Arthritis   . Chronic ethmoidal sinusitis 07/2017  . Chronic maxillary sinusitis 07/2017  . Complication of anesthesia    Anaphylaxis after Methylprednisolone injection - cardiac arrest/PEA  . 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 Depression   . Deviated nasal septum 07/2017  . Essential hypertension   . GERD (gastroesophageal reflux disease)   . History of cardiac murmur   . Legally blind   . Restless leg     PAST SURGICAL HISTORY: Past Surgical History:  Procedure Laterality Date  . ABLATION ON ENDOMETRIOSIS    . BUNIONECTOMY Right   . BUNIONECTOMY Right 02/09/2020   Procedure: BUNIONECTOMY RIGHT FOOT WITH SESAMOIDECTOMY, LENGTHENING OF RIGHT EXTENSOR HALLUCIS LONGUS TENDON, PLANTAR FASCIA RELEASE OF RIGHT FOOT;  Surgeon: Caprice Beaver, DPM;  Location: [facility];  Service: Podiatry;  Laterality:  Right;  . CATARACT EXTRACTION, BILATERAL     age 62 year  . CESAREAN SECTION     x 2  . COLONOSCOPY WITH PROPOFOL N/A 08/25/2018   moderate pancolonic diverticulosis, external and internal hemorrhoids, redundant left colon.   . ENDOSCOPIC CONCHA BULLOSA RESECTION Bilateral 08/25/2017   Procedure: BILATERAL ENDOSCOPIC CONCHA BULLOSA RESECTION;  Surgeon: Leta Baptist, MD;  Location: [place];  Service: ENT;  Laterality: Bilateral;  . ETHMOIDECTOMY Bilateral 08/25/2017   Procedure: BILATERAL TOTAL ETHMOIDECTOMY;  Surgeon: Leta Baptist, MD;  Location: [place];  Service: ENT;  Laterality: Bilateral;  . EYE SURGERY    . HARDWARE REMOVAL Right 07/06/2020   Procedure: Removal of deep implants right 1st metatarsal;  Surgeon: Wylene Simmer, MD;  Location: [place];  Service: Orthopedics;  Laterality: Right;  20min  . INTRAOCULAR LENS INSERTION Bilateral    age 27s  . LUMBAR FUSION  07/05/2004  . LUMBAR LAMINECTOMY  07/05/2004   L5-S1  . MAXILLARY ANTROSTOMY Bilateral 08/25/2017   Procedure: BILATERAL MAXILLARY ANTROSTOMY WITH TISSUE REMOVAL;  Surgeon: Leta Baptist, MD;  Location: [place];  Service: ENT;  Laterality: Bilateral;  . MEMBRANE PEEL Right 11/06/2011; 08/06/2013  . NASAL SEPTOPLASTY W/ TURBINOPLASTY Bilateral 08/25/2017   Procedure: NASAL SEPTOPLASTY WITH BILATERAL TURBINATE REDUCTION;  Surgeon: Leta Baptist, MD;  Location: [place];  Service: ENT;  Laterality: Bilateral;  . 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 PARS PLANA VITRECTOMY Right 03/22/2011; 08/21/2011; 12/20/2011; 05/13/2012; 11/20/2012; 05/12/2013; 08/06/2013; 05/17/2016  . PARS PLANA VITRECTOMY W/ SCLERAL BUCKLE Left 04/21/2015  . PLANTAR FASCIA RELEASE Right   . SINUS ENDO  W/FUSION Bilateral 08/25/2017   Procedure: ENDOSCOPIC SINUS SURGERY WITH FUSION NAVIGATION;  Surgeon: Leta Baptist, MD;  Location: [place];  Service: ENT;  Laterality: Bilateral;  . TUBAL LIGATION      FAMILY HISTORY: Family History  Problem Relation Age of Onset  . Depression Mother   . Hypertension Father   . Prostate cancer Father   . Kidney failure Father   . Colon cancer Neg Hx   . Colon polyps Neg Hx     SOCIAL HISTORY: Social History   Socioeconomic History  . Marital status: Divorced    Spouse name: Not on file  . Number of children: Not on file  . Years of education: Not on file  . Highest education level: Not on file  Occupational History  . Not on file  Tobacco Use  . Smoking status: Former Smoker    Packs/day: 3.00    Years: 15.00    Pack years: 45.00    Types: Cigarettes    Quit date: 05/24/2009    Years since quitting: 11.4  . Smokeless tobacco: Never Used  Vaping Use  . Vaping Use: Never used  Substance and Sexual Activity  . Alcohol use: Yes  . Drug use: No  . Sexual activity: Not Currently    Birth control/protection: None, Surgical    Comment: tubal and ablation  Other Topics Concern  . Not on file  Social History Narrative  . Not on file   Social Determinants of Health   Financial Resource Strain: Not on file  Food Insecurity: Not on file  Transportation Needs: Not on file  Physical Activity: Not on file  Stress: Not on file  Social Connections: Not on file  Intimate Partner Violence: Not on file      PHYSICAL EXAM Generalized: Well developed, in no acute distress   Neurological examination  Mentation: Alert oriented to time, place, history taking. Follows all commands speech and language fluent Cranial nerve II-XII:Extraocular movements were full. Facial symmetry noted. uvula tongue midline. Head turning and shoulder shrug  were normal and symmetric. Motor: Good strength throughout subjectively per patient Sensory:  Sensory testing is intact to soft touch on all 4 extremities subjectively per patient Coordination: Cerebellar testing reveals good finger-nose-finger  Gait and station: Patient is able to stand from a seated position. gait is normal.  Reflexes: UTA  DIAGNOSTIC DATA (LABS, IMAGING, TESTING) -  I reviewed patient records, labs, notes, testing and imaging myself where available.  Lab Results  Component Value Date   WBC 5.9 05/01/2019   HGB 14.0 05/01/2019   HCT 40.6 05/01/2019   MCV 90.6 05/01/2019   PLT 264 05/01/2019      Component Value Date/Time   NA 136 07/04/2020 1517   NA 138 12/13/2015 1646   K 4.5 07/04/2020 1517   CL 104 07/04/2020 1517   CO2 24 07/04/2020 1517   GLUCOSE 88 07/04/2020 1517   BUN 7 07/04/2020 1517   BUN 10 12/13/2015 1646   CREATININE 0.80 07/04/2020 1517   CALCIUM 9.0 07/04/2020 1517   PROT 6.1 (L) 05/01/2019 0536   PROT 7.1 12/13/2015 1646   ALBUMIN 3.1 (L) 05/01/2019 0536   ALBUMIN 4.3 12/13/2015 1646   AST 13 (L) 05/01/2019 0536   ALT 14 05/01/2019 0536   ALKPHOS 68 05/01/2019 0536   BILITOT 0.7 05/01/2019 0536   BILITOT 0.5 12/13/2015 1646   GFRNONAA >60 07/04/2020 1517   GFRAA >60 07/04/2020 1517     ASSESSMENT AND PLAN 54 y.o. year old female  has a past medical history of ADHD, Anxiety, Arthritis, Chronic ethmoidal sinusitis (07/2017), Chronic maxillary sinusitis (63/8466), Complication of anesthesia, Concha bullosa (07/2017), Depression, Deviated nasal septum (07/2017), Essential hypertension, GERD (gastroesophageal reflux disease), History of cardiac murmur, Legally blind, and Restless leg. here with:  1.  Restless leg syndrome  --Continue Mirapex 0.125 mg 3 tablets at bedtime --Advised if symptoms worsen or she develops new symptoms she should let us know --Follow-up in 1 year or sooner if needed  I spent 20 minutes of face-to-face and non-face-to-face time with patient.  This included previsit chart review, lab review, study  review, order entry, electronic health record documentation, patient education.    Ward Givens, MSN, NP-C 10/16/2020, 1:04 PM [facility] [address] [place], [place] [address] [phone]  I reviewed the above note and documentation by the Nurse Practitioner and agree with the history, exam, assessment and plan as outlined above. I was available for consultation. Star Age, MD, PhD [facility])  I reviewed the above note and documentation by the Nurse Practitioner and agree with the history, exam, assessment and plan as outlined above. I was available for consultation. Star Age, MD, PhD [facility])

## 2020-10-18 DIAGNOSIS — M2031 Hallux varus (acquired), right foot: Secondary | ICD-10-CM | POA: Diagnosis not present

## 2020-10-18 DIAGNOSIS — S92314K Nondisplaced fracture of first metatarsal bone, right foot, subsequent encounter for fracture with nonunion: Secondary | ICD-10-CM | POA: Diagnosis not present

## 2020-10-27 DIAGNOSIS — B9629 Other Escherichia coli [E. coli] as the cause of diseases classified elsewhere: Secondary | ICD-10-CM | POA: Diagnosis not present

## 2020-10-27 DIAGNOSIS — N390 Urinary tract infection, site not specified: Secondary | ICD-10-CM | POA: Diagnosis not present

## 2020-10-27 DIAGNOSIS — L292 Pruritus vulvae: Secondary | ICD-10-CM | POA: Diagnosis not present

## 2020-11-02 DIAGNOSIS — F331 Major depressive disorder, recurrent, moderate: Secondary | ICD-10-CM | POA: Diagnosis not present

## 2020-11-02 DIAGNOSIS — I10 Essential (primary) hypertension: Secondary | ICD-10-CM | POA: Diagnosis not present

## 2020-11-02 DIAGNOSIS — E669 Obesity, unspecified: Secondary | ICD-10-CM | POA: Diagnosis not present

## 2020-11-02 DIAGNOSIS — Z6834 Body mass index (BMI) 34.0-34.9, adult: Secondary | ICD-10-CM | POA: Diagnosis not present

## 2020-11-30 DIAGNOSIS — F331 Major depressive disorder, recurrent, moderate: Secondary | ICD-10-CM | POA: Diagnosis not present

## 2020-11-30 DIAGNOSIS — E663 Overweight: Secondary | ICD-10-CM | POA: Diagnosis not present

## 2020-11-30 DIAGNOSIS — I10 Essential (primary) hypertension: Secondary | ICD-10-CM | POA: Diagnosis not present

## 2020-11-30 DIAGNOSIS — Z6830 Body mass index (BMI) 30.0-30.9, adult: Secondary | ICD-10-CM | POA: Diagnosis not present

## 2020-11-30 DIAGNOSIS — F39 Unspecified mood [affective] disorder: Secondary | ICD-10-CM | POA: Diagnosis not present

## 2020-12-04 DIAGNOSIS — S92314K Nondisplaced fracture of first metatarsal bone, right foot, subsequent encounter for fracture with nonunion: Secondary | ICD-10-CM | POA: Diagnosis not present

## 2020-12-05 ENCOUNTER — Other Ambulatory Visit: Payer: Self-pay | Admitting: Orthopedic Surgery

## 2020-12-05 DIAGNOSIS — S92314K Nondisplaced fracture of first metatarsal bone, right foot, subsequent encounter for fracture with nonunion: Secondary | ICD-10-CM

## 2020-12-18 ENCOUNTER — Ambulatory Visit
Admission: RE | Admit: 2020-12-18 | Discharge: 2020-12-18 | Disposition: A | Discharge status: Home / Self Care | Payer: PPO | Source: Ambulatory Visit | Attending: Orthopedic Surgery | Admitting: Orthopedic Surgery

## 2020-12-18 DIAGNOSIS — M7731 Calcaneal spur, right foot: Secondary | ICD-10-CM | POA: Diagnosis not present

## 2020-12-18 DIAGNOSIS — Z4789 Encounter for other orthopedic aftercare: Secondary | ICD-10-CM | POA: Diagnosis not present

## 2020-12-18 DIAGNOSIS — S92314K Nondisplaced fracture of first metatarsal bone, right foot, subsequent encounter for fracture with nonunion: Secondary | ICD-10-CM

## 2020-12-21 DIAGNOSIS — H3521 Other non-diabetic proliferative retinopathy, right eye: Secondary | ICD-10-CM | POA: Diagnosis not present

## 2020-12-21 DIAGNOSIS — Z9889 Other specified postprocedural states: Secondary | ICD-10-CM | POA: Diagnosis not present

## 2020-12-21 DIAGNOSIS — Z8669 Personal history of other diseases of the nervous system and sense organs: Secondary | ICD-10-CM | POA: Diagnosis not present

## 2020-12-30 DIAGNOSIS — J019 Acute sinusitis, unspecified: Secondary | ICD-10-CM | POA: Diagnosis not present

## 2021-01-24 DIAGNOSIS — M7751 Other enthesopathy of right foot: Secondary | ICD-10-CM | POA: Diagnosis not present

## 2021-01-24 DIAGNOSIS — E1165 Type 2 diabetes mellitus with hyperglycemia: Secondary | ICD-10-CM | POA: Diagnosis not present

## 2021-01-24 DIAGNOSIS — I10 Essential (primary) hypertension: Secondary | ICD-10-CM | POA: Diagnosis not present

## 2021-02-02 DIAGNOSIS — F39 Unspecified mood [affective] disorder: Secondary | ICD-10-CM | POA: Diagnosis not present

## 2021-02-02 DIAGNOSIS — E663 Overweight: Secondary | ICD-10-CM | POA: Diagnosis not present

## 2021-02-02 DIAGNOSIS — F331 Major depressive disorder, recurrent, moderate: Secondary | ICD-10-CM | POA: Diagnosis not present

## 2021-02-02 DIAGNOSIS — Z6830 Body mass index (BMI) 30.0-30.9, adult: Secondary | ICD-10-CM | POA: Diagnosis not present

## 2021-02-02 DIAGNOSIS — I10 Essential (primary) hypertension: Secondary | ICD-10-CM | POA: Diagnosis not present

## 2021-02-12 ENCOUNTER — Telehealth: Payer: Self-pay | Admitting: Internal Medicine

## 2021-02-12 NOTE — Telephone Encounter (Signed)
PATIENT WOULD LIKE SOMETHING LESS STRONG THAN THE LINZESS SHE IS ON   SHE USES [facility] IN [place]

## 2021-02-13 ENCOUNTER — Telehealth: Payer: Self-pay | Admitting: Internal Medicine

## 2021-02-13 NOTE — Telephone Encounter (Signed)
Spoke to pt.  Said Linzess 145 mcg worked at first.  After 2 weeks or so, gives her diarrhea really bad.  She said that she tried taking it first thing in the morning and not eating until 9:00 but it didn't change the outcome.  She thinks it may be too strong.  Wants to know if we can give her lower dose or change her to something else.

## 2021-02-13 NOTE — Telephone Encounter (Signed)
Noted.  Spoke to pt.  She is aware to come by to pick up samples.

## 2021-02-13 NOTE — Telephone Encounter (Signed)
We can give her samples of Linzess 72 mcg.

## 2021-02-13 NOTE — Telephone Encounter (Signed)
Patient called again regarding her call yesterday.  Needed another medication besides linzess.   Stated she feels "off"

## 2021-02-25 DIAGNOSIS — K219 Gastro-esophageal reflux disease without esophagitis: Secondary | ICD-10-CM | POA: Diagnosis not present

## 2021-02-25 DIAGNOSIS — E1165 Type 2 diabetes mellitus with hyperglycemia: Secondary | ICD-10-CM | POA: Diagnosis not present

## 2021-02-25 DIAGNOSIS — E669 Obesity, unspecified: Secondary | ICD-10-CM | POA: Diagnosis not present

## 2021-02-28 ENCOUNTER — Telehealth: Payer: Self-pay | Admitting: Internal Medicine

## 2021-02-28 NOTE — Telephone Encounter (Signed)
Pt called to see if Roseanne Kaufman, NP could increase the strength of her Linzess 72 mg or call in something similar, but cheaper. Please advise. [phone]

## 2021-03-02 NOTE — Telephone Encounter (Signed)
Returned the pt's call and advised her to take the Linzesss 72 mg over the weekend with food and plenty of fluids. She agreed. The pt also stated she may need to change what she is taking because of the cost and I advised her that she already tried Amitiza and she advised [place] that it did not work . Pt stated she had forgot. But she will call back Monday with a report and we will advise her then after speaking with [name] and she is doing a virtual with [name] for Tuesday at 10:30 am and we need to advise her of this also when she return our call.

## 2021-03-05 NOTE — Progress Notes (Signed)
Primary Care Physician:  Celene Squibb, MD  Primary GI: Dr. Abbey Chatters (previously Dr. Oneida Alar )   Patient Location: Home   Provider Location: [facility] office   Reason for Visit: Follow-up   Persons present on the virtual encounter, with roles: Patient and NP   Total time (minutes) spent on medical discussion: 10 minutes   Due to COVID-19, visit was conducted using virtual method.  Visit was requested by patient.  Virtual Visit via MyChart Video Note Due to COVID-19, visit is conducted virtually and was requested by patient.   I connected with Sara Barnett on 03/06/21 at 10:30 AM EDT by telephone and verified that I am speaking with the correct person using two identifiers.   I discussed the limitations, risks, security and privacy concerns of performing an evaluation and management service by telephone and the availability of in person appointments. I also discussed with the patient that there may be a patient responsible charge related to this service. The patient expressed understanding and agreed to proceed.  Chief Complaint  Patient presents with  . Constipation    W/ bloating. Ran out of linzess samples. Felt it did help a little while taking. Feels may need something little stronger     History of Present Illness:  Sara Barnett is a 55 year old female with history of chronic constipation, previously on Linzess historically with best symptom management.   Now stating Linzess 145 was entirely too strong, with multiple BMs in the morning. linzess 72 mcg was not strong enough. Notes abdominal discomfort and bloating. Would like to try a different agent. She tried Amitiza several years ago without much improvement. Linzess is also Scientist, research (medical) through insurance. Colonoscopy is up-to-date.    Past Medical History:  Diagnosis Date  . ADHD   . Anxiety   . Arthritis   . Chronic ethmoidal sinusitis 07/2017  . Chronic maxillary sinusitis 07/2017  . Complication of anesthesia     Anaphylaxis after Methylprednisolone injection - cardiac arrest/PEA  . 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 Depression   . Deviated nasal septum 07/2017  . Essential hypertension   . GERD (gastroesophageal reflux disease)   . History of cardiac murmur   . Legally blind   . Restless leg      Past Surgical History:  Procedure Laterality Date  . ABLATION ON ENDOMETRIOSIS    . BUNIONECTOMY Right   . BUNIONECTOMY Right 02/09/2020   Procedure: BUNIONECTOMY RIGHT FOOT WITH SESAMOIDECTOMY, LENGTHENING OF RIGHT EXTENSOR HALLUCIS LONGUS TENDON, PLANTAR FASCIA RELEASE OF RIGHT FOOT;  Surgeon: Caprice Beaver, DPM;  Location: [facility];  Service: Podiatry;  Laterality: Right;  . CATARACT EXTRACTION, BILATERAL     age 77 year  . CESAREAN SECTION     x 2  . COLONOSCOPY WITH PROPOFOL N/A 08/25/2018   moderate pancolonic diverticulosis, external and internal hemorrhoids, redundant left colon.   . ENDOSCOPIC CONCHA BULLOSA RESECTION Bilateral 08/25/2017   Procedure: BILATERAL ENDOSCOPIC CONCHA BULLOSA RESECTION;  Surgeon: Leta Baptist, MD;  Location: [place];  Service: ENT;  Laterality: Bilateral;  . ETHMOIDECTOMY Bilateral 08/25/2017   Procedure: BILATERAL TOTAL ETHMOIDECTOMY;  Surgeon: Leta Baptist, MD;  Location: [place];  Service: ENT;  Laterality: Bilateral;  . EYE SURGERY    . HARDWARE REMOVAL Right 07/06/2020   Procedure: Removal of deep implants right 1st metatarsal;  Surgeon: Wylene Simmer, MD;  Location: [place];  Service: Orthopedics;  Laterality: Right;  35min  .  INTRAOCULAR LENS INSERTION Bilateral    age 62s  . LUMBAR FUSION  07/05/2004  . LUMBAR LAMINECTOMY  07/05/2004   L5-S1  . MAXILLARY ANTROSTOMY Bilateral 08/25/2017   Procedure: BILATERAL MAXILLARY ANTROSTOMY WITH TISSUE REMOVAL;  Surgeon: Leta Baptist, MD;  Location: [place];  Service: ENT;  Laterality: Bilateral;  . MEMBRANE PEEL Right 11/06/2011; 08/06/2013  .  NASAL SEPTOPLASTY W/ TURBINOPLASTY Bilateral 08/25/2017   Procedure: NASAL SEPTOPLASTY WITH BILATERAL TURBINATE REDUCTION;  Surgeon: Leta Baptist, MD;  Location: [place];  Service: ENT;  Laterality: Bilateral;  . 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 PARS PLANA VITRECTOMY Right 03/22/2011; 08/21/2011; 12/20/2011; 05/13/2012; 11/20/2012; 05/12/2013; 08/06/2013; 05/17/2016  . PARS PLANA VITRECTOMY W/ SCLERAL BUCKLE Left 04/21/2015  . PLANTAR FASCIA RELEASE Right   . SINUS ENDO W/FUSION Bilateral 08/25/2017   Procedure: ENDOSCOPIC SINUS SURGERY WITH FUSION NAVIGATION;  Surgeon: Leta Baptist, MD;  Location: [place];  Service: ENT;  Laterality: Bilateral;  . TUBAL LIGATION       Current Meds  Medication Sig  . benazepril (LOTENSIN) 40 MG tablet Take 40 mg by mouth daily.  Marland Kitchen buPROPion (WELLBUTRIN XL) 300 MG 24 hr tablet Take 1 tablet (300 mg total) by mouth daily.  . chlorthalidone (HYGROTON) 25 MG tablet Take 25 mg by mouth daily.  . clonazePAM (KLONOPIN) 0.5 MG tablet Take 0.5 mg by mouth at bedtime as needed (To help with restless mind).   . lansoprazole (PREVACID) 30 MG capsule Take 30 mg by mouth daily.   . methocarbamol (ROBAXIN) 500 MG tablet Take 500 mg by mouth as needed.  . phentermine 15 MG capsule Take 30 mg by mouth daily.  . pramipexole (MIRAPEX) 0.125 MG tablet TAKE 3 TABLETS BY MOUTH ONCE DAILY IN THE EVENING 90 TO 120 MINUTES BEFORE BEDTIME  . prednisoLONE acetate (PRED FORTE) 1 % ophthalmic suspension Place 1 drop into the right eye in the morning and at bedtime.      Family History  Problem Relation Age of Onset  . Depression Mother   . Hypertension Father   . Prostate cancer Father   . Kidney failure Father   . Colon cancer Neg Hx   . Colon polyps Neg Hx      Social History   Socioeconomic History  . Marital status: Divorced    Spouse name: Not on file  . Number of children: Not on file  . Years of education: Not on file  . Highest education level: Not on file  Occupational History  . Not on file  Tobacco Use  . Smoking status: Former Smoker    Packs/day: 3.00    Years: 15.00    Pack years: 45.00    Types: Cigarettes    Quit date: 05/24/2009    Years since quitting: 11.7  . Smokeless tobacco: Never Used  Vaping Use  . Vaping Use: Never used  Substance and Sexual Activity  . Alcohol use: Yes  . Drug use: No  . Sexual activity: Not Currently    Birth control/protection: None, Surgical    Comment: tubal and ablation  Other Topics Concern  . Not on file  Social History Narrative  . Not on file   Social Determinants of Health   Financial Resource Strain: Not on file  Food Insecurity: Not on  file  Transportation Needs: Not on file  Physical Activity: Not on file  Stress: Not on file  Social Connections: Not on file       Review of Systems: Gen: Denies fever, chills, anorexia. Denies fatigue, weakness, weight loss.  CV: Denies chest pain, palpitations, syncope, peripheral edema, and claudication. Resp: Denies dyspnea at rest, cough, wheezing, coughing up blood, and pleurisy. GI: see HPI Derm: Denies rash, itching, dry skin Psych: Denies depression, anxiety, memory loss, confusion. No homicidal or suicidal ideation.  Heme: Denies bruising, bleeding, and enlarged lymph nodes.  Observations/Objective: No distress. Unable to perform physical exam due to video encounter. Maintains eye contact. In good spirits.  Assessment and Plan: 55 year old female with chronic constipation, not ideally managed currently.   Previously on Linzess but no longer working as well, as Linzess 145 mcg was too strong and Linzess 72 mcg not strong enough. Although she has tried Amitiza many years ago without ideal results, I would like to try  this again before resorting to another agent.   Will trial Amitiza 24 mcg po BID with food. Return in 3 months. Patient to call if no improvement  Follow Up Instructions:    I discussed the assessment and treatment plan with the patient. The patient was provided an opportunity to ask questions and all were answered. The patient agreed with the plan and demonstrated an understanding of the instructions.   The patient was advised to call back or seek an in-person evaluation if the symptoms worsen or if the condition fails to improve as anticipated.  I provided 10 minutes of face-to-face time during this MyChart Video encounter.  Annitta Needs, PhD, ANP-BC [facility]

## 2021-03-06 ENCOUNTER — Telehealth: Payer: Self-pay | Admitting: *Deleted

## 2021-03-06 ENCOUNTER — Encounter: Payer: Self-pay | Admitting: Gastroenterology

## 2021-03-06 ENCOUNTER — Telehealth (INDEPENDENT_AMBULATORY_CARE_PROVIDER_SITE_OTHER): Payer: PPO | Admitting: Gastroenterology

## 2021-03-06 ENCOUNTER — Telehealth: Payer: Self-pay | Admitting: Gastroenterology

## 2021-03-06 DIAGNOSIS — K5900 Constipation, unspecified: Secondary | ICD-10-CM | POA: Diagnosis not present

## 2021-03-06 MED ORDER — LUBIPROSTONE 24 MCG PO CAPS: 24.0000 ug | ORAL_CAPSULE | Freq: Two times a day (BID) | ORAL | 3 refills | Status: DC

## 2021-03-06 NOTE — Telephone Encounter (Signed)
Pt consented to a virtual visit. 

## 2021-03-06 NOTE — Patient Instructions (Signed)
I have sent in [place] to take in the place of Linzess. This is the strongest dose. Take one gelcap twice a day with food. If needed, you could titrate down to once daily. There is a lower dose if needed. Make sure to take with food to avoid nausea!  We will see you in 3-4 months!  I enjoyed seeing you again today! As you know, I value our relationship and want to provide genuine, compassionate, and quality care. I welcome your feedback. If you receive a survey regarding your visit,  I greatly appreciate you taking time to fill this out. See you next time!  Annitta Needs, PhD, ANP-BC [facility]

## 2021-03-06 NOTE — Telephone Encounter (Signed)
Sara Barnett, you are scheduled for a virtual visit with your provider today.  Just as we do with appointments in the office, we must obtain your consent to participate.  Your consent will be active for this visit and any virtual visit you may have with one of our providers in the next 365 days.  If you have a MyChart account, I can also send a copy of this consent to you electronically.  All virtual visits are billed to your insurance company just like a traditional visit in the office.  As this is a virtual visit, video technology does not allow for your provider to perform a traditional examination.  This may limit your provider's ability to fully assess your condition.  If your provider identifies any concerns that need to be evaluated in person or the need to arrange testing such as labs, EKG, etc, we will make arrangements to do so.  Although advances in technology are sophisticated, we cannot ensure that it will always work on either your end or our end.  If the connection with a video visit is poor, we may have to switch to a telephone visit.  With either a video or telephone visit, we are not always able to ensure that we have a secure connection.   I need to obtain your verbal consent now.   Are you willing to proceed with your visit today?

## 2021-03-06 NOTE — Telephone Encounter (Signed)
Sara Barnett, please arrange routine follow-up for constipation in 3-4 months. Thanks!

## 2021-03-07 DIAGNOSIS — E669 Obesity, unspecified: Secondary | ICD-10-CM | POA: Diagnosis not present

## 2021-03-07 NOTE — Progress Notes (Signed)
Cc'ed to pcp °

## 2021-03-09 NOTE — Telephone Encounter (Signed)
OV made and appt card mailed °

## 2021-03-30 DIAGNOSIS — E785 Hyperlipidemia, unspecified: Secondary | ICD-10-CM | POA: Diagnosis not present

## 2021-04-04 DIAGNOSIS — R7301 Impaired fasting glucose: Secondary | ICD-10-CM | POA: Diagnosis not present

## 2021-04-04 DIAGNOSIS — I8391 Asymptomatic varicose veins of right lower extremity: Secondary | ICD-10-CM | POA: Diagnosis not present

## 2021-04-04 DIAGNOSIS — E669 Obesity, unspecified: Secondary | ICD-10-CM | POA: Diagnosis not present

## 2021-04-04 DIAGNOSIS — I10 Essential (primary) hypertension: Secondary | ICD-10-CM | POA: Diagnosis not present

## 2021-04-04 DIAGNOSIS — J019 Acute sinusitis, unspecified: Secondary | ICD-10-CM | POA: Diagnosis not present

## 2021-04-04 DIAGNOSIS — E785 Hyperlipidemia, unspecified: Secondary | ICD-10-CM | POA: Diagnosis not present

## 2021-04-26 DIAGNOSIS — K219 Gastro-esophageal reflux disease without esophagitis: Secondary | ICD-10-CM | POA: Diagnosis not present

## 2021-04-26 DIAGNOSIS — E1165 Type 2 diabetes mellitus with hyperglycemia: Secondary | ICD-10-CM | POA: Diagnosis not present

## 2021-05-14 ENCOUNTER — Other Ambulatory Visit: Payer: Self-pay

## 2021-05-14 DIAGNOSIS — I83893 Varicose veins of bilateral lower extremities with other complications: Secondary | ICD-10-CM

## 2021-05-21 ENCOUNTER — Encounter (HOSPITAL_COMMUNITY): Payer: PPO

## 2021-05-23 ENCOUNTER — Telehealth: Payer: Self-pay

## 2021-05-23 ENCOUNTER — Telehealth: Payer: Self-pay | Admitting: Internal Medicine

## 2021-05-23 NOTE — Telephone Encounter (Signed)
Pt called stating she need Rx for constipation to be stronger. Wonders if 3 times a day can be done. Went back through the pt's records on 02/13/21 pt stated the Linzess 145 mcg gave her diarrhea after 2 weeks and on 03/28/2020 stated the Linzess 290 mcg were too strong. The pt is inquiring of what to do. She was on also on Linzess 72 mcg.

## 2021-05-23 NOTE — Telephone Encounter (Signed)
error 

## 2021-05-23 NOTE — Telephone Encounter (Signed)
She is on Amitiza 24 mcg

## 2021-05-23 NOTE — Telephone Encounter (Signed)
Pt called asking if her prescription comes any stronger than what she has now or could she take it 3 times a day instead of once a day.  I transferred call to nurse's VM.

## 2021-05-23 NOTE — Telephone Encounter (Signed)
Is she on Amitiza or Linzess?? I sent in [place] in May 2022.

## 2021-05-24 NOTE — Telephone Encounter (Signed)
We can't do three times a day for Amitiza. She is at highest dosing.   She can add Miralax daily as needed. If not helpful, we can try Motegrity 2 mg tablets. Take 1 tablet daily, with or without food. Please provide samples if she would like to do this. Do not take Amitiza and Motegrity at same time.

## 2021-05-24 NOTE — Telephone Encounter (Signed)
Phoned and LMOVM of the pt to return call. 

## 2021-05-25 NOTE — Telephone Encounter (Signed)
Phoned and advised pt of not dong the Amitiza x 3 a day and just adding the Miralax daily as needed. If this does not help then she can get some Motegrity samples but she can't take Amitiza  the pt at the same time. The pt will call back next week to give a report of doing the Miralax with the Amitiza. The pt agreed.

## 2021-06-14 ENCOUNTER — Telehealth: Payer: Self-pay | Admitting: Internal Medicine

## 2021-06-14 ENCOUNTER — Telehealth: Payer: Self-pay

## 2021-06-14 MED ORDER — LINACLOTIDE 290 MCG PO CAPS
290.0000 ug | ORAL_CAPSULE | Freq: Every day | ORAL | 1 refills | Status: DC
Start: 2021-06-14 — End: 2021-07-15

## 2021-06-14 NOTE — Telephone Encounter (Signed)
Pt is complaining of being constipated and not being able to have a bowel movement in 2 days. When she did she had to take amitiza x3 with miralax, metamucil, and use a enema the same day. States her abdomen is protruding to the point where she looks pregnant. States the very top of her abdomen is hard. Wants a visit to be seen (has upcoming foot surgery, hasn't set a date yet). Pt would like to go back on Linzess 290 mcg.

## 2021-06-14 NOTE — Telephone Encounter (Signed)
Prescription for Linzess 290 mcg daily sent to [facility].  Thank you

## 2021-06-14 NOTE — Telephone Encounter (Signed)
Phoned and advised the pt of her Rx being sent to [facility]

## 2021-06-25 DIAGNOSIS — T8484XA Pain due to internal orthopedic prosthetic devices, implants and grafts, initial encounter: Secondary | ICD-10-CM | POA: Diagnosis not present

## 2021-06-25 DIAGNOSIS — M2031 Hallux varus (acquired), right foot: Secondary | ICD-10-CM | POA: Diagnosis not present

## 2021-06-27 ENCOUNTER — Ambulatory Visit: Payer: PPO | Admitting: Physician Assistant

## 2021-06-27 ENCOUNTER — Ambulatory Visit (HOSPITAL_COMMUNITY)
Admission: RE | Admit: 2021-06-27 | Discharge: 2021-06-27 | Disposition: A | Discharge status: Home / Self Care | Payer: PPO | Source: Ambulatory Visit | Attending: Vascular Surgery | Admitting: Vascular Surgery

## 2021-06-27 ENCOUNTER — Other Ambulatory Visit: Payer: Self-pay

## 2021-06-27 VITALS — BP 130/93 | HR 95 | Temp 97.7°F | Resp 20 | Ht 63.0 in | Wt 171.3 lb

## 2021-06-27 DIAGNOSIS — I83893 Varicose veins of bilateral lower extremities with other complications: Secondary | ICD-10-CM | POA: Insufficient documentation

## 2021-06-27 DIAGNOSIS — I83811 Varicose veins of right lower extremities with pain: Secondary | ICD-10-CM

## 2021-06-27 DIAGNOSIS — K219 Gastro-esophageal reflux disease without esophagitis: Secondary | ICD-10-CM | POA: Diagnosis not present

## 2021-06-27 DIAGNOSIS — E1165 Type 2 diabetes mellitus with hyperglycemia: Secondary | ICD-10-CM | POA: Diagnosis not present

## 2021-06-27 NOTE — Progress Notes (Signed)
Requested by:  Celene Squibb, MD [address] [place],  [place] [address]  Reason for consultation: varicose veins of lower extremities   History of Present Illness   Sara Barnett is a 55 y.o. ([birth year]) female who presents for evaluation of varicose vein on right thigh. She says this has become more prominent over past 8 months. She explains that she had one month where it was very painful and she was putting a Coban wrap on it to make it feel better. She says at the time it was a 9/10. Normally it is about a 3/10. She says she has had several friends/ family members tell her she needs to get it looked at so that is why she is here. She has previously had bilateral lower extremity venous ablations approximately 10 years ago at [facility]. She said she never had pain in her legs or swelling but had gone there for her spider veins. She says she had to go back after her right leg was done because it was extremely painful. She had another procedure done at the time but she is not sure what they did. She has been wearing knee high OTC compression stockings on and off since then. She does not regularly elevate her legs. She denies any aching, heaviness, tiredness, throbbing, burning, itching or swelling. She has no history of DVT. She has family history of venous disease in her brother.   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   Depression    Deviated nasal  septum 07/2017   Essential hypertension    GERD (gastroesophageal reflux disease)    History of cardiac murmur    Legally blind    Restless leg     Past Surgical History:  Procedure Laterality Date   ABLATION ON ENDOMETRIOSIS     BUNIONECTOMY Right    BUNIONECTOMY Right 02/09/2020   Procedure: BUNIONECTOMY RIGHT FOOT WITH SESAMOIDECTOMY, LENGTHENING OF RIGHT EXTENSOR HALLUCIS LONGUS TENDON, PLANTAR FASCIA RELEASE OF RIGHT FOOT;  Surgeon: Caprice Beaver, DPM;  Location: [facility];  Service: Podiatry;  Laterality: Right;   CATARACT EXTRACTION, BILATERAL     age 23 year   CESAREAN SECTION     x 2   COLONOSCOPY WITH PROPOFOL N/A 08/25/2018   moderate pancolonic diverticulosis, external and internal hemorrhoids, redundant left colon.    ENDOSCOPIC CONCHA BULLOSA RESECTION Bilateral 08/25/2017   Procedure: BILATERAL ENDOSCOPIC CONCHA BULLOSA RESECTION;  Surgeon: Leta Baptist, MD;  Location: [facility];  Service: ENT;  Laterality: Bilateral;   ETHMOIDECTOMY Bilateral 08/25/2017   Procedure: BILATERAL TOTAL ETHMOIDECTOMY;  Surgeon: Leta Baptist, MD;  Location: [facility];  Service: ENT;  Laterality: Bilateral;   EYE SURGERY     HARDWARE REMOVAL Right 07/06/2020   Procedure: Removal of deep implants right 1st metatarsal;  Surgeon: Wylene Simmer, MD;  Location: [facility];  Service: Orthopedics;  Laterality: Right;  69mn   INTRAOCULAR LENS INSERTION Bilateral  age 54s   LUMBAR FUSION  07/05/2004   LUMBAR LAMINECTOMY  07/05/2004   L5-S1   MAXILLARY ANTROSTOMY Bilateral 08/25/2017   Procedure: BILATERAL MAXILLARY ANTROSTOMY WITH TISSUE REMOVAL;  Surgeon: Leta Baptist, MD;  Location: [facility];  Service: ENT;  Laterality: Bilateral;   MEMBRANE PEEL Right 11/06/2011; 08/06/2013   NASAL SEPTOPLASTY W/ TURBINOPLASTY Bilateral 08/25/2017   Procedure: NASAL SEPTOPLASTY WITH BILATERAL TURBINATE REDUCTION;  Surgeon: Leta Baptist, MD;  Location: [facility];  Service: ENT;  Laterality: Bilateral;   ORIF TOE FRACTURE Right 07/06/2020   Procedure: 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;  Surgeon: Wylene Simmer, MD;  Location: [facility];  Service: Orthopedics;  Laterality: Right;   PARS PLANA VITRECTOMY Right 03/22/2011; 08/21/2011; 12/20/2011; 05/13/2012; 11/20/2012; 05/12/2013; 08/06/2013; 05/17/2016   PARS PLANA VITRECTOMY W/ SCLERAL BUCKLE Left 04/21/2015   PLANTAR FASCIA RELEASE Right    SINUS ENDO W/FUSION Bilateral 08/25/2017   Procedure: ENDOSCOPIC SINUS SURGERY WITH FUSION NAVIGATION;  Surgeon: Leta Baptist, MD;  Location: [facility];  Service: ENT;  Laterality: Bilateral;   TUBAL LIGATION      Social History   Socioeconomic History   Marital status: Divorced    Spouse name: Not on file   Number of children: Not on file   Years of education: Not on file   Highest education level: Not on file  Occupational History   Not on file  Tobacco Use   Smoking status: Former    Packs/day: 3.00    Years: 15.00    Pack years: 45.00    Types: Cigarettes    Quit date: 05/24/2009    Years since quitting: 12.1   Smokeless tobacco: Never  Vaping Use   Vaping Use: Never used  Substance and Sexual Activity   Alcohol use: Yes   Drug use: No   Sexual activity: Not Currently    Birth control/protection: None, Surgical    Comment: tubal and ablation  Other Topics Concern   Not on file  Social History Narrative   Not on file   Social Determinants of Health   Financial Resource Strain: Not on file  Food Insecurity: Not on file  Transportation Needs: Not on file  Physical Activity: Not on file  Stress: Not on file  Social Connections: Not on file  Intimate Partner Violence: Not on file    Family History  Problem Relation Age of Onset   Depression Mother    Hypertension Father    Prostate cancer Father    Kidney failure Father    Colon cancer Neg Hx    Colon polyps Neg  Hx     Current Outpatient Medications  Medication Sig Dispense Refill   benazepril (LOTENSIN) 40 MG tablet Take 40 mg by mouth daily.     buPROPion (WELLBUTRIN XL) 300 MG 24 hr tablet Take 1 tablet (300 mg total) by mouth daily. 30 tablet 0   chlorthalidone (HYGROTON) 25 MG tablet Take 25 mg by mouth daily.     clonazePAM (KLONOPIN) 0.5 MG tablet Take 0.5 mg by mouth at bedtime as needed (To help with restless mind).      docusate sodium (COLACE) 100 MG capsule Take 1 capsule (100 mg total) by mouth 2 (two) times daily. While taking narcotic pain medicine. 30 capsule 0   furosemide (LASIX) 20 MG tablet Take 20 mg by mouth in the morning.     lansoprazole (PREVACID) 30 MG capsule Take 30 mg  by mouth daily.      linaclotide (LINZESS) 290 MCG CAPS capsule Take 1 capsule (290 mcg total) by mouth daily before breakfast. 90 capsule 1   methocarbamol (ROBAXIN) 500 MG tablet Take 500 mg by mouth as needed.     phentermine 15 MG capsule Take 30 mg by mouth daily.     pramipexole (MIRAPEX) 0.125 MG tablet TAKE 3 TABLETS BY MOUTH ONCE DAILY IN THE EVENING 90 TO 120 MINUTES BEFORE BEDTIME 270 tablet 3   prednisoLONE acetate (PRED FORTE) 1 % ophthalmic suspension Place 1 drop into the right eye in the morning and at bedtime.      senna (SENOKOT) 8.6 MG TABS tablet Take 2 tablets (17.2 mg total) by mouth 2 (two) times daily. 30 tablet 0   No current facility-administered medications for this visit.    Allergies  Allergen Reactions   Doxycycline Itching and Other (See Comments)    THROAT SWELLING   Hydrocodone-Acetaminophen Itching and Other (See Comments)    THROAT SWELLING   Methylprednisolone Anaphylaxis and Other (See Comments)    CARDIAC ARREST/PEA    Other Other (See Comments)    Steroids - Unknown drug and strength, stopped patient's heart   Augmentin [Amoxicillin-Pot Clavulanate] Other (See Comments)    "FEELING HOT"; EXTREME SLEEPINESS   Statins Other (See Comments)    JOINT PAIN,  swellling    REVIEW OF SYSTEMS (negative unless checked):   Cardiac:  '[]'$  Chest pain or chest pressure? '[]'$  Shortness of breath upon activity? '[]'$  Shortness of breath when lying flat? '[]'$  Irregular heart rhythm?  Vascular:  '[]'$  Pain in calf, thigh, or hip brought on by walking? '[]'$  Pain in feet at night that wakes you up from your sleep? '[]'$  Blood clot in your veins? '[]'$  Leg swelling?  Pulmonary:  '[]'$  Oxygen at home? '[]'$  Productive cough? '[]'$  Wheezing?  Neurologic:  '[]'$  Sudden weakness in arms or legs? '[]'$  Sudden numbness in arms or legs? '[]'$  Sudden onset of difficult speaking or slurred speech? '[]'$  Temporary loss of vision in one eye? '[]'$  Problems with dizziness?  Gastrointestinal:  '[]'$  Blood in stool? '[]'$  Vomited blood?  Genitourinary:  '[]'$  Burning when urinating? '[]'$  Blood in urine?  Psychiatric:  '[]'$  Major depression  Hematologic:  '[]'$  Bleeding problems? '[]'$  Problems with blood clotting?  Dermatologic:  '[]'$  Rashes or ulcers?  Constitutional:  '[]'$  Fever or chills?  Ear/Nose/Throat:  '[]'$  Change in hearing? '[]'$  Nose bleeds? '[]'$  Sore throat?  Musculoskeletal:  '[]'$  Back pain? '[]'$  Joint pain? '[]'$  Muscle pain?   Physical Examination     Vitals:   06/27/21 1340  BP: (!) 130/93  Pulse: 95  Resp: 20  Temp: 97.7 F (36.5 C)  TempSrc: Temporal  SpO2: 97%  Weight: 171 lb 4.8 oz (77.7 kg)  Height: '5\' 3"'$  (1.6 m)   Body mass index is 30.34 kg/m.  General:  WDWN in NAD; vital signs documented above Gait: Not observed HENT: WNL, normocephalic Pulmonary: normal non-labored breathing , without wheezing Cardiac: regular HR, without  Murmurs without carotid bruit Abdomen:obese Vascular Exam/Pulses:2+ radial pulses, 2+ femoral, popliteal, and pedal pulses bilaterally. Feet warm Extremities: with varicose vein of right anterior thigh, with reticular veins of bilateral legs, without edema, without stasis pigmentation, without lipodermatosclerosis, without ulcers Musculoskeletal: no  muscle wasting or atrophy  Neurologic: A&O X 3;  No focal weakness or paresthesias are detected Psychiatric:  The pt has Normal affect.  Non-invasive Vascular Imaging   BLE Venous Insufficiency Duplex (06/27/21):  RLE:  No DVT and SVT GSV reflux at [facility] and proximal thigh GSV diameter 0.44 No SSV reflux  No deep venous reflux   Medical Decision Making   AJANEE OSWALT is a 55 y.o. female who presents with: RLE chronic venous insufficiency with varicose vein. Duplex shows only superficial venous reflux. No DVT or SVT. No SSV reflux. She is not a candidate for any intervention based on today's duplex. She could have a stab phlebectomy of her varicose vein but this would likely be considered cosmetic. Reassurance was provided to her that this is not limb or life threatening and that she has no DVT.I advised her that if she has recurrence of painful episodes she should use warm compresses and NSAID's as needed.  I recommend continued conservative therapy with compression, elevation, exercise and refraining from prolonged sitting or standing. She will follow up as needed if she has new or worsening symptoms.   Karoline Caldwell, PA-C [facility] Office: [phone]  06/27/2021, 2:19 PM  Clinic MD: [facility]/ [facility] Dock

## 2021-06-28 ENCOUNTER — Telehealth: Payer: Self-pay | Admitting: Internal Medicine

## 2021-06-28 NOTE — Telephone Encounter (Signed)
Pt asked if there's something else similar to Linzess that she can try, because the Linzess prescription is too expensive for her. She uses [facility]. I told her the nurse was leaving early today and it may be tomorrow before its addressed. She agreed. [phone]

## 2021-06-29 NOTE — Telephone Encounter (Signed)
Phoned and LMOVM 

## 2021-07-03 ENCOUNTER — Telehealth: Payer: Self-pay | Admitting: Internal Medicine

## 2021-07-03 NOTE — Telephone Encounter (Signed)
Returning call. [phone]

## 2021-07-03 NOTE — Telephone Encounter (Signed)
Yes we do I'm giving 4 boxes of Trulance. [Honed and LMOVM for the pt regarding samples being left up front for her to pick up and call back with a report of about 2 weeks

## 2021-07-03 NOTE — Telephone Encounter (Signed)
See the other phone note 

## 2021-07-03 NOTE — Telephone Encounter (Signed)
Pt phoned stating she cannot afford the $90.00 for her Linzess even after insurance. I asked the pt what was she taking now and she stated Miralax and Amitiza x 2 a day. She states she needs something else with the same effects of Linzess 290 mcg. (Pt has been on all the Linzess, metamucil, enema's, dulcolax. ? Motergrity. Her insurance calls for Linzess, Trulance, Lubiprostone, methylnaltrexone, naloxegal, and Naldemedine. I have downloaded 2 different coupons with instructions for her to follow to sign up for program. Right now the Miralax and Amitiza isn't giving her the satisfaction she is looking for. She is having bowel movements but still not good enough for her. Please advise what to next for the pt.

## 2021-07-03 NOTE — Telephone Encounter (Signed)
Dena, do we have Trulance samples here? It is similar in action to Pelican. We could provide samples of that and see how it works for her.

## 2021-07-12 ENCOUNTER — Telehealth: Payer: Self-pay | Admitting: Internal Medicine

## 2021-07-12 NOTE — Telephone Encounter (Signed)
Returned the pt's call and advised her that Sara Barnett was out of town and returns Tuesday. [name] is only one the pt has seen and this will be her 1st Rx for Trulance). I advised her that her Rx would be sent in upon her return. Pt expressed understanding of this.

## 2021-07-12 NOTE — Telephone Encounter (Signed)
Pt said the trulance samples were working and she has 3 tablets left. She asked for a prescription to be called into [facility]

## 2021-07-15 ENCOUNTER — Other Ambulatory Visit: Payer: Self-pay | Admitting: Gastroenterology

## 2021-07-15 DIAGNOSIS — K5900 Constipation, unspecified: Secondary | ICD-10-CM

## 2021-07-15 MED ORDER — TRULANCE 3 MG PO TABS: 3.0000 mg | ORAL_TABLET | Freq: Every day | ORAL | 3 refills | Status: DC

## 2021-07-15 NOTE — Telephone Encounter (Signed)
Rx for Trulance '3mg'$  daily sent to pharmacy.

## 2021-07-16 NOTE — Telephone Encounter (Signed)
Phoned and LMOVM for the pt to return call if needed but message left regarding Trulance being sent to pharmacy.

## 2021-07-17 ENCOUNTER — Telehealth: Payer: Self-pay | Admitting: Gastroenterology

## 2021-07-17 NOTE — Telephone Encounter (Signed)
PA for trulance 3 mg tablet was approved through 10/27/2021. Approval letter will be scanned into the patient's chart.

## 2021-07-18 ENCOUNTER — Telehealth: Payer: Self-pay

## 2021-07-18 NOTE — Telephone Encounter (Signed)
noted 

## 2021-07-18 NOTE — Telephone Encounter (Signed)
Spoke with the pt about which agent worked best for her( without thinking of the cost) and she stated Linzess 145 mcg (but she stated she will just take them every other day.) along with the Miralax everyday. 3 boxes of Linzess 145 mcg left up front for the pt. Advised the pt that this appt scheduled for her is very important that she keeps it because pt will likely need to be referred to tertiary care. Pt expressed understanding of all of this.

## 2021-07-18 NOTE — Telephone Encounter (Signed)
error 

## 2021-07-18 NOTE — Telephone Encounter (Signed)
Routing to Roseanne Kaufman, NP, who has been following this patient, for recommendations. I was covering for [name] while she was out last week.

## 2021-07-18 NOTE — Telephone Encounter (Signed)
Stacey: please arrange office visit.  Dena: Multiple agents tried.   In May 2022 when last seen, Linzess 145 mcg was too strong and Linzess 72 mcg not strong enough. Amitiza not strong enough.   Cost has been an issue.   Not a candidate for the other options covered by insurance.  I recommend choosing the agent that worked best for her (Linzess?) and taking Miralax as needed. We can provide samples if possible.   We likely need to refer to tertiary care. Will discuss at office visit.

## 2021-07-18 NOTE — Telephone Encounter (Signed)
Sara Barnett, The pt called back yesterday stating the Trulance was too expensive (even after PA). Pt has had all 3 Linzess, Amitiza, senokot, colace, metamucil, miralax, dulcolax, motergrity and enema's sometimes on a daily basis.   The only thing left according to her insurance is Methylnaltrexone, naloxegal, and Naldenedino.  I know that a few of these were too expensive for her she stated (some were $70.00). pt was given coupons to use. I contacted the Health Dept this morning--no new pt' taken until January if they get the help they need.

## 2021-07-31 ENCOUNTER — Ambulatory Visit: Payer: PPO | Admitting: Gastroenterology

## 2021-08-06 ENCOUNTER — Telehealth: Payer: Self-pay | Admitting: Gastroenterology

## 2021-08-06 NOTE — Telephone Encounter (Signed)
Patient said that the linzess samples worked and she would like a prescription sent to [facility] in [place]

## 2021-08-06 NOTE — Telephone Encounter (Signed)
Pt called to say Linzess 145 mcg has worked for her and she wants a Rx sent  to her pharmacy.

## 2021-08-07 MED ORDER — LINACLOTIDE 145 MCG PO CAPS: 145.0000 ug | ORAL_CAPSULE | Freq: Every day | ORAL | 3 refills | Status: DC

## 2021-08-07 NOTE — Telephone Encounter (Signed)
Completed.

## 2021-08-07 NOTE — Addendum Note (Signed)
Addended by: Annitta Needs on: 08/07/2021 04:27 PM   Modules accepted: Orders

## 2021-08-27 DIAGNOSIS — K219 Gastro-esophageal reflux disease without esophagitis: Secondary | ICD-10-CM | POA: Diagnosis not present

## 2021-08-27 DIAGNOSIS — E1165 Type 2 diabetes mellitus with hyperglycemia: Secondary | ICD-10-CM | POA: Diagnosis not present

## 2021-09-19 DIAGNOSIS — R404 Transient alteration of awareness: Secondary | ICD-10-CM | POA: Diagnosis not present

## 2021-09-19 DIAGNOSIS — I10 Essential (primary) hypertension: Secondary | ICD-10-CM | POA: Diagnosis not present

## 2021-10-16 ENCOUNTER — Telehealth (INDEPENDENT_AMBULATORY_CARE_PROVIDER_SITE_OTHER): Payer: PPO | Admitting: Adult Health

## 2021-10-16 DIAGNOSIS — G2581 Restless legs syndrome: Secondary | ICD-10-CM | POA: Diagnosis not present

## 2021-10-16 MED ORDER — PRAMIPEXOLE DIHYDROCHLORIDE 0.125 MG PO TABS: ORAL_TABLET | ORAL | 3 refills | Status: AC

## 2021-10-16 NOTE — Progress Notes (Signed)
PATIENT: Sara Barnett DOB: 08-Feb-1966  REASON FOR VISIT: follow up HISTORY FROM: patient  Virtual Visit via Video Note  I connected with Sara Barnett on 10/16/21 at  1:30 PM EST by a video enabled telemedicine application located remotely at [facility] and verified that I am speaking with the correct person using two identifiers who was located at their own home.   I discussed the limitations of evaluation and management by telemedicine and the availability of in person appointments. The patient expressed understanding and agreed to proceed.   PATIENT: Sara Barnett DOB: 01-Nov-1965  REASON FOR VISIT: follow up HISTORY FROM: patient  HISTORY OF PRESENT ILLNESS: Today 10/16/21:  Sara Barnett is a 55 year old female with a history of restless leg syndrome.  She returns today for follow-up visit.  She reports that Mirapex is working fairly well for her.  She typically takes it right at bedtime so it takes a while for her to fall asleep.  She has not tried taking it earlier in the evening.  The patient states that she would like to come off of medication and try herbal supplements.  The patient also states that she had back surgery many years ago and feels that she may have tweaked her back since then and that may be causing her symptoms.  She would like repeat imaging.  Returns today for an evaluation.  12/20/21Ms. Barnett is a 55 year old female with a history of restless leg syndrome.  She returns today for virtual visit.  She reports that Mirapex is working well for her.  She continues to take 3 tablets at bedtime.  She states that she has been having foot pain and is seeing a Psychologist, sport and exercise.  Apparently a previous surgery resulted in further injury per the patient.  She states that she has a lot of stress with her younger daughter but other than that she is doing well.  10/14/19:Sara Barnett is a 55 year old female with a history of restless leg  syndrome.  She returns today for virtual visit.  She reports that Mirapex is working well for her.  She states that restless legs is not affecting her ability to go to sleep or stay asleep.  She tolerates the medication well.  She reports that she continues to have dizziness.  She reports that she advised her PCP and they reduced her blood pressure medicine but this has continued.  She reports that the dizziness occurs with position changes particularly when she is bending down or squatting down.  She states that she will feel dizzy and most the time her vision goes black but she does not pass out.  She states that this may last for couple seconds.  She returns today for a follow-up.  HISTORY 04/13/19:   Sara Barnett is a 55 year old female with a history of restless leg syndrome.  She reports that she has been taking Mirapex 3 tablets at bedtime.  She reports that this has been beneficial for her restless leg syndrome.  She states that last week she injured her back.  It is unclear how she injured her back.  She states that she has been having some back pain but has not followed with her primary care.  She reports that in the past she has had a spinal fusion.  She also states that his she bends down and stands up she feels lightheaded.  She states this is been going on for quite some time.  She has not mentioned  this to her PCP either.  She joins me today for virtual visit.  REVIEW OF SYSTEMS: Out of a complete 14 system review of symptoms, the patient complains only of the following symptoms, and all other reviewed systems are negative.  See HPI  ALLERGIES: Allergies  Allergen Reactions   Doxycycline Itching and Other (See Comments)    THROAT SWELLING   Hydrocodone-Acetaminophen Itching and Other (See Comments)    THROAT SWELLING   Methylprednisolone Anaphylaxis and Other (See Comments)    CARDIAC ARREST/PEA    Other Other (See Comments)    Steroids - Unknown drug and strength, stopped  patient's heart   Augmentin [Amoxicillin-Pot Clavulanate] Other (See Comments)    "FEELING HOT"; EXTREME SLEEPINESS   Statins Other (See Comments)    JOINT PAIN, swellling    HOME MEDICATIONS: Outpatient Medications Prior to Visit  Medication Sig Dispense Refill   benazepril (LOTENSIN) 40 MG tablet Take 40 mg by mouth daily.     buPROPion (WELLBUTRIN XL) 300 MG 24 hr tablet Take 1 tablet (300 mg total) by mouth daily. 30 tablet 0   chlorthalidone (HYGROTON) 25 MG tablet Take 25 mg by mouth daily.     clonazePAM (KLONOPIN) 0.5 MG tablet Take 0.5 mg by mouth at bedtime as needed (To help with restless mind).      docusate sodium (COLACE) 100 MG capsule Take 1 capsule (100 mg total) by mouth 2 (two) times daily. While taking narcotic pain medicine. 30 capsule 0   furosemide (LASIX) 20 MG tablet Take 20 mg by mouth in the morning.     lansoprazole (PREVACID) 30 MG capsule Take 30 mg by mouth daily.      linaclotide (LINZESS) 145 MCG CAPS capsule Take 1 capsule (145 mcg total) by mouth daily before breakfast. 90 capsule 3   methocarbamol (ROBAXIN) 500 MG tablet Take 500 mg by mouth as needed.     phentermine 15 MG capsule Take 30 mg by mouth daily.     Plecanatide (TRULANCE) 3 MG TABS Take 3 mg by mouth daily. 30 tablet 3   pramipexole (MIRAPEX) 0.125 MG tablet TAKE 3 TABLETS BY MOUTH ONCE DAILY IN THE EVENING 90 TO 120 MINUTES BEFORE BEDTIME 270 tablet 3   prednisoLONE acetate (PRED FORTE) 1 % ophthalmic suspension Place 1 drop into the right eye in the morning and at bedtime.      senna (SENOKOT) 8.6 MG TABS tablet Take 2 tablets (17.2 mg total) by mouth 2 (two) times daily. 30 tablet 0   No facility-administered medications prior to visit.    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   Depression    Deviated nasal septum 07/2017   Essential hypertension    GERD (gastroesophageal reflux disease)    History of cardiac murmur    Legally blind    Restless leg     PAST SURGICAL HISTORY: Past Surgical History:  Procedure Laterality Date   ABLATION ON ENDOMETRIOSIS     BUNIONECTOMY Right    BUNIONECTOMY Right 02/09/2020   Procedure: BUNIONECTOMY RIGHT FOOT WITH SESAMOIDECTOMY, LENGTHENING OF RIGHT EXTENSOR HALLUCIS LONGUS TENDON, PLANTAR FASCIA RELEASE OF RIGHT FOOT;  Surgeon: Caprice Beaver, DPM;  Location: [facility];  Service:  Podiatry;  Laterality: Right;   CATARACT EXTRACTION, BILATERAL     age 24 year   CESAREAN SECTION     x 2   COLONOSCOPY WITH PROPOFOL N/A 08/25/2018   moderate pancolonic diverticulosis, external and internal hemorrhoids, redundant left colon.    ENDOSCOPIC CONCHA BULLOSA RESECTION Bilateral 08/25/2017   Procedure: BILATERAL ENDOSCOPIC CONCHA BULLOSA RESECTION;  Surgeon: Leta Baptist, MD;  Location: [place];  Service: ENT;  Laterality: Bilateral;   ETHMOIDECTOMY Bilateral 08/25/2017   Procedure: BILATERAL TOTAL ETHMOIDECTOMY;  Surgeon: Leta Baptist, MD;  Location: [place];  Service: ENT;  Laterality: Bilateral;   EYE SURGERY     HARDWARE REMOVAL Right 07/06/2020   Procedure: Removal of deep implants right 1st metatarsal;  Surgeon: Wylene Simmer, MD;  Location: [place];  Service: Orthopedics;  Laterality: Right;  44min   INTRAOCULAR LENS INSERTION Bilateral    age 40s   LUMBAR FUSION  07/05/2004   LUMBAR LAMINECTOMY  07/05/2004   L5-S1   MAXILLARY ANTROSTOMY Bilateral 08/25/2017   Procedure: BILATERAL MAXILLARY ANTROSTOMY WITH TISSUE REMOVAL;  Surgeon: Leta Baptist, MD;  Location: [place];  Service: ENT;  Laterality: Bilateral;   MEMBRANE PEEL Right 11/06/2011; 08/06/2013   NASAL SEPTOPLASTY W/ TURBINOPLASTY Bilateral  08/25/2017   Procedure: NASAL SEPTOPLASTY WITH BILATERAL TURBINATE REDUCTION;  Surgeon: Leta Baptist, MD;  Location: [place];  Service: ENT;  Laterality: Bilateral;   ORIF TOE FRACTURE Right 07/06/2020   Procedure: 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;  Surgeon: Wylene Simmer, MD;  Location: [place];  Service: Orthopedics;  Laterality: Right;   PARS PLANA VITRECTOMY Right 03/22/2011; 08/21/2011; 12/20/2011; 05/13/2012; 11/20/2012; 05/12/2013; 08/06/2013; 05/17/2016   PARS PLANA VITRECTOMY W/ SCLERAL BUCKLE Left 04/21/2015   PLANTAR FASCIA RELEASE Right    SINUS ENDO W/FUSION Bilateral 08/25/2017   Procedure: ENDOSCOPIC SINUS SURGERY WITH FUSION NAVIGATION;  Surgeon: Leta Baptist, MD;  Location: [place];  Service: ENT;  Laterality: Bilateral;   TUBAL LIGATION      FAMILY HISTORY: Family History  Problem Relation Age of Onset   Depression Mother    Hypertension Father    Prostate cancer Father    Kidney failure Father    Colon cancer Neg Hx    Colon polyps Neg Hx     SOCIAL HISTORY: Social History   Socioeconomic History   Marital status: Divorced    Spouse name: Not on file   Number of children: Not on file   Years of education: Not on file   Highest education level: Not on file  Occupational History   Not on file  Tobacco Use   Smoking status: Former    Packs/day: 3.00    Years: 15.00    Pack years: 45.00    Types: Cigarettes    Quit date: 05/24/2009    Years since quitting: 12.4   Smokeless tobacco: Never  Vaping Use   Vaping Use: Never used  Substance and Sexual Activity   Alcohol use: Yes   Drug use: No   Sexual activity: Not Currently    Birth control/protection: None, Surgical    Comment: tubal and ablation  Other Topics Concern   Not on file  Social History Narrative   Not on file   Social Determinants of Health   Financial Resource Strain: Not on file  Food Insecurity: Not on file   Transportation Needs: Not on file  Physical Activity: Not on  file  Stress: Not on file  Social Connections: Not on file  Intimate Partner Violence: Not on file      PHYSICAL EXAM Generalized: Well developed, in no acute distress   Neurological examination  Mentation: Alert oriented to time, place, history taking. Follows all commands speech and language fluent Cranial nerve II-XII:Extraocular movements were full.  Head turning and shoulder shrug  were normal and symmetric.  Reflexes: UTA  DIAGNOSTIC DATA (LABS, IMAGING, TESTING) - I reviewed patient records, labs, notes, testing and imaging myself where available.  Lab Results  Component Value Date   WBC 5.9 05/01/2019   HGB 14.0 05/01/2019   HCT 40.6 05/01/2019   MCV 90.6 05/01/2019   PLT 264 05/01/2019      Component Value Date/Time   NA 136 07/04/2020 1517   NA 138 12/13/2015 1646   K 4.5 07/04/2020 1517   CL 104 07/04/2020 1517   CO2 24 07/04/2020 1517   GLUCOSE 88 07/04/2020 1517   BUN 7 07/04/2020 1517   BUN 10 12/13/2015 1646   CREATININE 0.80 07/04/2020 1517   CALCIUM 9.0 07/04/2020 1517   PROT 6.1 (L) 05/01/2019 0536   PROT 7.1 12/13/2015 1646   ALBUMIN 3.1 (L) 05/01/2019 0536   ALBUMIN 4.3 12/13/2015 1646   AST 13 (L) 05/01/2019 0536   ALT 14 05/01/2019 0536   ALKPHOS 68 05/01/2019 0536   BILITOT 0.7 05/01/2019 0536   BILITOT 0.5 12/13/2015 1646   GFRNONAA >60 07/04/2020 1517   GFRAA >60 07/04/2020 1517     ASSESSMENT AND PLAN 55 y.o. year old female  has a past medical history of ADHD, Anxiety, Arthritis, Chronic ethmoidal sinusitis (07/2017), Chronic maxillary sinusitis (11/5425), Complication of anesthesia, Concha bullosa (07/2017), Depression, Deviated nasal septum (07/2017), Essential hypertension, GERD (gastroesophageal reflux disease), History of cardiac murmur, Legally blind, and Restless leg. here with:  1.  Restless leg syndrome  --Continue Mirapex 0.125 mg 3 tablets at bedtime --  Patient feels that an issue with her back may be causing her symptoms.  I advised that she needs to come in for an office visit for evaluation before I can order any imaging.  She was scheduled at the end of January. --Advised if symptoms worsen or she develops new symptoms she should let us know      Ward Givens, MSN, NP-C 10/16/2021, 1:35 PM [facility] [address], [place], [place] [address] [phone]

## 2021-10-30 ENCOUNTER — Encounter: Payer: Self-pay | Admitting: Emergency Medicine

## 2021-10-30 ENCOUNTER — Ambulatory Visit (INDEPENDENT_AMBULATORY_CARE_PROVIDER_SITE_OTHER): Payer: PPO

## 2021-10-30 ENCOUNTER — Other Ambulatory Visit: Payer: Self-pay

## 2021-10-30 ENCOUNTER — Ambulatory Visit
Admission: EM | Admit: 2021-10-30 | Discharge: 2021-10-30 | Disposition: A | Discharge status: Home / Self Care | Payer: PPO | Attending: Urgent Care | Admitting: Urgent Care

## 2021-10-30 DIAGNOSIS — R059 Cough, unspecified: Secondary | ICD-10-CM

## 2021-10-30 DIAGNOSIS — R0789 Other chest pain: Secondary | ICD-10-CM | POA: Diagnosis not present

## 2021-10-30 DIAGNOSIS — R0981 Nasal congestion: Secondary | ICD-10-CM

## 2021-10-30 DIAGNOSIS — R0989 Other specified symptoms and signs involving the circulatory and respiratory systems: Secondary | ICD-10-CM

## 2021-10-30 DIAGNOSIS — R053 Chronic cough: Secondary | ICD-10-CM | POA: Diagnosis not present

## 2021-10-30 DIAGNOSIS — R079 Chest pain, unspecified: Secondary | ICD-10-CM | POA: Diagnosis not present

## 2021-10-30 DIAGNOSIS — J329 Chronic sinusitis, unspecified: Secondary | ICD-10-CM | POA: Diagnosis not present

## 2021-10-30 MED ORDER — MONTELUKAST SODIUM 10 MG PO TABS: 10.0000 mg | ORAL_TABLET | Freq: Every day | ORAL | 0 refills | Status: AC

## 2021-10-30 MED ORDER — CEFDINIR 300 MG PO CAPS: 300.0000 mg | ORAL_CAPSULE | Freq: Two times a day (BID) | ORAL | 0 refills | Status: DC

## 2021-10-30 MED ORDER — CETIRIZINE HCL 10 MG PO TABS: 10.0000 mg | ORAL_TABLET | Freq: Every day | ORAL | 0 refills | Status: DC

## 2021-10-30 MED ORDER — PREDNISONE 20 MG PO TABS: ORAL_TABLET | ORAL | 0 refills | Status: DC

## 2021-10-30 NOTE — ED Provider Notes (Signed)
[place]   MRN: [id number] DOB: [birth year]  Subjective:   Sara Barnett is a 55 y.o. female presenting for 2 to 25-month history of persistent coughing, chest pain, chest congestion, malaise and fatigue.  Initially she felt like this was due to coming off of Wellbutrin.  She does not want to restart this medication.  However, she is also had persistent sinus congestion, sinus pressure, sinus pain, sore throat.  She has a history of persistent sinus infections.  Would like to make sure that she does not have pneumonia.  No history of asthma. Patient is a smoker, has remote history.   No current facility-administered medications for this encounter.  Current Outpatient Medications:    benazepril (LOTENSIN) 40 MG tablet, Take 40 mg by mouth daily., Disp: , Rfl:    buPROPion (WELLBUTRIN XL) 300 MG 24 hr tablet, Take 1 tablet (300 mg total) by mouth daily., Disp: 30 tablet, Rfl: 0   chlorthalidone (HYGROTON) 25 MG tablet, Take 25 mg by mouth daily., Disp: , Rfl:    clonazePAM (KLONOPIN) 0.5 MG tablet, Take 0.5 mg by mouth at bedtime as needed (To help with restless mind). , Disp: , Rfl:    docusate sodium (COLACE) 100 MG capsule, Take 1 capsule (100 mg total) by mouth 2 (two) times daily. While taking narcotic pain medicine., Disp: 30 capsule, Rfl: 0   furosemide (LASIX) 20 MG tablet, Take 20 mg by mouth in the morning., Disp: , Rfl:    lansoprazole (PREVACID) 30 MG capsule, Take 30 mg by mouth daily. , Disp: , Rfl:    linaclotide (LINZESS) 145 MCG CAPS capsule, Take 1 capsule (145 mcg total) by mouth daily before breakfast., Disp: 90 capsule, Rfl: 3   methocarbamol (ROBAXIN) 500 MG tablet, Take 500 mg by mouth as needed., Disp: , Rfl:    phentermine 15 MG capsule, Take 30 mg by mouth daily., Disp: , Rfl:    Plecanatide (TRULANCE) 3 MG TABS, Take 3 mg by mouth daily., Disp: 30 tablet, Rfl: 3   pramipexole (MIRAPEX) 0.125 MG tablet, TAKE 3 TABLETS BY MOUTH ONCE DAILY IN THE  EVENING 90 TO 120 MINUTES BEFORE BEDTIME, Disp: 270 tablet, Rfl: 3   prednisoLONE acetate (PRED FORTE) 1 % ophthalmic suspension, Place 1 drop into the right eye in the morning and at bedtime. , Disp: , Rfl:    senna (SENOKOT) 8.6 MG TABS tablet, Take 2 tablets (17.2 mg total) by mouth 2 (two) times daily., Disp: 30 tablet, Rfl: 0   Allergies  Allergen Reactions   Doxycycline Itching and Other (See Comments)    THROAT SWELLING   Hydrocodone-Acetaminophen Itching and Other (See Comments)    THROAT SWELLING   Methylprednisolone Anaphylaxis and Other (See Comments)    CARDIAC ARREST/PEA    Other Other (See Comments)    Steroids - Unknown drug and strength, stopped patient's heart   Augmentin [Amoxicillin-Pot Clavulanate] Other (See Comments)    "FEELING HOT"; EXTREME SLEEPINESS   Statins Other (See Comments)    JOINT PAIN, swellling    Past Medical History:  Diagnosis Date   ADHD    Anxiety    Arthritis    Chronic ethmoidal sinusitis 07/2017   Chronic maxillary sinusitis 08/9146   Complication of anesthesia    Anaphylaxis after Methylprednisolone injection - cardiac arrest/PEA   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   Depression    Deviated nasal septum 07/2017   Essential hypertension    GERD (gastroesophageal reflux disease)  History of cardiac murmur    Legally blind    Restless leg      Past Surgical History:  Procedure Laterality Date   ABLATION ON ENDOMETRIOSIS     BUNIONECTOMY Right    BUNIONECTOMY Right 02/09/2020   Procedure: BUNIONECTOMY RIGHT FOOT WITH SESAMOIDECTOMY, LENGTHENING OF RIGHT EXTENSOR HALLUCIS LONGUS TENDON, PLANTAR FASCIA RELEASE OF RIGHT FOOT;  Surgeon: Caprice Beaver, DPM;  Location: [facility];  Service: Podiatry;  Laterality: Right;   CATARACT EXTRACTION, BILATERAL     age 50 year   CESAREAN SECTION     x 2   COLONOSCOPY WITH PROPOFOL N/A 08/25/2018   moderate pancolonic diverticulosis, external and internal hemorrhoids, redundant left  colon.    ENDOSCOPIC CONCHA BULLOSA RESECTION Bilateral 08/25/2017   Procedure: BILATERAL ENDOSCOPIC CONCHA BULLOSA RESECTION;  Surgeon: Leta Baptist, MD;  Location: [place];  Service: ENT;  Laterality: Bilateral;   ETHMOIDECTOMY Bilateral 08/25/2017   Procedure: BILATERAL TOTAL ETHMOIDECTOMY;  Surgeon: Leta Baptist, MD;  Location: [place];  Service: ENT;  Laterality: Bilateral;   EYE SURGERY     HARDWARE REMOVAL Right 07/06/2020   Procedure: Removal of deep implants right 1st metatarsal;  Surgeon: Wylene Simmer, MD;  Location: [place];  Service: Orthopedics;  Laterality: Right;  49min   INTRAOCULAR LENS INSERTION Bilateral    age 70s   LUMBAR FUSION  07/05/2004   LUMBAR LAMINECTOMY  07/05/2004   L5-S1   MAXILLARY ANTROSTOMY Bilateral 08/25/2017   Procedure: BILATERAL MAXILLARY ANTROSTOMY WITH TISSUE REMOVAL;  Surgeon: Leta Baptist, MD;  Location: [place];  Service: ENT;  Laterality: Bilateral;   MEMBRANE PEEL Right 11/06/2011; 08/06/2013   NASAL SEPTOPLASTY W/ TURBINOPLASTY Bilateral 08/25/2017   Procedure: NASAL SEPTOPLASTY WITH BILATERAL TURBINATE REDUCTION;  Surgeon: Leta Baptist, MD;  Location: [place];  Service: ENT;  Laterality: Bilateral;   ORIF TOE FRACTURE Right 07/06/2020   Procedure: 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;  Surgeon: Wylene Simmer, MD;  Location: [place];  Service: Orthopedics;  Laterality: Right;   PARS PLANA VITRECTOMY Right 03/22/2011; 08/21/2011; 12/20/2011; 05/13/2012; 11/20/2012; 05/12/2013; 08/06/2013; 05/17/2016   PARS PLANA VITRECTOMY W/ SCLERAL BUCKLE Left 04/21/2015   PLANTAR FASCIA RELEASE Right    SINUS ENDO W/FUSION Bilateral 08/25/2017   Procedure: ENDOSCOPIC SINUS SURGERY WITH FUSION NAVIGATION;  Surgeon: Leta Baptist, MD;  Location: [place];  Service: ENT;  Laterality: Bilateral;   TUBAL LIGATION      Family History  Problem  Relation Age of Onset   Depression Mother    Hypertension Father    Prostate cancer Father    Kidney failure Father    Colon cancer Neg Hx    Colon polyps Neg Hx     Social History   Tobacco Use   Smoking status: Former    Packs/day: 3.00    Years: 15.00    Pack years: 45.00    Types: Cigarettes    Quit date: 05/24/2009    Years since quitting: 12.4   Smokeless tobacco: Never  Vaping Use   Vaping Use: Never used  Substance Use Topics   Alcohol use: Yes   Drug use: No    ROS   Objective:   Vitals: BP 130/84 (BP Location: Right Arm)    Pulse 78    Temp 98.5 F (36.9 C) (Oral)    Resp 18    SpO2 95%   Physical Exam Constitutional:  General: She is not in acute distress.    Appearance: Normal appearance. She is well-developed. She is not ill-appearing, toxic-appearing or diaphoretic.  HENT:     Head: Normocephalic and atraumatic.     Nose: Nose normal.     Mouth/Throat:     Mouth: Mucous membranes are moist.  Eyes:     Extraocular Movements: Extraocular movements intact.     Pupils: Pupils are equal, round, and reactive to light.  Cardiovascular:     Rate and Rhythm: Normal rate and regular rhythm.     Pulses: Normal pulses.     Heart sounds: Normal heart sounds. No murmur heard.   No friction rub. No gallop.  Pulmonary:     Effort: Pulmonary effort is normal. No respiratory distress.     Breath sounds: No stridor. No wheezing, rhonchi or rales.     Comments: Decreased breath sounds. Skin:    General: Skin is warm and dry.     Findings: No rash.  Neurological:     Mental Status: She is alert and oriented to person, place, and time.  Psychiatric:        Mood and Affect: Mood normal.        Behavior: Behavior normal.        Thought Content: Thought content normal.    DG Chest 2 View  Result Date: 10/30/2021 CLINICAL DATA:  Cough, chest pain, chest congestion. EXAM: CHEST - 2 VIEW COMPARISON:  Chest x-ray 04/24/2019. FINDINGS: The heart size and  mediastinal contours are within normal limits. Both lungs are clear. The visualized skeletal structures are unremarkable. IMPRESSION: No active cardiopulmonary disease. Electronically Signed   By: Ronney Asters M.D.   On: 10/30/2021 16:14     Assessment and Plan :   PDMP not reviewed this encounter.  1. Chronic recurrent sinusitis   2. Chronic cough   3. Chest congestion   4. Atypical chest pain   5. Sinus congestion    Chest x-ray is negative.  Given the timeline of her illness, deferred respiratory testing.  Recommended managing for acute on chronic recurrent sinusitis with cefdinir.  We discussed her medication allergies and she was agreeable to an oral prednisone course. Counseled patient on potential for adverse effects with medications prescribed/recommended today, ER and return-to-clinic precautions discussed, patient verbalized understanding.    Jaynee Eagles, [place] 10/30/21 1727

## 2021-10-30 NOTE — ED Triage Notes (Signed)
Chest congestion, nasal congestion, sore throat x 2.5 months.  States she felt she could not breath good last night.  States she has not had her wellbutrin x 3 months, and does not want to take this medication again.

## 2021-11-27 ENCOUNTER — Ambulatory Visit (INDEPENDENT_AMBULATORY_CARE_PROVIDER_SITE_OTHER): Payer: PPO

## 2021-11-27 ENCOUNTER — Encounter: Payer: Self-pay | Admitting: Emergency Medicine

## 2021-11-27 ENCOUNTER — Other Ambulatory Visit: Payer: Self-pay

## 2021-11-27 ENCOUNTER — Encounter: Payer: Self-pay | Admitting: Adult Health

## 2021-11-27 ENCOUNTER — Ambulatory Visit
Admission: EM | Admit: 2021-11-27 | Discharge: 2021-11-27 | Disposition: A | Discharge status: Home / Self Care | Payer: PPO | Attending: Emergency Medicine | Admitting: Emergency Medicine

## 2021-11-27 ENCOUNTER — Ambulatory Visit: Payer: PPO | Admitting: Adult Health

## 2021-11-27 DIAGNOSIS — M533 Sacrococcygeal disorders, not elsewhere classified: Secondary | ICD-10-CM

## 2021-11-27 DIAGNOSIS — M545 Low back pain, unspecified: Secondary | ICD-10-CM

## 2021-11-27 DIAGNOSIS — J069 Acute upper respiratory infection, unspecified: Secondary | ICD-10-CM | POA: Diagnosis not present

## 2021-11-27 DIAGNOSIS — M47816 Spondylosis without myelopathy or radiculopathy, lumbar region: Secondary | ICD-10-CM | POA: Diagnosis not present

## 2021-11-27 DIAGNOSIS — M4326 Fusion of spine, lumbar region: Secondary | ICD-10-CM | POA: Diagnosis not present

## 2021-11-27 DIAGNOSIS — G8929 Other chronic pain: Secondary | ICD-10-CM

## 2021-11-27 MED ORDER — TIZANIDINE HCL 4 MG PO TABS: 4.0000 mg | ORAL_TABLET | Freq: Three times a day (TID) | ORAL | 0 refills | Status: DC | PRN

## 2021-11-27 MED ORDER — IBUPROFEN 600 MG PO TABS: 600.0000 mg | ORAL_TABLET | Freq: Four times a day (QID) | ORAL | 0 refills | Status: DC | PRN

## 2021-11-27 NOTE — ED Provider Notes (Signed)
HPI  SUBJECTIVE:  Sara Barnett is a 56 y.o. female who presents with acute exacerbation of chronic back pain starting 2 weeks ago.  She states that she was evaluated by her neurologist today and was told to come in for imaging.  She states that the pain is bilateral, located in the lower back, radiating to both of her hips.  It is dull, achy, sharp, burning and constant.  She reports decreased strength in both of her legs, but no full leg weakness.  No change in her baseline numbness or tingling.  No trauma to the area, fall.  No urinary, fecal incontinence, saddle anesthesia, urinary retention, unintentional weight loss, fevers.  She has been taking an unknown old muscle relaxant at night, Klonopin, ibuprofen 600 mg and ice.  The muscle relaxant, Klonopin helps.  Symptoms are worse with turning over in bed, carrying heavy objects and with any movement.  She has a past medical history of spinal fusion 18 years ago and is worried about orthopedic hardware failure.  She also has a history of hypertension.  No history of diabetes, chronic kidney disease.  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   Depression    Deviated nasal septum 07/2017   Essential hypertension    GERD (gastroesophageal reflux disease)    History of cardiac murmur    Legally blind    Restless leg     Past Surgical History:  Procedure Laterality Date   ABLATION ON ENDOMETRIOSIS     BUNIONECTOMY Right    BUNIONECTOMY Right 02/09/2020   Procedure: BUNIONECTOMY RIGHT FOOT WITH SESAMOIDECTOMY, LENGTHENING OF RIGHT EXTENSOR HALLUCIS LONGUS TENDON, PLANTAR FASCIA RELEASE OF RIGHT FOOT;  Surgeon: Caprice Beaver, DPM;  Location: [facility];  Service: Podiatry;  Laterality: Right;    CATARACT EXTRACTION, BILATERAL     age 79 year   CESAREAN SECTION     x 2   COLONOSCOPY WITH PROPOFOL N/A 08/25/2018   moderate pancolonic diverticulosis, external and internal hemorrhoids, redundant left colon.    ENDOSCOPIC CONCHA BULLOSA RESECTION Bilateral 08/25/2017   Procedure: BILATERAL ENDOSCOPIC CONCHA BULLOSA RESECTION;  Surgeon: Leta Baptist, MD;  Location: [place];  Service: ENT;  Laterality: Bilateral;   ETHMOIDECTOMY Bilateral 08/25/2017   Procedure: BILATERAL TOTAL ETHMOIDECTOMY;  Surgeon: Leta Baptist, MD;  Location: [place];  Service: ENT;  Laterality: Bilateral;   EYE SURGERY     HARDWARE REMOVAL Right 07/06/2020   Procedure: Removal of deep implants right 1st metatarsal;  Surgeon: Wylene Simmer, MD;  Location: [place];  Service: Orthopedics;  Laterality: Right;  27min   INTRAOCULAR LENS INSERTION Bilateral    age 57s   LUMBAR FUSION  07/05/2004   LUMBAR LAMINECTOMY  07/05/2004   L5-S1   MAXILLARY ANTROSTOMY Bilateral 08/25/2017   Procedure: BILATERAL MAXILLARY ANTROSTOMY WITH TISSUE REMOVAL;  Surgeon: Leta Baptist, MD;  Location: [place];  Service: ENT;  Laterality: Bilateral;   MEMBRANE PEEL Right 11/06/2011; 08/06/2013   NASAL SEPTOPLASTY W/ TURBINOPLASTY Bilateral 08/25/2017   Procedure: NASAL SEPTOPLASTY WITH BILATERAL TURBINATE REDUCTION;  Surgeon: Leta Baptist, MD;  Location: [place];  Service: ENT;  Laterality: Bilateral;   ORIF TOE FRACTURE Right 07/06/2020  Procedure: 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;  Surgeon: Wylene Simmer, MD;  Location: [place];  Service: Orthopedics;  Laterality: Right;   PARS PLANA VITRECTOMY Right 03/22/2011; 08/21/2011; 12/20/2011; 05/13/2012; 11/20/2012; 05/12/2013; 08/06/2013; 05/17/2016   PARS PLANA VITRECTOMY W/ SCLERAL BUCKLE Left 04/21/2015   PLANTAR FASCIA RELEASE Right    SINUS ENDO W/FUSION Bilateral 08/25/2017    Procedure: ENDOSCOPIC SINUS SURGERY WITH FUSION NAVIGATION;  Surgeon: Leta Baptist, MD;  Location: [place];  Service: ENT;  Laterality: Bilateral;   TUBAL LIGATION      Family History  Problem Relation Age of Onset   Depression Mother    Hypertension Father    Prostate cancer Father    Kidney failure Father    Colon cancer Neg Hx    Colon polyps Neg Hx     Social History   Tobacco Use   Smoking status: Former    Packs/day: 3.00    Years: 15.00    Pack years: 45.00    Types: Cigarettes    Quit date: 05/24/2009    Years since quitting: 12.5   Smokeless tobacco: Never  Vaping Use   Vaping Use: Never used  Substance Use Topics   Alcohol use: Yes   Drug use: No    No current facility-administered medications for this encounter.  Current Outpatient Medications:    ibuprofen (ADVIL) 600 MG tablet, Take 1 tablet (600 mg total) by mouth every 6 (six) hours as needed., Disp: 30 tablet, Rfl: 0   tiZANidine (ZANAFLEX) 4 MG tablet, Take 1 tablet (4 mg total) by mouth every 8 (eight) hours as needed for muscle spasms., Disp: 30 tablet, Rfl: 0   benazepril (LOTENSIN) 40 MG tablet, Take 40 mg by mouth daily., Disp: , Rfl:    buPROPion (WELLBUTRIN XL) 300 MG 24 hr tablet, Take 1 tablet (300 mg total) by mouth daily., Disp: 30 tablet, Rfl: 0   cetirizine (ZYRTEC ALLERGY) 10 MG tablet, Take 1 tablet (10 mg total) by mouth daily., Disp: 90 tablet, Rfl: 0   chlorthalidone (HYGROTON) 25 MG tablet, Take 25 mg by mouth daily., Disp: , Rfl:    clonazePAM (KLONOPIN) 0.5 MG tablet, Take 0.5 mg by mouth at bedtime as needed (To help with restless mind). , Disp: , Rfl:    docusate sodium (COLACE) 100 MG capsule, Take 1 capsule (100 mg total) by mouth 2 (two) times daily. While taking narcotic pain medicine., Disp: 30 capsule, Rfl: 0   furosemide (LASIX) 20 MG tablet, Take 20 mg by mouth in the morning., Disp: , Rfl:    lansoprazole (PREVACID) 30 MG capsule, Take 30 mg by mouth daily. ,  Disp: , Rfl:    linaclotide (LINZESS) 145 MCG CAPS capsule, Take 1 capsule (145 mcg total) by mouth daily before breakfast., Disp: 90 capsule, Rfl: 3   montelukast (SINGULAIR) 10 MG tablet, Take 1 tablet (10 mg total) by mouth at bedtime., Disp: 90 tablet, Rfl: 0   phentermine 15 MG capsule, Take 30 mg by mouth daily., Disp: , Rfl:    Plecanatide (TRULANCE) 3 MG TABS, Take 3 mg by mouth daily., Disp: 30 tablet, Rfl: 3   pramipexole (MIRAPEX) 0.125 MG tablet, TAKE 3 TABLETS BY MOUTH ONCE DAILY IN THE EVENING 90 TO 120 MINUTES BEFORE BEDTIME, Disp: 270 tablet, Rfl: 3   prednisoLONE acetate (PRED FORTE) 1 % ophthalmic suspension, Place 1 drop into the right eye in the morning and at bedtime. , Disp: , Rfl:  senna (SENOKOT) 8.6 MG TABS tablet, Take 2 tablets (17.2 mg total) by mouth 2 (two) times daily., Disp: 30 tablet, Rfl: 0  Allergies  Allergen Reactions   Doxycycline Itching and Other (See Comments)    THROAT SWELLING   Hydrocodone-Acetaminophen Itching and Other (See Comments)    THROAT SWELLING   Methylprednisolone Anaphylaxis and Other (See Comments)    CARDIAC ARREST/PEA    Other Other (See Comments)    Steroids - Unknown drug and strength, stopped patient's heart   Augmentin [Amoxicillin-Pot Clavulanate] Other (See Comments)    "FEELING HOT"; EXTREME SLEEPINESS   Statins Other (See Comments)    JOINT PAIN, swellling     ROS  As noted in HPI.   Physical Exam  BP 133/84 (BP Location: Right Arm)    Pulse 88    Temp 98 F (36.7 C) (Oral)    Resp 20    SpO2 98%   Constitutional: Well developed, well nourished, no acute distress Eyes:  EOMI, conjunctiva normal bilaterally HENT: Normocephalic, atraumatic,mucus membranes moist Respiratory: Normal inspiratory effort Cardiovascular: Normal rate GI: nondistended. No suprapubic tenderness skin: No rash, skin intact Musculoskeletal: No paralumbar tenderness, no muscle spasm.  Positive lumbar tenderness.  Positive tenderness left  SI joint.  Bilateral lower extremities nontender, baseline ROM with intact PT pulses,  No pain with int/ext rotation flex/extension hips bilaterally. SLR neg bilaterally. Sensation baseline light touch bilaterally for Pt, DTR's symmetric and intact bilaterally KJ, Motor symmetric bilateral 5/5 hip flexion, quadriceps, hamstrings, EHL, foot dorsiflexion, foot plantarflexion, gait somewhat antalgic but without apparent new ataxia. Neurologic: Alert & oriented x 3, no focal neuro deficits Psychiatric: Speech and behavior appropriate   ED Course   Medications - No data to display  Orders Placed This Encounter  Procedures   DG Lumbar Spine Complete    Standing Status:   Standing    Number of Occurrences:   1    Order Specific Question:   Reason for Exam (SYMPTOM  OR DIAGNOSIS REQUIRED)    Answer:   2 weeks worsening low back pain.  Status post spinal fusion 18 years ago.  Rule out acute changes.    Order Specific Question:   Is patient pregnant?    Answer:   No   DG Si Joints    Standing Status:   Standing    Number of Occurrences:   1    Order Specific Question:   Reason for Exam (SYMPTOM  OR DIAGNOSIS REQUIRED)    Answer:   2 weeks worsening low back pain.  Status post spinal fusion 18 years ago.  Rule out acute changes.    No results found for this or any previous visit (from the past 24 hour(s)). DG Lumbar Spine Complete  Result Date: 11/27/2021 CLINICAL DATA:  Worsening low back pain for 2 weeks, previous lumbar fusion EXAM: LUMBAR SPINE - COMPLETE 4+ VIEW COMPARISON:  07/05/2004 FINDINGS: Frontal, bilateral oblique, lateral views of the lumbar spine are obtained. There are 5 non-rib-bearing lumbar type vertebral bodies, with mild left convex scoliosis centered at L4. Postsurgical changes are seen from discectomy and posterior fusion at the L5/S1 level, with bony fusion across the disc space and facet joints. There is multilevel lumbar spondylosis, most pronounced at the L4-5 level. No  acute displaced fracture. Sacroiliac joints are unremarkable. IMPRESSION: 1. L5/S1 discectomy and posterior fusion. No evidence of orthopedic hardware failure. 2. No acute fracture. 3. Mild left convex scoliosis, with multilevel spondylosis greatest at L4-5. Electronically Signed  By: Randa Ngo M.D.   On: 11/27/2021 20:58    ED Clinical Impression  1. Acute exacerbation of chronic low back pain   2. Sacroiliac joint dysfunction of left side    ED Assessment/Plan  Patient sent in for imaging.  Will image L-spine to rule out any acute changes and SI joint as she has tenderness there.  She states that she has follow-up with her neurologist on Tuesday, and that they also take care of neurosurgical issues.  Patient reports cardiac arrest with steroids, so I am sending home with Zanaflex, Tylenol/ibuprofen, heat or ice.  Advised to follow-up with Dr. Nelva Bush at [facility] if necessary.  Reviewed imaging independently.  Mild left convex scoliosis at L4.  Multilevel spondylosis greatest at L4-L5.  Fusion L5/S1.  No evidence of orthopedic hardware failure.  No acute fracture.  See radiology report for full details.  No acute changes on x-ray.  Patient with arthritis.  Plan as above.  Discussed imaging, medical decision-making, and plan for follow-up with the patient.   Patient agrees with plan.   Meds ordered this encounter  Medications   ibuprofen (ADVIL) 600 MG tablet    Sig: Take 1 tablet (600 mg total) by mouth every 6 (six) hours as needed.    Dispense:  30 tablet    Refill:  0   tiZANidine (ZANAFLEX) 4 MG tablet    Sig: Take 1 tablet (4 mg total) by mouth every 8 (eight) hours as needed for muscle spasms.    Dispense:  30 tablet    Refill:  0    *This clinic note was created using [employer]. Therefore, there may be occasional mistakes despite careful proofreading.  ?     Melynda Ripple, MD 11/28/21 [phone]

## 2021-11-27 NOTE — Discharge Instructions (Addendum)
There is no evidence of any acute changes on x-ray.  The orthopedic hardware seems to be fine.  You have multilevel arthritis.  Take 600 mg of ibuprofen combined with 1000 mg of Tylenol together 3-4 times a day as needed for pain.  Zanaflex for muscle spasms.  Heat, gentle stretching.  Deep tissue massage may be helpful.  Follow-up with Dr. Nelva Bush, spine specialist at [facility], if necessary.

## 2021-11-27 NOTE — ED Triage Notes (Signed)
Spinal fusion 18 years ago.  Pain in lower back and bilateral hip area that has gotten worse over the past week.

## 2021-12-11 ENCOUNTER — Ambulatory Visit: Payer: PPO | Admitting: Adult Health

## 2021-12-12 ENCOUNTER — Ambulatory Visit: Payer: PPO | Admitting: Gastroenterology

## 2021-12-19 DIAGNOSIS — R7303 Prediabetes: Secondary | ICD-10-CM | POA: Diagnosis not present

## 2021-12-19 DIAGNOSIS — E669 Obesity, unspecified: Secondary | ICD-10-CM | POA: Diagnosis not present

## 2021-12-19 DIAGNOSIS — M25572 Pain in left ankle and joints of left foot: Secondary | ICD-10-CM | POA: Diagnosis not present

## 2021-12-19 DIAGNOSIS — Z6832 Body mass index (BMI) 32.0-32.9, adult: Secondary | ICD-10-CM | POA: Diagnosis not present

## 2021-12-19 DIAGNOSIS — F411 Generalized anxiety disorder: Secondary | ICD-10-CM | POA: Diagnosis not present

## 2022-01-02 DIAGNOSIS — M1712 Unilateral primary osteoarthritis, left knee: Secondary | ICD-10-CM | POA: Diagnosis not present

## 2022-01-02 DIAGNOSIS — M170 Bilateral primary osteoarthritis of knee: Secondary | ICD-10-CM | POA: Diagnosis not present

## 2022-01-02 DIAGNOSIS — M25561 Pain in right knee: Secondary | ICD-10-CM | POA: Diagnosis not present

## 2022-01-07 DIAGNOSIS — Z0001 Encounter for general adult medical examination with abnormal findings: Secondary | ICD-10-CM | POA: Diagnosis not present

## 2022-01-07 DIAGNOSIS — E785 Hyperlipidemia, unspecified: Secondary | ICD-10-CM | POA: Diagnosis not present

## 2022-01-07 DIAGNOSIS — M25551 Pain in right hip: Secondary | ICD-10-CM | POA: Diagnosis not present

## 2022-01-07 DIAGNOSIS — R7301 Impaired fasting glucose: Secondary | ICD-10-CM | POA: Diagnosis not present

## 2022-01-07 DIAGNOSIS — M545 Low back pain, unspecified: Secondary | ICD-10-CM | POA: Diagnosis not present

## 2022-01-07 DIAGNOSIS — I10 Essential (primary) hypertension: Secondary | ICD-10-CM | POA: Diagnosis not present

## 2022-01-07 DIAGNOSIS — I8391 Asymptomatic varicose veins of right lower extremity: Secondary | ICD-10-CM | POA: Diagnosis not present

## 2022-01-07 DIAGNOSIS — Z6832 Body mass index (BMI) 32.0-32.9, adult: Secondary | ICD-10-CM | POA: Diagnosis not present

## 2022-01-07 DIAGNOSIS — E669 Obesity, unspecified: Secondary | ICD-10-CM | POA: Diagnosis not present

## 2022-01-09 DIAGNOSIS — M170 Bilateral primary osteoarthritis of knee: Secondary | ICD-10-CM | POA: Diagnosis not present

## 2022-01-16 DIAGNOSIS — M170 Bilateral primary osteoarthritis of knee: Secondary | ICD-10-CM | POA: Diagnosis not present

## 2022-01-17 DIAGNOSIS — G8929 Other chronic pain: Secondary | ICD-10-CM | POA: Diagnosis not present

## 2022-01-17 DIAGNOSIS — M545 Low back pain, unspecified: Secondary | ICD-10-CM | POA: Diagnosis not present

## 2022-01-24 DIAGNOSIS — M545 Low back pain, unspecified: Secondary | ICD-10-CM | POA: Diagnosis not present

## 2022-01-24 DIAGNOSIS — M4317 Spondylolisthesis, lumbosacral region: Secondary | ICD-10-CM | POA: Diagnosis not present

## 2022-01-24 DIAGNOSIS — M4316 Spondylolisthesis, lumbar region: Secondary | ICD-10-CM | POA: Diagnosis not present

## 2022-02-07 DIAGNOSIS — J069 Acute upper respiratory infection, unspecified: Secondary | ICD-10-CM | POA: Diagnosis not present

## 2022-02-07 DIAGNOSIS — E6609 Other obesity due to excess calories: Secondary | ICD-10-CM | POA: Diagnosis not present

## 2022-02-07 DIAGNOSIS — Z6831 Body mass index (BMI) 31.0-31.9, adult: Secondary | ICD-10-CM | POA: Diagnosis not present

## 2022-02-13 DIAGNOSIS — M5136 Other intervertebral disc degeneration, lumbar region: Secondary | ICD-10-CM | POA: Diagnosis not present

## 2022-02-13 DIAGNOSIS — M48061 Spinal stenosis, lumbar region without neurogenic claudication: Secondary | ICD-10-CM | POA: Diagnosis not present

## 2022-02-13 DIAGNOSIS — Z6833 Body mass index (BMI) 33.0-33.9, adult: Secondary | ICD-10-CM | POA: Diagnosis not present

## 2022-03-06 DIAGNOSIS — R0982 Postnasal drip: Secondary | ICD-10-CM | POA: Diagnosis not present

## 2022-03-06 DIAGNOSIS — R0981 Nasal congestion: Secondary | ICD-10-CM | POA: Diagnosis not present

## 2022-03-06 DIAGNOSIS — R5383 Other fatigue: Secondary | ICD-10-CM | POA: Diagnosis not present

## 2022-03-06 DIAGNOSIS — E6609 Other obesity due to excess calories: Secondary | ICD-10-CM | POA: Diagnosis not present

## 2022-03-06 DIAGNOSIS — J069 Acute upper respiratory infection, unspecified: Secondary | ICD-10-CM | POA: Diagnosis not present

## 2022-03-07 DIAGNOSIS — M19071 Primary osteoarthritis, right ankle and foot: Secondary | ICD-10-CM | POA: Diagnosis not present

## 2022-03-07 DIAGNOSIS — Q666 Other congenital valgus deformities of feet: Secondary | ICD-10-CM | POA: Diagnosis not present

## 2022-03-07 DIAGNOSIS — T8484XA Pain due to internal orthopedic prosthetic devices, implants and grafts, initial encounter: Secondary | ICD-10-CM | POA: Diagnosis not present

## 2022-03-07 DIAGNOSIS — M2031 Hallux varus (acquired), right foot: Secondary | ICD-10-CM | POA: Diagnosis not present

## 2022-03-07 DIAGNOSIS — M19072 Primary osteoarthritis, left ankle and foot: Secondary | ICD-10-CM | POA: Diagnosis not present

## 2022-03-07 DIAGNOSIS — M2011 Hallux valgus (acquired), right foot: Secondary | ICD-10-CM | POA: Diagnosis not present

## 2022-03-07 DIAGNOSIS — M79671 Pain in right foot: Secondary | ICD-10-CM | POA: Diagnosis not present

## 2022-03-26 ENCOUNTER — Ambulatory Visit (INDEPENDENT_AMBULATORY_CARE_PROVIDER_SITE_OTHER): Payer: PPO | Admitting: Gastroenterology

## 2022-03-26 ENCOUNTER — Encounter: Payer: Self-pay | Admitting: Gastroenterology

## 2022-03-26 VITALS — BP 114/72 | HR 98 | Temp 97.3°F | Ht 62.0 in | Wt 178.6 lb

## 2022-03-26 DIAGNOSIS — K219 Gastro-esophageal reflux disease without esophagitis: Secondary | ICD-10-CM | POA: Diagnosis not present

## 2022-03-26 DIAGNOSIS — K5900 Constipation, unspecified: Secondary | ICD-10-CM | POA: Diagnosis not present

## 2022-03-26 MED ORDER — LANSOPRAZOLE 15 MG PO CPDR: 15.0000 mg | DELAYED_RELEASE_CAPSULE | ORAL | 1 refills | Status: DC

## 2022-03-26 NOTE — Patient Instructions (Addendum)
As we discussed that when she began taking lansoprazole 30 mg every other day.  On the days that you do not take your 30 mg you may take your 15 mg.  Do this for about 4 to 6 weeks and then begin taking lansoprazole 15 mg daily and monitor for symptoms.  If symptoms are not well controlled after being on 15 mg daily for 2 to 3 weeks you may resume taking 30 mg daily.  If symptoms or not well controlled and you want to take famotidine 10 mg for breakthrough symptoms that is also fine.  For your constipation want you to increase her exercise and water intake is much as possible.  You may continue to take magnesium citrate capsules as well, following directions on the bottle.  When you are taking these capsules you should drink 8 to 16 ounces of water with it.  I also recommend for you to eat a high-fiber diet, or supplement your diet with Benefiber, 2 teaspoons daily for 2 weeks, then increase to twice daily.  Magnesium powder is also fine but DO NOT take with the capsules. CALM magnesium, garden of life magnesium, etc.   If you experience any of the symptoms with increased severity, please stop taking the magnesium and contact your PCP:  Muscle weakness. Fatigue. Nausea and vomiting. Trouble breathing.  It was a pleasure to see you today. I want to create trusting relationships with patients. If you receive a survey regarding your visit,  I greatly appreciate you taking time to fill this out on paper or through your MyChart. I value your feedback.  Venetia Night, MSN, FNP-BC, AGACNP-BC [facility]

## 2022-03-26 NOTE — Progress Notes (Signed)
GI Office Note    Referring Provider: Celene Squibb, MD Primary Care Physician:  Celene Squibb, MD Primary GI: Dr. Abbey Chatters  Date:  03/26/2022  ID:  Sara Barnett, DOB 04-10-1966, MRN [id number]   Chief Complaint   Follow up constipation  History of Present Illness  Sara Barnett is a 56 y.o. female presenting today with a history of  Anxiety, depression, HTN, GERD, ADHD, arthritis, presenting today with complaint of constipation and GERD.   Last colonoscopy October 2019 -moderate diverticulosis in the entire examined colon, external and internal hemorrhoids, redundant left colon.  Last seen by office in May 2022 via virtual visit: She continued to have constipation, stated Linzess 145 mcg was too strong and 72 mcg was not strong enough.  She was advised to trial Amitiza 24 mcg p.o. twice daily with food and return in 3 months.  She has not had follow-up visit since.  Many telephone notes in the chart regarding ineffectiveness of Linzess, Amitiza, and inability to afford Trulance although it seemed to work well for her.  Today: Constipation - Has taken linzess and amitiza without much relief as well as Trulance. Does have a problem with insurance coverage.  Only having about 2 bowel movements a week.  She has tried suppositories. Wants to stay away from chemical medications if at all possible. Saw something about magnesium and started taking that and it is helping some maybe but has only been a few days. She reports she feels like her rectum is stretched out. She reports she sometimes needs to use a gloved hand to stimulate herself down there to go. Taking pill form of magnesium. Talked about powder forms.    GERD - on Prevacid. She states she just learned about the long term effects of the PPI. She wants to get off of it. She wants to start taking it every other day and alternating with 15 mg every other day for 2 months and then decrease to 15 mg daily.   Reports she has not been  exercising as much. She states she had a bunion surgery that did not go well about 3 years ago and then went to a new doctor and had it repaired. Has spasms on the bottom of her foot. Has to wear a brace on her foot.    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   Depression    Deviated nasal septum 07/2017   Essential hypertension    GERD (gastroesophageal reflux disease)    History of cardiac murmur    Legally blind    Restless leg     Past Surgical History:  Procedure Laterality Date   ABLATION ON ENDOMETRIOSIS     BUNIONECTOMY Right    BUNIONECTOMY Right 02/09/2020   Procedure: BUNIONECTOMY RIGHT FOOT WITH SESAMOIDECTOMY, LENGTHENING OF RIGHT EXTENSOR HALLUCIS LONGUS TENDON, PLANTAR FASCIA RELEASE OF RIGHT FOOT;  Surgeon: Caprice Beaver, DPM;  Location: [facility];  Service: Podiatry;  Laterality: Right;   CATARACT EXTRACTION, BILATERAL     age 65 year   CESAREAN SECTION     x 2   COLONOSCOPY WITH PROPOFOL N/A 08/25/2018   moderate pancolonic diverticulosis, external and internal hemorrhoids, redundant left colon.  ENDOSCOPIC CONCHA BULLOSA RESECTION Bilateral 08/25/2017   Procedure: BILATERAL ENDOSCOPIC CONCHA BULLOSA RESECTION;  Surgeon: Leta Baptist, MD;  Location: [place];  Service: ENT;  Laterality: Bilateral;   ETHMOIDECTOMY Bilateral 08/25/2017   Procedure: BILATERAL TOTAL ETHMOIDECTOMY;  Surgeon: Leta Baptist, MD;  Location: [place];  Service: ENT;  Laterality: Bilateral;   EYE SURGERY     HARDWARE REMOVAL Right 07/06/2020   Procedure: Removal of deep implants right 1st metatarsal;  Surgeon: Wylene Simmer, MD;  Location: [place];  Service: Orthopedics;  Laterality: Right;  12mn    INTRAOCULAR LENS INSERTION Bilateral    age [age]  LUMBAR FUSION  07/05/2004   LUMBAR LAMINECTOMY  07/05/2004   L5-S1   MAXILLARY ANTROSTOMY Bilateral 08/25/2017   Procedure: BILATERAL MAXILLARY ANTROSTOMY WITH TISSUE REMOVAL;  Surgeon: TLeta Baptist MD;  [place] [facility]  Service: ENT;  Laterality: Bilateral;   MEMBRANE PEEL Right 11/06/2011; 08/06/2013   NASAL SEPTOPLASTY W/ TURBINOPLASTY Bilateral 08/25/2017   Procedure: NASAL SEPTOPLASTY WITH BILATERAL TURBINATE REDUCTION;  Surgeon: TLeta Baptist MD;  [place]  Service: E[place];  Laterality: Bilateral;   ORIF TOE FRACTURE Right 07/06/2020   Procedure: 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;  Surgeon: HWylene Simmer MD;  [place] [facility]  Service: Orthopedics;  Laterality: Right;   PARS PLANA VITRECTOMY Right 03/22/2011; 08/21/2011; 12/20/2011; 05/13/2012; 11/20/2012; 05/12/2013; 08/06/2013; 05/17/2016   PARS PLANA VITRECTOMY W/ SCLERAL BUCKLE Left 04/21/2015   PLANTAR FASCIA RELEASE Right    SINUS ENDO W/FUSION Bilateral 08/25/2017   Procedure: ENDOSCOPIC SINUS SURGERY WITH FUSION NAVIGATION;  Surgeon: TLeta Baptist MD;  [place] [facility]  Service: E[place];  Laterality: Bilateral;   TUBAL LIGATION      Current Outpatient Medications  Medication Sig Dispense Refill   benazepril (LOTENSIN) 40 MG tablet Take 40 mg by mouth daily.     chlorthalidone (HYGROTON) 25 MG tablet Take 25 mg by mouth daily.     clonazePAM (KLONOPIN) 0.5 MG tablet Take 0.5 mg by mouth at bedtime as needed (To help with restless mind).      lansoprazole (PREVACID) 15 MG capsule Take 1 capsule (15 mg total) by mouth every other day. 45 capsule 1   lansoprazole (PREVACID) 30 MG capsule Take 30 mg by mouth every other day.     montelukast (SINGULAIR) 10 MG tablet Take 1 tablet (10 mg total) by mouth at bedtime. 90 tablet 0   phentermine 15 MG capsule Take 30 mg by mouth daily.      pramipexole (MIRAPEX) 0.125 MG tablet TAKE 3 TABLETS BY MOUTH ONCE DAILY IN THE EVENING 90 TO 120 MINUTES BEFORE BEDTIME 270 tablet 3   prednisoLONE acetate (PRED FORTE) 1 % ophthalmic suspension Place 1 drop into the right eye in the morning and at bedtime.      docusate sodium (COLACE) 100 MG capsule Take 1 capsule (100 mg total) by mouth 2 (two) times daily. While taking narcotic pain medicine. (Patient not taking: Reported on 03/26/2022) 30 capsule 0   No current facility-administered medications for this visit.    Allergies as of 03/26/2022 - Review Complete 03/26/2022  Allergen Reaction Noted   Doxycycline Itching and Other (See Comments) 12/18/2011   Hydrocodone-acetaminophen Itching and Other (See Comments) 12/13/2015   Methylprednisolone Anaphylaxis and Other (See Comments) 12/13/2015   Other Other (See Comments)    Augmentin [amoxicillin-pot clavulanate] Other (See Comments) 08/18/2017   Statins  Other (See Comments) 12/18/2011    Family History  Problem Relation Age of Onset   Depression Mother    Hypertension Father    Prostate cancer Father    Kidney failure Father    Colon cancer Neg Hx    Colon polyps Neg Hx     Social History   Socioeconomic History   Marital status: Divorced    Spouse name: Not on file   Number of children: Not on file   Years of education: Not on file   Highest education level: Not on file  Occupational History   Not on file  Tobacco Use   Smoking status: Former    Packs/day: 3.00    Years: 15.00    Pack years: 45.00    Types: Cigarettes    Quit date: 05/24/2009    Years since quitting: 12.8   Smokeless tobacco: Never  Vaping Use   Vaping Use: Never used  Substance and Sexual Activity   Alcohol use: Yes   Drug use: No   Sexual activity: Not Currently    Birth control/protection: None, Surgical    Comment: tubal and ablation  Other Topics Concern   Not on file  Social History Narrative   Not on file   Social Determinants of  Health   Financial Resource Strain: Not on file  Food Insecurity: Not on file  Transportation Needs: Not on file  Physical Activity: Not on file  Stress: Not on file  Social Connections: Not on file     Review of Systems   Gen: Denies fever, chills, anorexia. Denies fatigue, weakness, weight loss.  CV: Denies chest pain, palpitations, syncope, peripheral edema, and claudication. Resp: Denies dyspnea at rest, cough, wheezing, coughing up blood, and pleurisy. GI: see HPI Derm: Denies rash, itching, dry skin Psych: Denies depression, anxiety, memory loss, confusion. No homicidal or suicidal ideation.  Heme: Denies bruising, bleeding, and enlarged lymph nodes.   Physical Exam   BP 114/72   Pulse 98   Temp (!) 97.3 F (36.3 C)   Ht '5\' 2"'$  (1.575 m)   Wt 178 lb 9.6 oz (81 kg)   BMI 32.67 kg/m   General:   Alert and oriented. No distress noted. Pleasant and cooperative.  Head:  Normocephalic and atraumatic. Eyes:  Conjuctiva clear without scleral icterus. Mouth:  Oral mucosa pink and moist. Good dentition. No lesions. Lungs:  Clear to auscultation bilaterally. No wheezes, rales, or rhonchi. No distress.  Heart:  S1, S2 present without murmurs appreciated.  Abdomen:  +BS, soft, non-distended.  Epigastrium slightly tender.  No rebound or guarding. No HSM or masses noted. Rectal: deferred Msk:  Symmetrical without gross deformities. Normal posture. Extremities:  Without edema. Neurologic:  Alert and  oriented x4 Psych:  Alert and cooperative. Normal mood and affect.   Assessment  Sara Barnett is a 56 y.o. female with a history of anxiety, depression, HTN, GERD, ADHD, arthritis presenting today with chronic constipation and GERD.  Chronic constipation: Patient has failed Linzess, Amitiza, Trulance in the past with it not being effective or causing diarrhea.  She also states that cost has been an issue as well.  Has tried suppositories and fiber as well.  Only having about 2  bowel movements a week with associated bloating.  Patient reports not wanting to take chemical medications.  Began taking magnesium citrate about 3 days ago, reports she may have had a little bit of improvement since.  Reports she has not been great about  her water intake.  Advised to continue taking the magnesium citrate capsules as directed on the packaging, making sure that she has adequate water intake.  Discussed side effects of increased magnesium and when to stop.  Advised to begin taking Benefiber Tradjenta daily for 2 weeks, then increase to twice daily.  Increase water and exercise as much as tolerated.  GERD: Reports that she just recently was advised that long-term PPI use has side effects including osteoporosis.  She reports she would like to come off chemical medications if possible.  Stated she recently refilled her lansoprazole 30 mg with a 90-day supply, discussed every other day dosing.  Patient stated she wanted to try taking lansoprazole 30 mg every other day, alternating with 15 mg every other day.  Discussed that we could try this over the next 4 to 6 weeks, then decrease to lansoprazole 15 mg every day with goal to wean off as tolerated.  Advised that she may take famotidine 10 mg once daily as needed for breakthrough.  Advised to follow GERD diet.   PLAN    Lansoprazole 15 mg every other day alternating with 30 mg every other day for 4 to 6 weeks then reduce to 15 mg daily. Benefiber 2 teaspoons daily for 2 weeks, then increase to twice daily Magnesium citrate capsule daily, follow instructions on the bottle.  Discussed that occasional use of liquid is okay.  Powdered form of magnesium would likely work better, provided examples.  Discussed effects of too much magnesium, information provided on AVS. Advised 8 to 16 ounces of water when taking magnesium capsules. Increase water and exercise F/u in 6 months   Venetia Night, MSN, FNP-BC, AGACNP-BC [facility]

## 2022-04-11 DIAGNOSIS — Z6831 Body mass index (BMI) 31.0-31.9, adult: Secondary | ICD-10-CM | POA: Diagnosis not present

## 2022-04-11 DIAGNOSIS — F411 Generalized anxiety disorder: Secondary | ICD-10-CM | POA: Diagnosis not present

## 2022-04-11 DIAGNOSIS — E6609 Other obesity due to excess calories: Secondary | ICD-10-CM | POA: Diagnosis not present

## 2022-04-11 DIAGNOSIS — I8391 Asymptomatic varicose veins of right lower extremity: Secondary | ICD-10-CM | POA: Diagnosis not present

## 2022-04-18 DIAGNOSIS — Z8669 Personal history of other diseases of the nervous system and sense organs: Secondary | ICD-10-CM | POA: Diagnosis not present

## 2022-04-18 DIAGNOSIS — H3521 Other non-diabetic proliferative retinopathy, right eye: Secondary | ICD-10-CM | POA: Diagnosis not present

## 2022-04-18 DIAGNOSIS — Z9889 Other specified postprocedural states: Secondary | ICD-10-CM | POA: Diagnosis not present

## 2022-05-08 DIAGNOSIS — M2031 Hallux varus (acquired), right foot: Secondary | ICD-10-CM | POA: Diagnosis not present

## 2022-05-08 DIAGNOSIS — F329 Major depressive disorder, single episode, unspecified: Secondary | ICD-10-CM | POA: Diagnosis not present

## 2022-05-08 DIAGNOSIS — G2581 Restless legs syndrome: Secondary | ICD-10-CM | POA: Diagnosis not present

## 2022-05-08 DIAGNOSIS — Z01818 Encounter for other preprocedural examination: Secondary | ICD-10-CM | POA: Diagnosis not present

## 2022-05-08 DIAGNOSIS — Z8719 Personal history of other diseases of the digestive system: Secondary | ICD-10-CM | POA: Diagnosis not present

## 2022-05-08 DIAGNOSIS — H31409 Unspecified choroidal detachment, unspecified eye: Secondary | ICD-10-CM | POA: Diagnosis not present

## 2022-05-08 DIAGNOSIS — I10 Essential (primary) hypertension: Secondary | ICD-10-CM | POA: Diagnosis not present

## 2022-05-13 DIAGNOSIS — M2031 Hallux varus (acquired), right foot: Secondary | ICD-10-CM | POA: Diagnosis not present

## 2022-05-13 DIAGNOSIS — T8484XA Pain due to internal orthopedic prosthetic devices, implants and grafts, initial encounter: Secondary | ICD-10-CM | POA: Diagnosis not present

## 2022-05-15 DIAGNOSIS — M2011 Hallux valgus (acquired), right foot: Secondary | ICD-10-CM | POA: Diagnosis not present

## 2022-05-15 DIAGNOSIS — H3320 Serous retinal detachment, unspecified eye: Secondary | ICD-10-CM | POA: Diagnosis not present

## 2022-05-15 DIAGNOSIS — G2581 Restless legs syndrome: Secondary | ICD-10-CM | POA: Diagnosis not present

## 2022-05-15 DIAGNOSIS — Z9889 Other specified postprocedural states: Secondary | ICD-10-CM | POA: Diagnosis not present

## 2022-05-15 DIAGNOSIS — H15012 Anterior scleritis, left eye: Secondary | ICD-10-CM | POA: Diagnosis not present

## 2022-05-15 DIAGNOSIS — M5117 Intervertebral disc disorders with radiculopathy, lumbosacral region: Secondary | ICD-10-CM | POA: Diagnosis not present

## 2022-05-15 DIAGNOSIS — M160 Bilateral primary osteoarthritis of hip: Secondary | ICD-10-CM | POA: Diagnosis not present

## 2022-05-15 DIAGNOSIS — T8484XA Pain due to internal orthopedic prosthetic devices, implants and grafts, initial encounter: Secondary | ICD-10-CM | POA: Diagnosis not present

## 2022-05-15 DIAGNOSIS — E668 Other obesity: Secondary | ICD-10-CM | POA: Diagnosis not present

## 2022-05-15 DIAGNOSIS — H409 Unspecified glaucoma: Secondary | ICD-10-CM | POA: Diagnosis not present

## 2022-05-15 DIAGNOSIS — Z87891 Personal history of nicotine dependence: Secondary | ICD-10-CM | POA: Diagnosis not present

## 2022-05-15 DIAGNOSIS — E669 Obesity, unspecified: Secondary | ICD-10-CM | POA: Diagnosis not present

## 2022-05-15 DIAGNOSIS — Z79899 Other long term (current) drug therapy: Secondary | ICD-10-CM | POA: Diagnosis not present

## 2022-05-15 DIAGNOSIS — I10 Essential (primary) hypertension: Secondary | ICD-10-CM | POA: Diagnosis not present

## 2022-05-15 DIAGNOSIS — K219 Gastro-esophageal reflux disease without esophagitis: Secondary | ICD-10-CM | POA: Diagnosis not present

## 2022-05-15 DIAGNOSIS — Z981 Arthrodesis status: Secondary | ICD-10-CM | POA: Diagnosis not present

## 2022-05-15 DIAGNOSIS — M419 Scoliosis, unspecified: Secondary | ICD-10-CM | POA: Diagnosis not present

## 2022-05-15 DIAGNOSIS — M21171 Varus deformity, not elsewhere classified, right ankle: Secondary | ICD-10-CM | POA: Diagnosis not present

## 2022-05-15 DIAGNOSIS — Z809 Family history of malignant neoplasm, unspecified: Secondary | ICD-10-CM | POA: Diagnosis not present

## 2022-05-15 DIAGNOSIS — Z6832 Body mass index (BMI) 32.0-32.9, adult: Secondary | ICD-10-CM | POA: Diagnosis not present

## 2022-05-15 DIAGNOSIS — M2031 Hallux varus (acquired), right foot: Secondary | ICD-10-CM | POA: Diagnosis not present

## 2022-05-24 ENCOUNTER — Ambulatory Visit
Admission: EM | Admit: 2022-05-24 | Discharge: 2022-05-24 | Disposition: A | Discharge status: Home / Self Care | Payer: PPO

## 2022-05-24 DIAGNOSIS — R519 Headache, unspecified: Secondary | ICD-10-CM | POA: Diagnosis not present

## 2022-05-24 DIAGNOSIS — W19XXXA Unspecified fall, initial encounter: Secondary | ICD-10-CM | POA: Diagnosis not present

## 2022-05-24 DIAGNOSIS — S0922XA Traumatic rupture of left ear drum, initial encounter: Secondary | ICD-10-CM | POA: Diagnosis not present

## 2022-05-24 DIAGNOSIS — J3489 Other specified disorders of nose and nasal sinuses: Secondary | ICD-10-CM | POA: Diagnosis not present

## 2022-05-24 MED ORDER — OFLOXACIN 0.3 % OT SOLN: 10.0000 [drp] | Freq: Every day | OTIC | 0 refills | Status: AC

## 2022-05-24 NOTE — ED Triage Notes (Signed)
Pt reports she fall when using a walker and hit his face with the kitchen floor 5 days ago. Pt think her nose is broken as she feels a bone moving when she touch the nose. Pt reports fluids in left ear; numbness in front teeth and low back pain. Marland Kitchen

## 2022-05-24 NOTE — Discharge Instructions (Addendum)
-   Please start the ear drops to the left ear for the ruptured ear drum.   - You can use ice on your nasal bridge to help with swelling and pain - If headache worsens, you start having new vision changes or changes in mental status, go to the Emergency Room

## 2022-05-24 NOTE — ED Provider Notes (Signed)
[facility]    CSN: [id number] Arrival date & time: 05/24/22  1056      History   Chief Complaint Chief Complaint  Patient presents with   Fall        Facial Injury    HPI Sara Barnett is a 56 y.o. female.   Patient presents with facial pain after a fall a few days ago.  Reports she was sitting on her walker and playing tug of war with her dog.  She was letting the dog pull her around the house and the dog pulled her too hard and she fell face-first into the floor.  Reports she had a bloody nose and felt like there was water rushing through her left ear immediately after.  She denies loss of consciousness, headache, vision changes, lightheadedness, or increased fatigue.  She is concerned her nose is broken.  Denies runny or stuff nose, nasal congestion.  She is able to breathe out of her nose.  Reports there is a painful area on the side of her nose and it is bruised.  Denies ear drainage or muffled hearing since the incident.    She recently had foot surgery and has been taking pain medication and aspirin to prevent blood clots.      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   Depression    Deviated nasal septum 07/2017   Essential hypertension    GERD (gastroesophageal reflux disease)    History of cardiac murmur    Legally blind    Restless leg     Patient Active Problem List   Diagnosis Date Noted   Acquired hallux varus of right foot 10/18/2020   Closed fracture of metatarsal bone 60/63/0160   Complication associated with orthopedic device ([place]) 06/26/2020   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   Major depressive disorder, severe ([place]) 05/01/2019   Suicide attempt ([place])    Drug  overdose, intentional self-harm, initial encounter ([place]) 04/25/2019   Shock ([place]) 04/25/2019   Therapeutic drug monitoring 10/12/2018   Encounter for screening colonoscopy 07/09/2018   Osteoarthritis of knee 04/17/2018   RLS (restless legs syndrome) 03/16/2018   Constipation 01/29/2017   Breast tenderness 12/13/2015   Fatigue 12/13/2015   Hot flashes 12/13/2015   Anterior scleritis of left eye 06/22/2015   Major depression 07/20/2014   Allergy status to unspecified drugs, medicaments and biological substances 04/21/2013   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   Cystoid macular edema 09/30/2011   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   Benign essential hypertension 08/13/2011   History of congenital cataract 08/13/2011   History of gastroesophageal reflux (GERD) 08/13/2011   Personal history of other diseases of the digestive system 08/13/2011    Past Surgical History:  Procedure Laterality Date   ABLATION ON ENDOMETRIOSIS     BUNIONECTOMY Right    BUNIONECTOMY Right 02/09/2020   Procedure: BUNIONECTOMY RIGHT FOOT WITH SESAMOIDECTOMY, LENGTHENING OF RIGHT EXTENSOR HALLUCIS LONGUS TENDON, PLANTAR FASCIA RELEASE OF RIGHT FOOT;  Surgeon: Caprice Beaver, DPM;  Location: [facility];  Service: Podiatry;  Laterality: Right;   CATARACT EXTRACTION, BILATERAL     age 89 year   CESAREAN SECTION  x 2   COLONOSCOPY WITH PROPOFOL N/A 08/25/2018   moderate pancolonic diverticulosis, external and internal hemorrhoids, redundant left colon.    ENDOSCOPIC CONCHA BULLOSA RESECTION Bilateral 08/25/2017   Procedure: BILATERAL ENDOSCOPIC CONCHA BULLOSA RESECTION;  Surgeon: Leta Baptist, MD;  Location: [place];  Service: ENT;  Laterality: Bilateral;   ETHMOIDECTOMY Bilateral 08/25/2017   Procedure: BILATERAL TOTAL ETHMOIDECTOMY;  Surgeon: Leta Baptist, MD;  Location: [place];  Service: ENT;  Laterality:  Bilateral;   EYE SURGERY     HARDWARE REMOVAL Right 07/06/2020   Procedure: Removal of deep implants right 1st metatarsal;  Surgeon: Wylene Simmer, MD;  Location: [place];  Service: Orthopedics;  Laterality: Right;  12mn   INTRAOCULAR LENS INSERTION Bilateral    age [age]s  LUMBAR FUSION  07/05/2004   LUMBAR LAMINECTOMY  07/05/2004   L5-S1   MAXILLARY ANTROSTOMY Bilateral 08/25/2017   Procedure: BILATERAL MAXILLARY ANTROSTOMY WITH TISSUE REMOVAL;  Surgeon: TLeta Baptist MD;  [facility]  Service: ENT;  Laterality: Bilateral;   MEMBRANE PEEL Right 11/06/2011; 08/06/2013   NASAL SEPTOPLASTY W/ TURBINOPLASTY Bilateral 08/25/2017   Procedure: NASAL SEPTOPLASTY WITH BILATERAL TURBINATE REDUCTION;  Surgeon: TLeta Baptist MD;  [facility]  Service: ENT;  Laterality: Bilateral;   ORIF TOE FRACTURE Right 07/06/2020   Procedure: 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;  Surgeon: HWylene Simmer MD;  Location: [facility]  Service: Orthopedics;  Laterality: Right;   PARS PLANA VITRECTOMY Right 03/22/2011; 08/21/2011; 12/20/2011; 05/13/2012; 11/20/2012; 05/12/2013; 08/06/2013; 05/17/2016   PARS PLANA VITRECTOMY W/ SCLERAL BUCKLE Left 04/21/2015   PLANTAR FASCIA RELEASE Right    SINUS ENDO W/FUSION Bilateral 08/25/2017   Procedure: ENDOSCOPIC SINUS SURGERY WITH FUSION NAVIGATION;  Surgeon: TLeta Baptist MD;  [facility]  Service: ENT;  Laterality: Bilateral;   TUBAL LIGATION      OB History     Gravida  2   Para  2   Term      Preterm      AB      Living  2      SAB      IAB      Ectopic      Multiple      Live Births               Home Medications    Prior to Admission medications   Medication Sig Start Date End Date Taking? Authorizing Provider  aspirin EC 325 MG tablet Take by mouth. 05/15/22 05/15/23 Yes [provider]  ofloxacin (FLOXIN) 0.3 % OTIC  solution Place 10 drops into the left ear daily for 7 days. 05/24/22 05/31/22 Yes MEulogio Bear NP  amLODipine-benazepril (LOTREL) 10-40 MG capsule     [provider]  amoxicillin (AMOXIL) 875 MG tablet Take 1 tablet by mouth every 12 (twelve) hours.    [provider]  benazepril (LOTENSIN) 40 MG tablet Take 40 mg by mouth daily. 12/12/19   [provider]  benazepril (LOTENSIN) 5 MG tablet     [provider]  buPROPion (WELLBUTRIN XL) 300 MG 24 hr tablet TAKE 1 TABLET BY MOUTH ONCE DAILY - STOP TAKING 150 MG    [provider]  Chlorphen-PE-Acetaminophen 4-10-325 MG TABS TAKE 1 TABLET BY MOUTH EVERY 4 HOURS AS NEEDED FOR COLD SYMPTOMS    [provider]  chlorthalidone (HYGROTON) 25 MG tablet Take 25  mg by mouth daily. 02/20/21   [provider]  clonazePAM (KLONOPIN) 0.5 MG tablet Take 0.5 mg by mouth at bedtime as needed (To help with restless mind).  01/14/20   [provider]  docusate sodium (COLACE) 100 MG capsule Take 1 capsule (100 mg total) by mouth 2 (two) times daily. While taking narcotic pain medicine. Patient not taking: Reported on 03/26/2022 07/06/20   Corky Sing, PA-C  lansoprazole (PREVACID) 15 MG capsule Take 1 capsule (15 mg total) by mouth every other day. 03/26/22   Sherron Monday, NP  lansoprazole (PREVACID) 30 MG capsule Take 30 mg by mouth every other day.  05/28/22  [provider]  montelukast (SINGULAIR) 10 MG tablet Take 1 tablet (10 mg total) by mouth at bedtime. 10/30/21   Jaynee Eagles, PA-C  phentermine 15 MG capsule Take 30 mg by mouth daily. 02/05/21   [provider]  pramipexole (MIRAPEX) 0.125 MG tablet TAKE 3 TABLETS BY MOUTH ONCE DAILY IN THE EVENING 90 TO 120 MINUTES BEFORE BEDTIME 10/16/21   Ward Givens, NP  prednisoLONE acetate (PRED FORTE) 1 % ophthalmic suspension Place 1 drop into the right eye in the morning and at bedtime.     [provider]     Family History Family History  Problem Relation Age of Onset   Depression Mother    Hypertension Father    Prostate cancer Father    Kidney failure Father    Colon cancer Neg Hx    Colon polyps Neg Hx     Social History Social History   Tobacco Use   Smoking status: Former    Packs/day: 3.00    Years: 15.00    Total pack years: 45.00    Types: Cigarettes    Quit date: 05/24/2009    Years since quitting: 13.0   Smokeless tobacco: Never  Vaping Use   Vaping Use: Never used  Substance Use Topics   Alcohol use: Yes   Drug use: No     Allergies   Doxycycline, Hydrocodone-acetaminophen, Methylprednisolone, Other, Augmentin [amoxicillin-pot clavulanate], and Statins   Review of Systems Review of Systems Per HPI  Physical Exam Triage Vital Signs ED Triage Vitals  Enc Vitals Group     BP 05/24/22 1120 (!) 159/116     Pulse Rate 05/24/22 1120 79     Resp 05/24/22 1120 18     Temp 05/24/22 1120 98.1 F (36.7 C)     Temp Source 05/24/22 1120 Oral     SpO2 05/24/22 1120 95 %     Weight --      Height --      Head Circumference --      Peak Flow --      Pain Score 05/24/22 1117 8     Pain Loc --      Pain Edu? --      Excl. in [place]? --    No data found.  Updated Vital Signs BP (!) 159/116 (BP Location: Right Arm)   Pulse 79   Temp 98.1 F (36.7 C) (Oral)   Resp 18   SpO2 95%   Visual Acuity Right Eye Distance:   Left Eye Distance:   Bilateral Distance:    Right Eye Near:   Left Eye Near:    Bilateral Near:     Physical Exam Vitals and nursing note reviewed.  Constitutional:      General: She is not in acute distress.    Appearance: Normal appearance.  She is not ill-appearing or toxic-appearing.  HENT:     Head: Normocephalic and atraumatic.     Right Ear: Tympanic membrane, ear canal and external ear normal.     Left Ear: No drainage, swelling or tenderness. Tympanic membrane is perforated.     Nose: Nasal tenderness present. No nasal  deformity or septal deviation.     Right Turbinates: Not enlarged.     Left Turbinates: Not enlarged.     Right Sinus: No maxillary sinus tenderness or frontal sinus tenderness.     Left Sinus: No frontal sinus tenderness.      Comments: No symptoms to palpation the area marked.  There is bruising to the bridge of the nose and under the eyes bilaterally.  No obvious deformity noted.  Nares patent bilaterally.    Mouth/Throat:     Mouth: Mucous membranes are moist.     Pharynx: Oropharynx is clear.  Eyes:     General: No scleral icterus.       Right eye: No discharge.        Left eye: No discharge.     Extraocular Movements: Extraocular movements intact.     Right eye: Normal extraocular motion.     Left eye: Normal extraocular motion.  Cardiovascular:     Rate and Rhythm: Normal rate and regular rhythm.  Pulmonary:     Effort: Pulmonary effort is normal. No respiratory distress.     Breath sounds: Normal breath sounds. No wheezing, rhonchi or rales.  Skin:    General: Skin is warm and dry.     Coloration: Skin is not jaundiced or pale.     Findings: No erythema.  Neurological:     Mental Status: She is alert and oriented to person, place, and time.     Gait: Gait abnormal (sitting in wheelchair; right foot lower extremity in short leg cast).  Psychiatric:        Behavior: Behavior is cooperative.      UC Treatments / Results  Labs (all labs ordered are listed, but only abnormal results are displayed) Labs Reviewed - No data to display  EKG   Radiology No results found.  Procedures Procedures (including critical care time)  Medications Ordered in UC Medications - No data to display  Initial Impression / Assessment and Plan / UC Course  I have reviewed the triage vital signs and the nursing notes.  Pertinent labs & imaging results that were available during my care of the patient were reviewed by me and considered in my medical decision making (see chart for  details).    Patient is a pleasant, ill-appearing 56 year old female presenting for face and nose pain after a fall earlier this week.  On examination, left tympanic membrane is ruptured, I suspect from the fall.  Start ofloxacin eardrops 10 drops to the left ear daily for 7 days.  Discussed that x-ray imaging of nasal bones would be of low yield today given length of time from injury, no obvious deformity, and nares are patent.  Discussed option of pursuing further work-up in the emergency room if patient desires.  Also discussed supportive care to help with pain/swelling in the bridge of the nose.  Encouraged increase safety measures at home to prevent future falls.  Discussed that if symptoms worsen, she should go to the emergency room for further evaluation.  The patient was given the opportunity to ask questions.  All questions answered to their satisfaction.  The patient is in agreement to  this plan.   Final Clinical Impressions(s) / UC Diagnoses   Final diagnoses:  Face pain  Fall, initial encounter  Nose pain  Traumatic rupture of left ear drum, initial encounter     Discharge Instructions      - Please start the ear drops to the left ear for the ruptured ear drum.   - You can use ice on your nasal bridge to help with swelling and pain - If headache worsens, you start having new vision changes or changes in mental status, go to the Emergency Room     ED Prescriptions     Medication Sig Dispense Auth. Provider   ofloxacin (FLOXIN) 0.3 % OTIC solution Place 10 drops into the left ear daily for 7 days. 5 mL Eulogio Bear, NP      PDMP not reviewed this encounter.   Eulogio Bear, NP 05/24/22 [phone]

## 2022-05-29 DIAGNOSIS — Z4789 Encounter for other orthopedic aftercare: Secondary | ICD-10-CM | POA: Diagnosis not present

## 2022-05-29 DIAGNOSIS — M2031 Hallux varus (acquired), right foot: Secondary | ICD-10-CM | POA: Diagnosis not present

## 2022-05-29 DIAGNOSIS — T8484XA Pain due to internal orthopedic prosthetic devices, implants and grafts, initial encounter: Secondary | ICD-10-CM | POA: Diagnosis not present

## 2022-06-13 DIAGNOSIS — Z9889 Other specified postprocedural states: Secondary | ICD-10-CM | POA: Diagnosis not present

## 2022-06-13 DIAGNOSIS — Z8669 Personal history of other diseases of the nervous system and sense organs: Secondary | ICD-10-CM | POA: Diagnosis not present

## 2022-06-13 DIAGNOSIS — H3521 Other non-diabetic proliferative retinopathy, right eye: Secondary | ICD-10-CM | POA: Diagnosis not present

## 2022-06-27 DIAGNOSIS — M2011 Hallux valgus (acquired), right foot: Secondary | ICD-10-CM | POA: Diagnosis not present

## 2022-06-27 DIAGNOSIS — Z4789 Encounter for other orthopedic aftercare: Secondary | ICD-10-CM | POA: Diagnosis not present

## 2022-06-27 DIAGNOSIS — T8484XA Pain due to internal orthopedic prosthetic devices, implants and grafts, initial encounter: Secondary | ICD-10-CM | POA: Diagnosis not present

## 2022-06-27 DIAGNOSIS — M7989 Other specified soft tissue disorders: Secondary | ICD-10-CM | POA: Diagnosis not present

## 2022-07-04 DIAGNOSIS — R7301 Impaired fasting glucose: Secondary | ICD-10-CM | POA: Diagnosis not present

## 2022-07-04 DIAGNOSIS — E785 Hyperlipidemia, unspecified: Secondary | ICD-10-CM | POA: Diagnosis not present

## 2022-07-12 DIAGNOSIS — E669 Obesity, unspecified: Secondary | ICD-10-CM | POA: Diagnosis not present

## 2022-07-12 DIAGNOSIS — J3489 Other specified disorders of nose and nasal sinuses: Secondary | ICD-10-CM | POA: Diagnosis not present

## 2022-07-12 DIAGNOSIS — M79673 Pain in unspecified foot: Secondary | ICD-10-CM | POA: Diagnosis not present

## 2022-07-12 DIAGNOSIS — I8391 Asymptomatic varicose veins of right lower extremity: Secondary | ICD-10-CM | POA: Diagnosis not present

## 2022-07-12 DIAGNOSIS — Z79890 Hormone replacement therapy (postmenopausal): Secondary | ICD-10-CM | POA: Diagnosis not present

## 2022-07-12 DIAGNOSIS — M25551 Pain in right hip: Secondary | ICD-10-CM | POA: Diagnosis not present

## 2022-07-12 DIAGNOSIS — M545 Low back pain, unspecified: Secondary | ICD-10-CM | POA: Diagnosis not present

## 2022-07-12 DIAGNOSIS — R7301 Impaired fasting glucose: Secondary | ICD-10-CM | POA: Diagnosis not present

## 2022-07-12 DIAGNOSIS — K219 Gastro-esophageal reflux disease without esophagitis: Secondary | ICD-10-CM | POA: Diagnosis not present

## 2022-07-12 DIAGNOSIS — E785 Hyperlipidemia, unspecified: Secondary | ICD-10-CM | POA: Diagnosis not present

## 2022-07-12 DIAGNOSIS — G4700 Insomnia, unspecified: Secondary | ICD-10-CM | POA: Diagnosis not present

## 2022-07-12 DIAGNOSIS — I10 Essential (primary) hypertension: Secondary | ICD-10-CM | POA: Diagnosis not present

## 2022-07-25 DIAGNOSIS — M2030 Hallux varus (acquired), unspecified foot: Secondary | ICD-10-CM | POA: Diagnosis not present

## 2022-08-06 ENCOUNTER — Other Ambulatory Visit: Payer: Self-pay | Admitting: Gastroenterology

## 2022-08-06 DIAGNOSIS — K219 Gastro-esophageal reflux disease without esophagitis: Secondary | ICD-10-CM

## 2022-08-12 DIAGNOSIS — J3489 Other specified disorders of nose and nasal sinuses: Secondary | ICD-10-CM | POA: Diagnosis not present

## 2022-08-12 DIAGNOSIS — J302 Other seasonal allergic rhinitis: Secondary | ICD-10-CM | POA: Diagnosis not present

## 2022-08-26 DIAGNOSIS — Z87891 Personal history of nicotine dependence: Secondary | ICD-10-CM | POA: Diagnosis not present

## 2022-08-26 DIAGNOSIS — Z9889 Other specified postprocedural states: Secondary | ICD-10-CM | POA: Diagnosis not present

## 2022-08-26 DIAGNOSIS — M2031 Hallux varus (acquired), right foot: Secondary | ICD-10-CM | POA: Diagnosis not present

## 2022-08-26 DIAGNOSIS — T8484XA Pain due to internal orthopedic prosthetic devices, implants and grafts, initial encounter: Secondary | ICD-10-CM | POA: Diagnosis not present

## 2022-08-26 DIAGNOSIS — M7731 Calcaneal spur, right foot: Secondary | ICD-10-CM | POA: Diagnosis not present

## 2022-08-26 DIAGNOSIS — M79671 Pain in right foot: Secondary | ICD-10-CM | POA: Diagnosis not present

## 2022-08-26 DIAGNOSIS — G8929 Other chronic pain: Secondary | ICD-10-CM | POA: Diagnosis not present

## 2022-08-26 DIAGNOSIS — Z981 Arthrodesis status: Secondary | ICD-10-CM | POA: Diagnosis not present

## 2022-08-26 DIAGNOSIS — M960 Pseudarthrosis after fusion or arthrodesis: Secondary | ICD-10-CM | POA: Diagnosis not present

## 2022-08-26 DIAGNOSIS — M2141 Flat foot [pes planus] (acquired), right foot: Secondary | ICD-10-CM | POA: Diagnosis not present

## 2022-09-16 DIAGNOSIS — M2031 Hallux varus (acquired), right foot: Secondary | ICD-10-CM | POA: Diagnosis not present

## 2022-09-16 DIAGNOSIS — M7989 Other specified soft tissue disorders: Secondary | ICD-10-CM | POA: Diagnosis not present

## 2022-09-16 DIAGNOSIS — M2011 Hallux valgus (acquired), right foot: Secondary | ICD-10-CM | POA: Diagnosis not present

## 2022-09-16 DIAGNOSIS — M7731 Calcaneal spur, right foot: Secondary | ICD-10-CM | POA: Diagnosis not present

## 2022-09-23 ENCOUNTER — Other Ambulatory Visit: Payer: Self-pay | Admitting: Gastroenterology

## 2022-10-02 ENCOUNTER — Ambulatory Visit: Payer: PPO | Admitting: Gastroenterology

## 2022-10-04 ENCOUNTER — Other Ambulatory Visit: Payer: Self-pay | Admitting: Gastroenterology

## 2022-10-04 DIAGNOSIS — K5900 Constipation, unspecified: Secondary | ICD-10-CM

## 2022-10-04 MED ORDER — LINACLOTIDE 290 MCG PO CAPS: 290.0000 ug | ORAL_CAPSULE | Freq: Every day | ORAL | 2 refills | Status: AC

## 2022-10-11 DIAGNOSIS — Z0000 Encounter for general adult medical examination without abnormal findings: Secondary | ICD-10-CM | POA: Diagnosis not present

## 2022-10-17 DIAGNOSIS — M7731 Calcaneal spur, right foot: Secondary | ICD-10-CM | POA: Diagnosis not present

## 2022-10-17 DIAGNOSIS — T8484XA Pain due to internal orthopedic prosthetic devices, implants and grafts, initial encounter: Secondary | ICD-10-CM | POA: Diagnosis not present

## 2022-10-17 DIAGNOSIS — M2011 Hallux valgus (acquired), right foot: Secondary | ICD-10-CM | POA: Diagnosis not present

## 2022-10-17 DIAGNOSIS — M2031 Hallux varus (acquired), right foot: Secondary | ICD-10-CM | POA: Diagnosis not present

## 2022-12-10 DIAGNOSIS — Z01818 Encounter for other preprocedural examination: Secondary | ICD-10-CM | POA: Diagnosis not present

## 2022-12-10 DIAGNOSIS — H4050X0 Glaucoma secondary to other eye disorders, unspecified eye, stage unspecified: Secondary | ICD-10-CM | POA: Diagnosis not present

## 2022-12-10 DIAGNOSIS — M2031 Hallux varus (acquired), right foot: Secondary | ICD-10-CM | POA: Diagnosis not present

## 2022-12-10 DIAGNOSIS — G2581 Restless legs syndrome: Secondary | ICD-10-CM | POA: Diagnosis not present

## 2022-12-10 DIAGNOSIS — R7303 Prediabetes: Secondary | ICD-10-CM | POA: Diagnosis not present

## 2022-12-10 DIAGNOSIS — M48061 Spinal stenosis, lumbar region without neurogenic claudication: Secondary | ICD-10-CM | POA: Diagnosis not present

## 2022-12-10 DIAGNOSIS — I10 Essential (primary) hypertension: Secondary | ICD-10-CM | POA: Diagnosis not present

## 2022-12-10 DIAGNOSIS — Z8719 Personal history of other diseases of the digestive system: Secondary | ICD-10-CM | POA: Diagnosis not present

## 2022-12-10 DIAGNOSIS — F329 Major depressive disorder, single episode, unspecified: Secondary | ICD-10-CM | POA: Diagnosis not present

## 2022-12-10 DIAGNOSIS — H3521 Other non-diabetic proliferative retinopathy, right eye: Secondary | ICD-10-CM | POA: Diagnosis not present

## 2022-12-12 DIAGNOSIS — I10 Essential (primary) hypertension: Secondary | ICD-10-CM | POA: Diagnosis not present

## 2022-12-12 DIAGNOSIS — M2031 Hallux varus (acquired), right foot: Secondary | ICD-10-CM | POA: Diagnosis not present

## 2022-12-12 DIAGNOSIS — T8484XA Pain due to internal orthopedic prosthetic devices, implants and grafts, initial encounter: Secondary | ICD-10-CM | POA: Diagnosis not present

## 2022-12-12 DIAGNOSIS — R7303 Prediabetes: Secondary | ICD-10-CM | POA: Diagnosis not present

## 2022-12-12 DIAGNOSIS — Z01818 Encounter for other preprocedural examination: Secondary | ICD-10-CM | POA: Diagnosis not present

## 2023-01-03 DIAGNOSIS — R5383 Other fatigue: Secondary | ICD-10-CM | POA: Diagnosis not present

## 2023-01-03 DIAGNOSIS — R7303 Prediabetes: Secondary | ICD-10-CM | POA: Diagnosis not present

## 2023-01-03 DIAGNOSIS — M79673 Pain in unspecified foot: Secondary | ICD-10-CM | POA: Diagnosis not present

## 2023-01-06 DIAGNOSIS — R5383 Other fatigue: Secondary | ICD-10-CM | POA: Diagnosis not present

## 2023-01-06 DIAGNOSIS — R7303 Prediabetes: Secondary | ICD-10-CM | POA: Diagnosis not present

## 2023-01-07 DIAGNOSIS — E669 Obesity, unspecified: Secondary | ICD-10-CM | POA: Diagnosis not present

## 2023-01-07 DIAGNOSIS — Z9889 Other specified postprocedural states: Secondary | ICD-10-CM | POA: Diagnosis not present

## 2023-01-07 DIAGNOSIS — H3521 Other non-diabetic proliferative retinopathy, right eye: Secondary | ICD-10-CM | POA: Diagnosis not present

## 2023-01-07 DIAGNOSIS — Z79899 Other long term (current) drug therapy: Secondary | ICD-10-CM | POA: Diagnosis not present

## 2023-01-07 DIAGNOSIS — T84293A Other mechanical complication of internal fixation device of bones of foot and toes, initial encounter: Secondary | ICD-10-CM | POA: Diagnosis not present

## 2023-01-07 DIAGNOSIS — K219 Gastro-esophageal reflux disease without esophagitis: Secondary | ICD-10-CM | POA: Diagnosis not present

## 2023-01-07 DIAGNOSIS — Z87891 Personal history of nicotine dependence: Secondary | ICD-10-CM | POA: Diagnosis not present

## 2023-01-07 DIAGNOSIS — M2031 Hallux varus (acquired), right foot: Secondary | ICD-10-CM | POA: Diagnosis not present

## 2023-01-07 DIAGNOSIS — H4089 Other specified glaucoma: Secondary | ICD-10-CM | POA: Diagnosis not present

## 2023-01-07 DIAGNOSIS — Z7982 Long term (current) use of aspirin: Secondary | ICD-10-CM | POA: Diagnosis not present

## 2023-01-07 DIAGNOSIS — Z6834 Body mass index (BMI) 34.0-34.9, adult: Secondary | ICD-10-CM | POA: Diagnosis not present

## 2023-01-07 DIAGNOSIS — I10 Essential (primary) hypertension: Secondary | ICD-10-CM | POA: Diagnosis not present

## 2023-01-07 DIAGNOSIS — F329 Major depressive disorder, single episode, unspecified: Secondary | ICD-10-CM | POA: Diagnosis not present

## 2023-01-07 DIAGNOSIS — G2581 Restless legs syndrome: Secondary | ICD-10-CM | POA: Diagnosis not present

## 2023-01-07 DIAGNOSIS — F325 Major depressive disorder, single episode, in full remission: Secondary | ICD-10-CM | POA: Diagnosis not present

## 2023-01-07 DIAGNOSIS — G8918 Other acute postprocedural pain: Secondary | ICD-10-CM | POA: Diagnosis not present

## 2023-01-07 DIAGNOSIS — T8484XA Pain due to internal orthopedic prosthetic devices, implants and grafts, initial encounter: Secondary | ICD-10-CM | POA: Diagnosis not present

## 2023-01-07 DIAGNOSIS — M48061 Spinal stenosis, lumbar region without neurogenic claudication: Secondary | ICD-10-CM | POA: Diagnosis not present

## 2023-01-07 DIAGNOSIS — Z981 Arthrodesis status: Secondary | ICD-10-CM | POA: Diagnosis not present

## 2023-01-12 ENCOUNTER — Other Ambulatory Visit: Payer: Self-pay | Admitting: Adult Health

## 2023-01-27 DIAGNOSIS — I10 Essential (primary) hypertension: Secondary | ICD-10-CM | POA: Diagnosis not present

## 2023-01-27 DIAGNOSIS — R5383 Other fatigue: Secondary | ICD-10-CM | POA: Diagnosis not present

## 2023-01-27 DIAGNOSIS — E669 Obesity, unspecified: Secondary | ICD-10-CM | POA: Diagnosis not present

## 2023-01-27 DIAGNOSIS — Z79890 Hormone replacement therapy (postmenopausal): Secondary | ICD-10-CM | POA: Diagnosis not present

## 2023-01-27 DIAGNOSIS — G4700 Insomnia, unspecified: Secondary | ICD-10-CM | POA: Diagnosis not present

## 2023-01-27 DIAGNOSIS — K219 Gastro-esophageal reflux disease without esophagitis: Secondary | ICD-10-CM | POA: Diagnosis not present

## 2023-01-27 DIAGNOSIS — M25551 Pain in right hip: Secondary | ICD-10-CM | POA: Diagnosis not present

## 2023-01-27 DIAGNOSIS — M545 Low back pain, unspecified: Secondary | ICD-10-CM | POA: Diagnosis not present

## 2023-01-27 DIAGNOSIS — R7303 Prediabetes: Secondary | ICD-10-CM | POA: Diagnosis not present

## 2023-01-27 DIAGNOSIS — E7801 Familial hypercholesterolemia: Secondary | ICD-10-CM | POA: Diagnosis not present

## 2023-01-27 DIAGNOSIS — M79673 Pain in unspecified foot: Secondary | ICD-10-CM | POA: Diagnosis not present

## 2023-01-27 DIAGNOSIS — I8391 Asymptomatic varicose veins of right lower extremity: Secondary | ICD-10-CM | POA: Diagnosis not present

## 2023-02-13 DIAGNOSIS — T8484XA Pain due to internal orthopedic prosthetic devices, implants and grafts, initial encounter: Secondary | ICD-10-CM | POA: Diagnosis not present

## 2023-04-08 ENCOUNTER — Ambulatory Visit (INDEPENDENT_AMBULATORY_CARE_PROVIDER_SITE_OTHER): Payer: PPO | Admitting: Podiatry

## 2023-04-08 DIAGNOSIS — J343 Hypertrophy of nasal turbinates: Secondary | ICD-10-CM | POA: Diagnosis not present

## 2023-04-08 DIAGNOSIS — H903 Sensorineural hearing loss, bilateral: Secondary | ICD-10-CM | POA: Diagnosis not present

## 2023-04-08 DIAGNOSIS — M2061 Acquired deformities of toe(s), unspecified, right foot: Secondary | ICD-10-CM | POA: Diagnosis not present

## 2023-04-08 DIAGNOSIS — J324 Chronic pansinusitis: Secondary | ICD-10-CM | POA: Diagnosis not present

## 2023-04-08 DIAGNOSIS — M2062 Acquired deformities of toe(s), unspecified, left foot: Secondary | ICD-10-CM

## 2023-04-08 DIAGNOSIS — L603 Nail dystrophy: Secondary | ICD-10-CM

## 2023-04-08 DIAGNOSIS — H7202 Central perforation of tympanic membrane, left ear: Secondary | ICD-10-CM | POA: Diagnosis not present

## 2023-04-08 NOTE — Patient Instructions (Signed)

## 2023-06-19 DIAGNOSIS — H40052 Ocular hypertension, left eye: Secondary | ICD-10-CM | POA: Diagnosis not present

## 2023-07-23 DIAGNOSIS — R7301 Impaired fasting glucose: Secondary | ICD-10-CM | POA: Diagnosis not present

## 2023-07-23 DIAGNOSIS — E785 Hyperlipidemia, unspecified: Secondary | ICD-10-CM | POA: Diagnosis not present

## 2023-07-24 DIAGNOSIS — H40052 Ocular hypertension, left eye: Secondary | ICD-10-CM | POA: Diagnosis not present

## 2023-07-24 DIAGNOSIS — H01002 Unspecified blepharitis right lower eyelid: Secondary | ICD-10-CM | POA: Diagnosis not present

## 2023-07-24 DIAGNOSIS — H01001 Unspecified blepharitis right upper eyelid: Secondary | ICD-10-CM | POA: Diagnosis not present

## 2023-07-29 ENCOUNTER — Other Ambulatory Visit (HOSPITAL_COMMUNITY): Payer: Self-pay | Admitting: Family Medicine

## 2023-07-29 DIAGNOSIS — I10 Essential (primary) hypertension: Secondary | ICD-10-CM | POA: Diagnosis not present

## 2023-07-29 DIAGNOSIS — M79673 Pain in unspecified foot: Secondary | ICD-10-CM | POA: Diagnosis not present

## 2023-07-29 DIAGNOSIS — M545 Low back pain, unspecified: Secondary | ICD-10-CM | POA: Diagnosis not present

## 2023-07-29 DIAGNOSIS — R5383 Other fatigue: Secondary | ICD-10-CM | POA: Diagnosis not present

## 2023-07-29 DIAGNOSIS — M25551 Pain in right hip: Secondary | ICD-10-CM | POA: Diagnosis not present

## 2023-07-29 DIAGNOSIS — M79641 Pain in right hand: Secondary | ICD-10-CM | POA: Diagnosis not present

## 2023-07-29 DIAGNOSIS — E669 Obesity, unspecified: Secondary | ICD-10-CM | POA: Diagnosis not present

## 2023-07-29 DIAGNOSIS — I8391 Asymptomatic varicose veins of right lower extremity: Secondary | ICD-10-CM | POA: Diagnosis not present

## 2023-07-29 DIAGNOSIS — R7303 Prediabetes: Secondary | ICD-10-CM | POA: Diagnosis not present

## 2023-07-29 DIAGNOSIS — G4700 Insomnia, unspecified: Secondary | ICD-10-CM | POA: Diagnosis not present

## 2023-07-29 DIAGNOSIS — E7801 Familial hypercholesterolemia: Secondary | ICD-10-CM | POA: Diagnosis not present

## 2023-07-29 DIAGNOSIS — K219 Gastro-esophageal reflux disease without esophagitis: Secondary | ICD-10-CM | POA: Diagnosis not present

## 2023-08-07 DIAGNOSIS — H3521 Other non-diabetic proliferative retinopathy, right eye: Secondary | ICD-10-CM | POA: Diagnosis not present

## 2023-08-07 DIAGNOSIS — Z9889 Other specified postprocedural states: Secondary | ICD-10-CM | POA: Diagnosis not present

## 2023-08-07 DIAGNOSIS — Z8669 Personal history of other diseases of the nervous system and sense organs: Secondary | ICD-10-CM | POA: Diagnosis not present

## 2023-08-21 DIAGNOSIS — T8484XA Pain due to internal orthopedic prosthetic devices, implants and grafts, initial encounter: Secondary | ICD-10-CM | POA: Diagnosis not present

## 2023-08-21 DIAGNOSIS — M2142 Flat foot [pes planus] (acquired), left foot: Secondary | ICD-10-CM | POA: Diagnosis not present

## 2023-08-21 DIAGNOSIS — M2141 Flat foot [pes planus] (acquired), right foot: Secondary | ICD-10-CM | POA: Diagnosis not present

## 2023-08-21 DIAGNOSIS — M19071 Primary osteoarthritis, right ankle and foot: Secondary | ICD-10-CM | POA: Diagnosis not present

## 2023-08-21 DIAGNOSIS — M7731 Calcaneal spur, right foot: Secondary | ICD-10-CM | POA: Diagnosis not present

## 2023-09-08 DIAGNOSIS — Z87891 Personal history of nicotine dependence: Secondary | ICD-10-CM | POA: Diagnosis not present

## 2023-09-08 DIAGNOSIS — J302 Other seasonal allergic rhinitis: Secondary | ICD-10-CM | POA: Diagnosis not present

## 2023-09-08 DIAGNOSIS — Z79899 Other long term (current) drug therapy: Secondary | ICD-10-CM | POA: Diagnosis not present

## 2023-09-08 DIAGNOSIS — X58XXXA Exposure to other specified factors, initial encounter: Secondary | ICD-10-CM | POA: Diagnosis not present

## 2023-09-08 DIAGNOSIS — R0982 Postnasal drip: Secondary | ICD-10-CM | POA: Diagnosis not present

## 2023-09-08 DIAGNOSIS — S161XXA Strain of muscle, fascia and tendon at neck level, initial encounter: Secondary | ICD-10-CM | POA: Diagnosis not present

## 2023-11-20 DIAGNOSIS — R059 Cough, unspecified: Secondary | ICD-10-CM | POA: Diagnosis not present

## 2023-11-21 ENCOUNTER — Ambulatory Visit (HOSPITAL_COMMUNITY)
Admission: RE | Admit: 2023-11-21 | Discharge: 2023-11-21 | Disposition: A | Discharge status: Home / Self Care | Payer: PPO | Source: Ambulatory Visit | Attending: Family Medicine | Admitting: Family Medicine

## 2023-11-21 DIAGNOSIS — M79641 Pain in right hand: Secondary | ICD-10-CM | POA: Diagnosis not present

## 2023-11-21 DIAGNOSIS — M1811 Unilateral primary osteoarthritis of first carpometacarpal joint, right hand: Secondary | ICD-10-CM | POA: Diagnosis not present

## 2023-11-24 ENCOUNTER — Other Ambulatory Visit (HOSPITAL_COMMUNITY): Payer: Self-pay | Admitting: Nurse Practitioner

## 2023-11-24 DIAGNOSIS — R059 Cough, unspecified: Secondary | ICD-10-CM

## 2023-12-22 ENCOUNTER — Ambulatory Visit (HOSPITAL_COMMUNITY)
Admission: RE | Admit: 2023-12-22 | Discharge: 2023-12-22 | Disposition: A | Discharge status: Home / Self Care | Payer: PPO | Source: Ambulatory Visit | Attending: Nurse Practitioner | Admitting: Nurse Practitioner

## 2023-12-22 DIAGNOSIS — R059 Cough, unspecified: Secondary | ICD-10-CM | POA: Insufficient documentation

## 2024-01-02 DIAGNOSIS — J302 Other seasonal allergic rhinitis: Secondary | ICD-10-CM | POA: Diagnosis not present

## 2024-01-02 DIAGNOSIS — Z87891 Personal history of nicotine dependence: Secondary | ICD-10-CM | POA: Diagnosis not present

## 2024-01-02 DIAGNOSIS — R059 Cough, unspecified: Secondary | ICD-10-CM | POA: Diagnosis not present

## 2024-01-02 DIAGNOSIS — Z79899 Other long term (current) drug therapy: Secondary | ICD-10-CM | POA: Diagnosis not present

## 2024-01-02 DIAGNOSIS — J349 Unspecified disorder of nose and nasal sinuses: Secondary | ICD-10-CM | POA: Diagnosis not present

## 2024-01-02 DIAGNOSIS — J019 Acute sinusitis, unspecified: Secondary | ICD-10-CM | POA: Diagnosis not present

## 2024-01-21 DIAGNOSIS — R7303 Prediabetes: Secondary | ICD-10-CM | POA: Diagnosis not present

## 2024-01-21 DIAGNOSIS — R5383 Other fatigue: Secondary | ICD-10-CM | POA: Diagnosis not present

## 2024-01-27 DIAGNOSIS — Z6833 Body mass index (BMI) 33.0-33.9, adult: Secondary | ICD-10-CM | POA: Diagnosis not present

## 2024-01-27 DIAGNOSIS — E7801 Familial hypercholesterolemia: Secondary | ICD-10-CM | POA: Diagnosis not present

## 2024-01-27 DIAGNOSIS — R5383 Other fatigue: Secondary | ICD-10-CM | POA: Diagnosis not present

## 2024-01-27 DIAGNOSIS — K219 Gastro-esophageal reflux disease without esophagitis: Secondary | ICD-10-CM | POA: Diagnosis not present

## 2024-01-27 DIAGNOSIS — E669 Obesity, unspecified: Secondary | ICD-10-CM | POA: Diagnosis not present

## 2024-01-27 DIAGNOSIS — M25551 Pain in right hip: Secondary | ICD-10-CM | POA: Diagnosis not present

## 2024-01-27 DIAGNOSIS — M79673 Pain in unspecified foot: Secondary | ICD-10-CM | POA: Diagnosis not present

## 2024-01-27 DIAGNOSIS — I8391 Asymptomatic varicose veins of right lower extremity: Secondary | ICD-10-CM | POA: Diagnosis not present

## 2024-01-27 DIAGNOSIS — I10 Essential (primary) hypertension: Secondary | ICD-10-CM | POA: Diagnosis not present

## 2024-01-27 DIAGNOSIS — G4700 Insomnia, unspecified: Secondary | ICD-10-CM | POA: Diagnosis not present

## 2024-01-27 DIAGNOSIS — M545 Low back pain, unspecified: Secondary | ICD-10-CM | POA: Diagnosis not present

## 2024-01-27 DIAGNOSIS — R7303 Prediabetes: Secondary | ICD-10-CM | POA: Diagnosis not present

## 2024-05-21 DIAGNOSIS — T8522XD Displacement of intraocular lens, subsequent encounter: Secondary | ICD-10-CM | POA: Diagnosis not present

## 2024-05-21 DIAGNOSIS — Z9889 Other specified postprocedural states: Secondary | ICD-10-CM | POA: Diagnosis not present

## 2024-09-06 DIAGNOSIS — R7303 Prediabetes: Secondary | ICD-10-CM | POA: Diagnosis not present

## 2024-09-21 DIAGNOSIS — K5900 Constipation, unspecified: Secondary | ICD-10-CM | POA: Diagnosis not present

## 2024-09-21 DIAGNOSIS — H548 Legal blindness, as defined in USA: Secondary | ICD-10-CM | POA: Diagnosis not present

## 2024-09-21 DIAGNOSIS — I10 Essential (primary) hypertension: Secondary | ICD-10-CM | POA: Diagnosis not present

## 2024-09-21 DIAGNOSIS — R7303 Prediabetes: Secondary | ICD-10-CM | POA: Diagnosis not present

## 2024-09-21 DIAGNOSIS — E78019 Familial hypercholesterolemia, unspecified: Secondary | ICD-10-CM | POA: Diagnosis not present

## 2024-09-21 DIAGNOSIS — G4700 Insomnia, unspecified: Secondary | ICD-10-CM | POA: Diagnosis not present

## 2024-09-21 DIAGNOSIS — G720 Drug-induced myopathy: Secondary | ICD-10-CM | POA: Diagnosis not present

## 2024-09-21 DIAGNOSIS — E669 Obesity, unspecified: Secondary | ICD-10-CM | POA: Diagnosis not present

## 2024-09-21 DIAGNOSIS — J3489 Other specified disorders of nose and nasal sinuses: Secondary | ICD-10-CM | POA: Diagnosis not present

## 2024-09-21 DIAGNOSIS — Z6833 Body mass index (BMI) 33.0-33.9, adult: Secondary | ICD-10-CM | POA: Diagnosis not present

## 2024-09-21 DIAGNOSIS — M79641 Pain in right hand: Secondary | ICD-10-CM | POA: Diagnosis not present

## 2024-09-21 DIAGNOSIS — E66811 Obesity, class 1: Secondary | ICD-10-CM | POA: Diagnosis not present

## 2024-10-06 DIAGNOSIS — M1811 Unilateral primary osteoarthritis of first carpometacarpal joint, right hand: Secondary | ICD-10-CM | POA: Diagnosis not present

## 2024-11-19 ENCOUNTER — Ambulatory Visit: Admitting: Allergy & Immunology
